# Patient Record
Sex: Male | Born: 1935 | Race: White | Hispanic: No | Marital: Married | Smoking: Former smoker
Health system: Southern US, Community
[De-identification: ages and names within clinical notes are randomized; demographics above are authoritative.]

## PROBLEM LIST (undated history)

## (undated) DIAGNOSIS — H9190 Unspecified hearing loss, unspecified ear: Secondary | ICD-10-CM

## (undated) DIAGNOSIS — G4700 Insomnia, unspecified: Secondary | ICD-10-CM

## (undated) DIAGNOSIS — E039 Hypothyroidism, unspecified: Secondary | ICD-10-CM

## (undated) DIAGNOSIS — M1990 Unspecified osteoarthritis, unspecified site: Secondary | ICD-10-CM

## (undated) DIAGNOSIS — H269 Unspecified cataract: Secondary | ICD-10-CM

## (undated) DIAGNOSIS — R0602 Shortness of breath: Secondary | ICD-10-CM

## (undated) DIAGNOSIS — I480 Paroxysmal atrial fibrillation: Secondary | ICD-10-CM

## (undated) DIAGNOSIS — E785 Hyperlipidemia, unspecified: Secondary | ICD-10-CM

## (undated) DIAGNOSIS — I10 Essential (primary) hypertension: Secondary | ICD-10-CM

## (undated) DIAGNOSIS — J209 Acute bronchitis, unspecified: Secondary | ICD-10-CM

## (undated) DIAGNOSIS — I4892 Unspecified atrial flutter: Secondary | ICD-10-CM

## (undated) HISTORY — PX: BACK SURGERY: SHX140

## (undated) HISTORY — DX: Unspecified hearing loss, unspecified ear: H91.90

## (undated) HISTORY — DX: Acute bronchitis, unspecified: J20.9

## (undated) HISTORY — PX: COLON SURGERY: SHX602

## (undated) HISTORY — DX: Insomnia, unspecified: G47.00

## (undated) HISTORY — DX: Hyperlipidemia, unspecified: E78.5

## (undated) HISTORY — DX: Unspecified cataract: H26.9

---

## 1998-11-03 ENCOUNTER — Ambulatory Visit (HOSPITAL_COMMUNITY): Admission: RE | Admit: 1998-11-03 | Discharge: 1998-11-03 | Payer: Self-pay | Admitting: Gastroenterology

## 1998-11-04 ENCOUNTER — Ambulatory Visit (HOSPITAL_COMMUNITY): Admission: RE | Admit: 1998-11-04 | Discharge: 1998-11-04 | Payer: Self-pay | Admitting: Gastroenterology

## 1998-11-04 ENCOUNTER — Encounter: Payer: Self-pay | Admitting: Gastroenterology

## 1998-11-13 ENCOUNTER — Encounter: Payer: Self-pay | Admitting: Surgery

## 1998-11-15 ENCOUNTER — Inpatient Hospital Stay (HOSPITAL_COMMUNITY): Admission: RE | Admit: 1998-11-15 | Discharge: 1998-11-20 | Payer: Self-pay | Admitting: Surgery

## 1998-11-16 ENCOUNTER — Encounter: Payer: Self-pay | Admitting: Surgery

## 1999-10-29 HISTORY — PX: COLECTOMY: SHX59

## 1999-11-29 ENCOUNTER — Ambulatory Visit (HOSPITAL_COMMUNITY): Admission: RE | Admit: 1999-11-29 | Discharge: 1999-11-29 | Payer: Self-pay | Admitting: Gastroenterology

## 2002-12-28 ENCOUNTER — Ambulatory Visit (HOSPITAL_COMMUNITY): Admission: RE | Admit: 2002-12-28 | Discharge: 2002-12-28 | Payer: Self-pay | Admitting: Gastroenterology

## 2004-01-10 ENCOUNTER — Encounter: Admission: RE | Admit: 2004-01-10 | Discharge: 2004-01-10 | Payer: Self-pay | Admitting: Cardiology

## 2004-01-16 ENCOUNTER — Inpatient Hospital Stay (HOSPITAL_COMMUNITY): Admission: RE | Admit: 2004-01-16 | Discharge: 2004-01-18 | Payer: Self-pay | Admitting: Cardiology

## 2004-01-16 HISTORY — PX: CARDIOVERSION: SHX1299

## 2004-02-22 ENCOUNTER — Ambulatory Visit (HOSPITAL_COMMUNITY): Admission: RE | Admit: 2004-02-22 | Discharge: 2004-02-22 | Payer: Self-pay | Admitting: Cardiology

## 2005-09-13 ENCOUNTER — Ambulatory Visit (HOSPITAL_COMMUNITY): Admission: RE | Admit: 2005-09-13 | Discharge: 2005-09-13 | Payer: Self-pay | Admitting: Gastroenterology

## 2006-10-28 DIAGNOSIS — I4892 Unspecified atrial flutter: Secondary | ICD-10-CM

## 2006-10-28 HISTORY — PX: ATRIAL FLUTTER ABLATION: SHX5733

## 2006-10-28 HISTORY — DX: Unspecified atrial flutter: I48.92

## 2007-09-28 ENCOUNTER — Ambulatory Visit: Payer: Self-pay | Admitting: Internal Medicine

## 2007-09-28 ENCOUNTER — Inpatient Hospital Stay (HOSPITAL_COMMUNITY): Admission: EM | Admit: 2007-09-28 | Discharge: 2007-10-01 | Payer: Self-pay | Admitting: Emergency Medicine

## 2007-09-29 ENCOUNTER — Encounter (INDEPENDENT_AMBULATORY_CARE_PROVIDER_SITE_OTHER): Payer: Self-pay | Admitting: Cardiovascular Disease

## 2008-01-11 ENCOUNTER — Encounter: Admission: RE | Admit: 2008-01-11 | Discharge: 2008-01-11 | Payer: Self-pay | Admitting: Family Medicine

## 2008-10-12 ENCOUNTER — Ambulatory Visit: Payer: Self-pay | Admitting: Internal Medicine

## 2008-10-31 ENCOUNTER — Encounter: Admission: RE | Admit: 2008-10-31 | Discharge: 2008-10-31 | Payer: Self-pay | Admitting: Cardiology

## 2008-11-04 ENCOUNTER — Ambulatory Visit (HOSPITAL_COMMUNITY): Admission: RE | Admit: 2008-11-04 | Discharge: 2008-11-04 | Payer: Self-pay | Admitting: Cardiology

## 2008-11-04 HISTORY — PX: CARDIOVERSION: SHX1299

## 2009-03-22 IMAGING — CR DG CHEST 2V
2 series · 2 of 2 positions shown · non-contrast
Comparison: 01/10/2004

CLINICAL DATA: Shortness of breath, arrhythmia

CHEST - 2 VIEW:

[w chest pa]
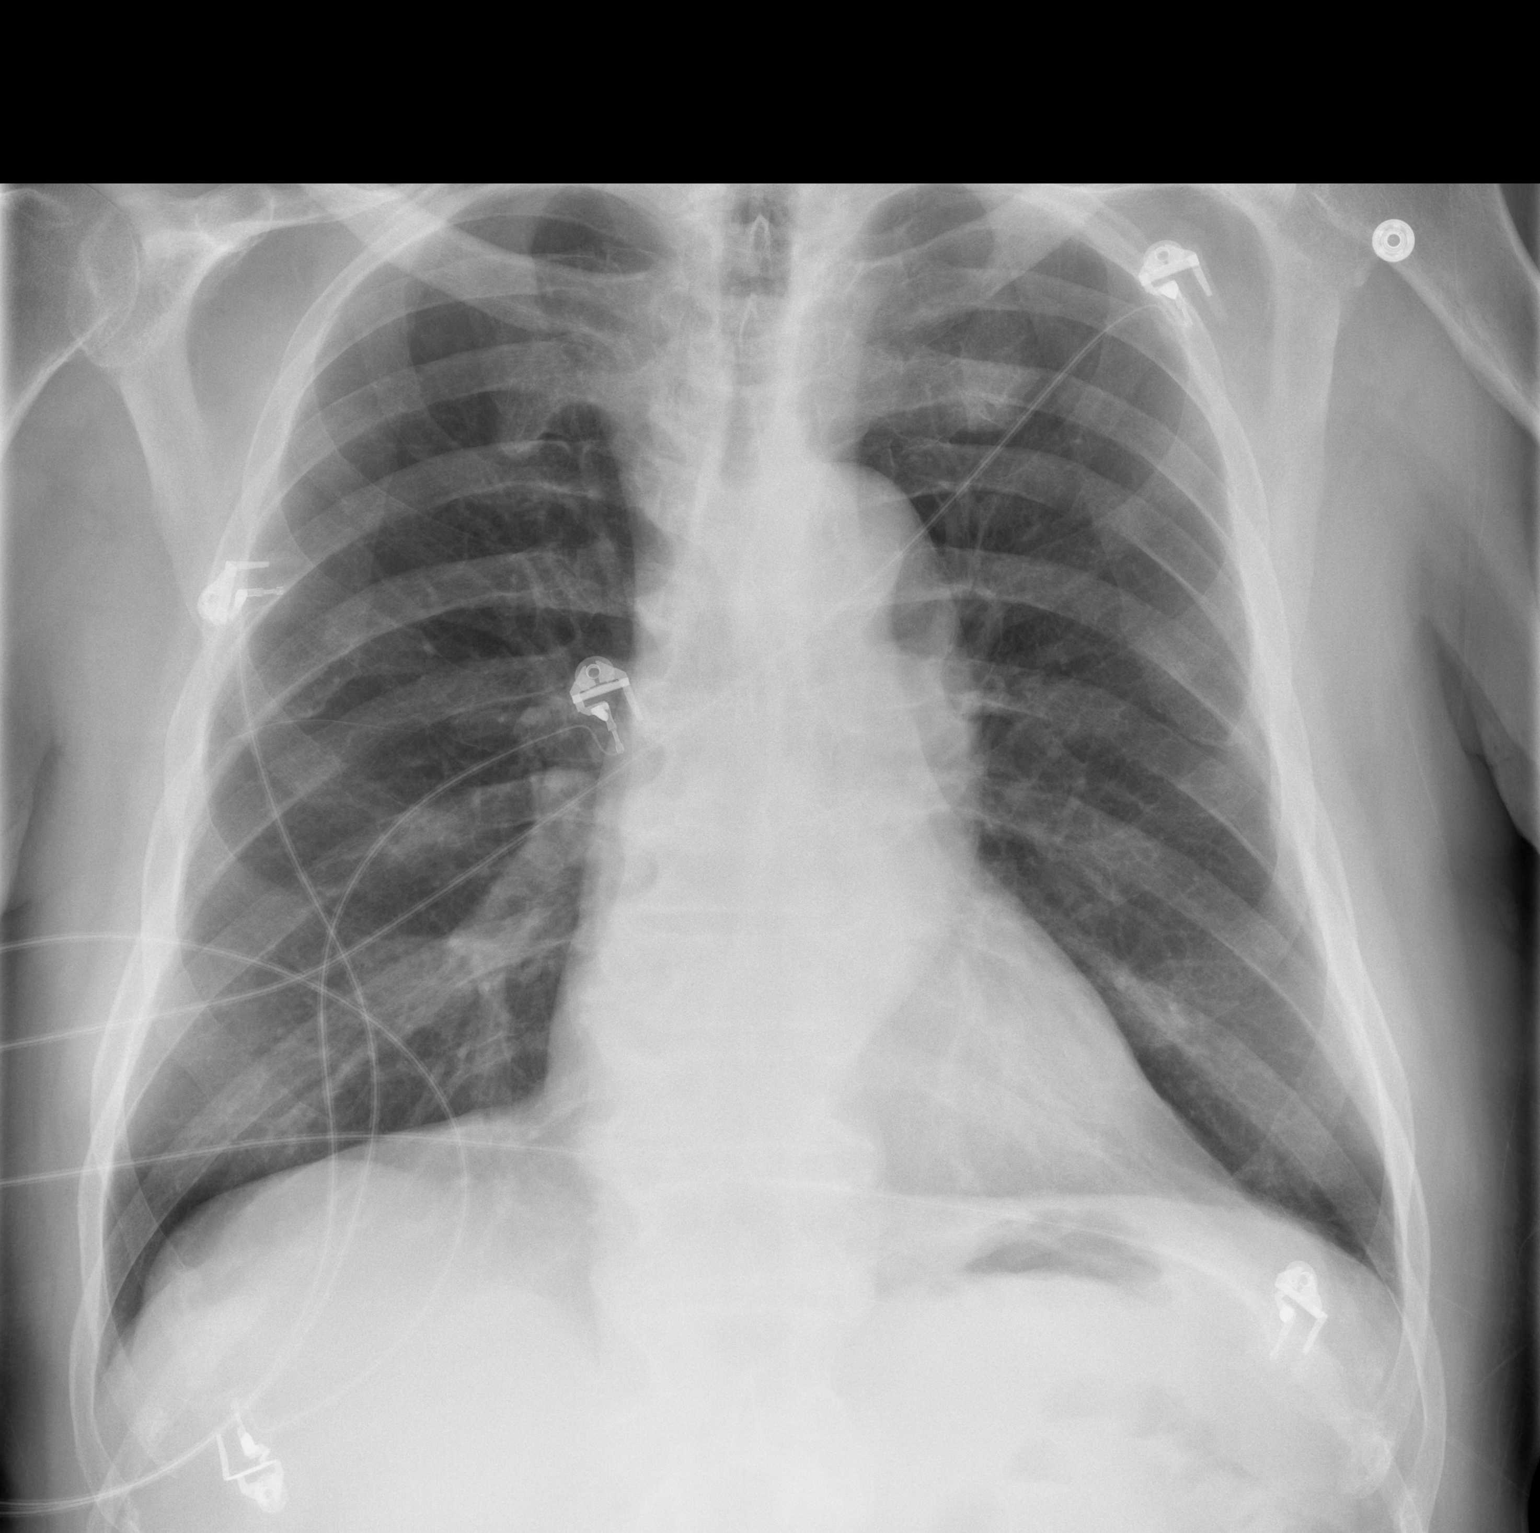

[w chest lat]
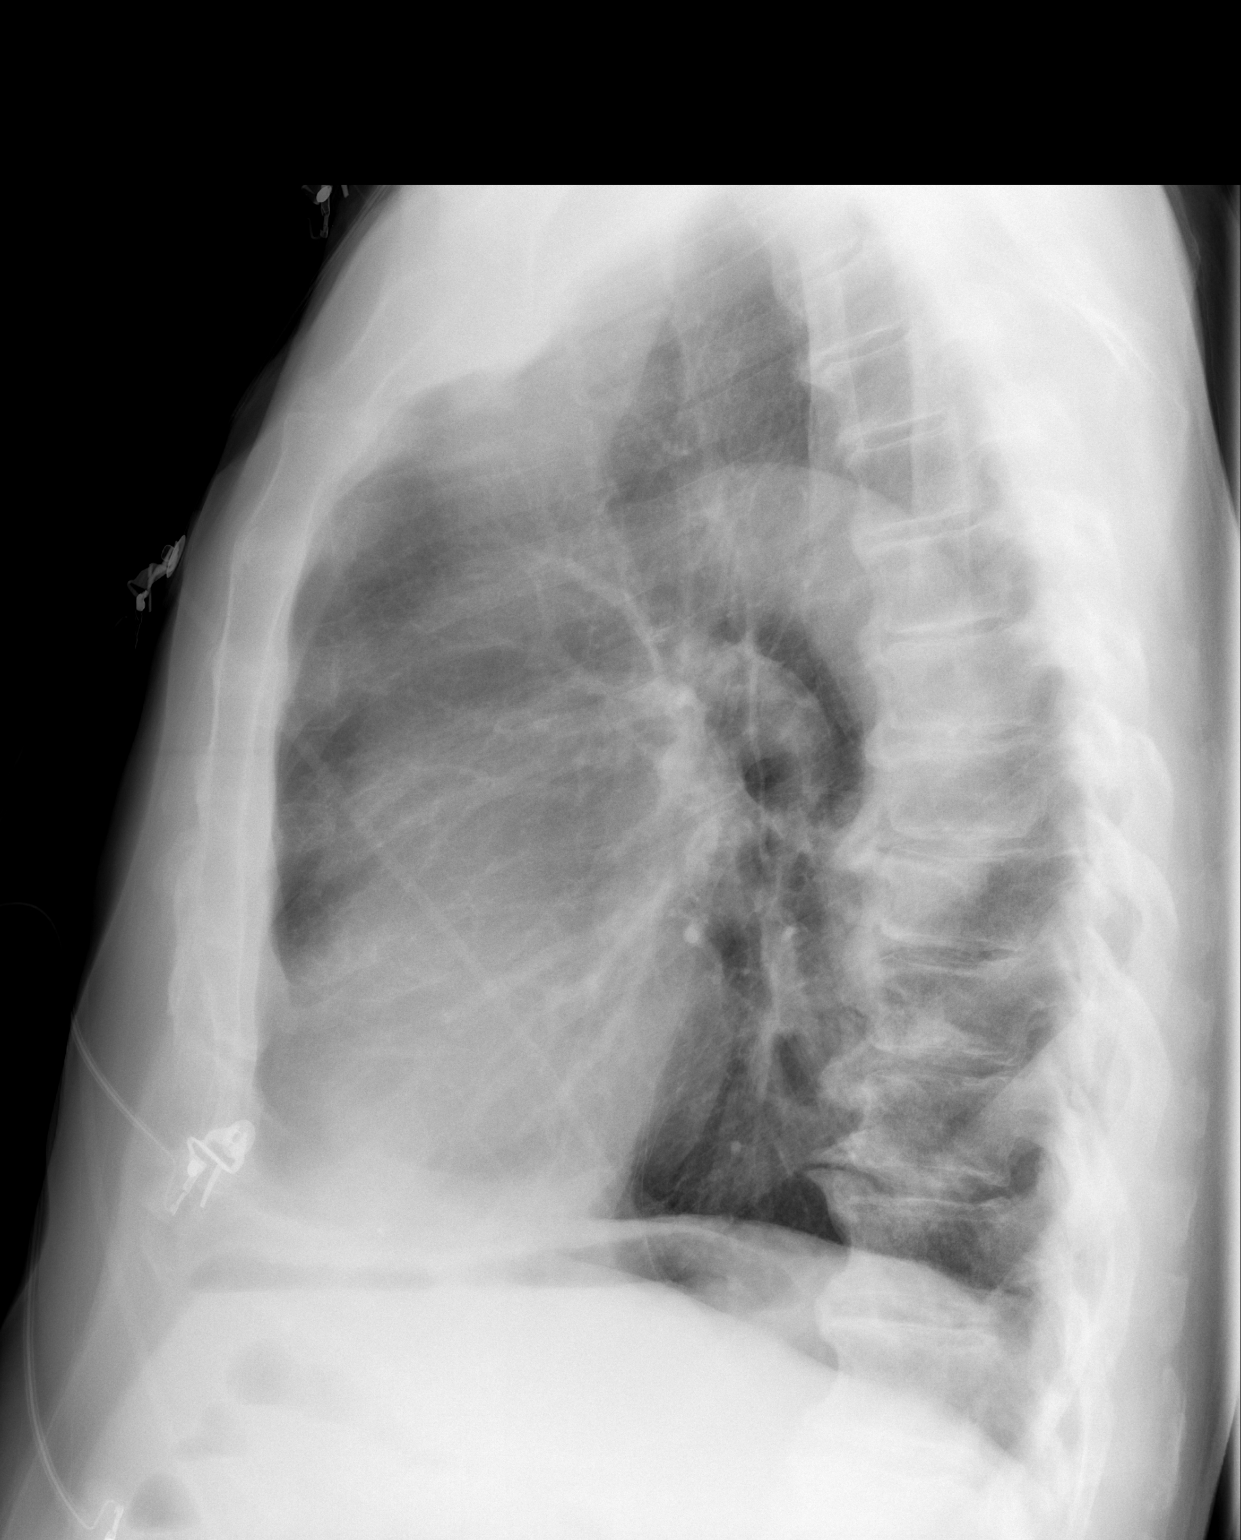

[2 of 2 positions shown; findings below may reference images not displayed]

FINDINGS: Heart is normal size. There is stable mild tortuosity of the thoracic
aorta. No focal opacities or effusions. Degenerative changes throughout the
thoracic spine.
IMPRESSION: No acute findings.

## 2009-10-28 HISTORY — PX: ATRIAL FIBRILLATION ABLATION: SHX5732

## 2010-11-20 ENCOUNTER — Ambulatory Visit: Payer: Self-pay | Admitting: Cardiology

## 2010-12-07 ENCOUNTER — Ambulatory Visit (HOSPITAL_COMMUNITY)
Admission: RE | Admit: 2010-12-07 | Discharge: 2010-12-07 | Disposition: A | Discharge status: Home / Self Care | Payer: Medicare Other | Source: Ambulatory Visit | Attending: Urology | Admitting: Urology

## 2010-12-07 ENCOUNTER — Ambulatory Visit (HOSPITAL_BASED_OUTPATIENT_CLINIC_OR_DEPARTMENT_OTHER)
Admission: RE | Admit: 2010-12-07 | Discharge: 2010-12-07 | Disposition: A | Discharge status: Home / Self Care | Payer: Medicare Other | Attending: Urology | Admitting: Urology

## 2010-12-07 ENCOUNTER — Other Ambulatory Visit (HOSPITAL_BASED_OUTPATIENT_CLINIC_OR_DEPARTMENT_OTHER): Payer: Self-pay | Admitting: Urology

## 2010-12-07 DIAGNOSIS — I10 Essential (primary) hypertension: Secondary | ICD-10-CM | POA: Insufficient documentation

## 2010-12-07 DIAGNOSIS — Z01818 Encounter for other preprocedural examination: Secondary | ICD-10-CM | POA: Insufficient documentation

## 2010-12-07 DIAGNOSIS — N401 Enlarged prostate with lower urinary tract symptoms: Secondary | ICD-10-CM | POA: Insufficient documentation

## 2010-12-07 DIAGNOSIS — R339 Retention of urine, unspecified: Secondary | ICD-10-CM | POA: Insufficient documentation

## 2010-12-07 DIAGNOSIS — Z01811 Encounter for preprocedural respiratory examination: Secondary | ICD-10-CM

## 2010-12-07 DIAGNOSIS — N320 Bladder-neck obstruction: Secondary | ICD-10-CM | POA: Insufficient documentation

## 2010-12-07 DIAGNOSIS — N138 Other obstructive and reflux uropathy: Secondary | ICD-10-CM | POA: Insufficient documentation

## 2010-12-07 DIAGNOSIS — Z01812 Encounter for preprocedural laboratory examination: Secondary | ICD-10-CM | POA: Insufficient documentation

## 2010-12-07 LAB — BASIC METABOLIC PANEL
BUN: 22 mg/dL (ref 6–23)
CO2: 26 mEq/L (ref 19–32)
Calcium: 9.6 mg/dL (ref 8.4–10.5)
Chloride: 101 mEq/L (ref 96–112)
Creatinine, Ser: 1.48 mg/dL (ref 0.4–1.5)
GFR calc Af Amer: 55 mL/min — ABNORMAL LOW (ref >60)
GFR calc non Af Amer: 45 mL/min — ABNORMAL LOW (ref >60)
Glucose, Bld: 118 mg/dL — ABNORMAL HIGH (ref 70–99)
Potassium: 3.9 mEq/L (ref 3.5–5.1)
Sodium: 134 mEq/L — ABNORMAL LOW (ref 135–145)

## 2010-12-07 LAB — GLUCOSE, CAPILLARY
Glucose-Capillary: 109 mg/dL — ABNORMAL HIGH (ref 70–99)
Glucose-Capillary: 118 mg/dL — ABNORMAL HIGH (ref 70–99)
Glucose-Capillary: 112 mg/dL — ABNORMAL HIGH (ref 70–99)

## 2010-12-07 LAB — POCT HEMOGLOBIN-HEMACUE: Hemoglobin: 11.3 g/dL — ABNORMAL LOW (ref 13.0–17.0)

## 2010-12-07 LAB — PROTIME-INR
INR: 0.96 (ref 0.00–1.49)
Prothrombin Time: 13 seconds (ref 11.6–15.2)

## 2010-12-20 NOTE — Op Note (Signed)
NAME:  Ronald Payne, Ronald Payne             ACCOUNT NO.:  [id number]  MEDICAL RECORD NO.:  [id number]           PATIENT TYPE:  O  LOCATION:  XRAY                         FACILITY:  [facility]  PHYSICIAN:  Azara Gemme C. Vernie Payne, M.D.  DATE OF BIRTH:  1923-08-02  DATE OF PROCEDURE:  12/07/2010 DATE OF DISCHARGE:                              OPERATIVE REPORT   PREOPERATIVE DIAGNOSIS:  Benign prostatic hypertrophy with bladder outlet obstruction and urinary retention.  POSTOPERATIVE DIAGNOSIS:  Benign prostatic hypertrophy with bladder outlet obstruction and urinary retention.  PROCEDURE:  Transurethral vaporization of the prostate.  SURGEON:  Lyndal Reggio C. Vernie Payne, M.D.  ANESTHESIA:  General.  SPECIMENS:  None.  ESTIMATED BLOOD LOSS:  Minimal.  DRAINS:  22-French three-way Foley catheter.  COMPLICATIONS:  None.  INDICATIONS:  The patient is an 77 year old male who initially was seen with urinary symptoms that had been present for several years and was found by urodynamics to have a normal cystometric capacity with some mild bladder instability but primarily outlet obstruction with an elevated postvoid residual of 225 cc.  He eventually developed urinary retention due to his outlet obstruction and we discussed treatment with a transurethral resection of the prostate.  I have gone over the procedure as well as its risks, complications and alternatives with the patient and he understands and has elected to proceed.  DESCRIPTION OF OPERATION:  After informed consent, the patient was brought to the major OR, placed on the table, administered general anesthesia and then moved to the dorsal lithotomy position.  His genitalia was then sterilely prepped and draped.  An official time-out was then performed.  Initially the 26-French resectoscope sheath with Timberlake obturator was introduced in the bladder and the obturator was removed.  The 12 degrees lens with resectoscope element was then inserted and  the bladder was fully and systematically inspected.  It was noted to have no tumor, stones or inflammatory lesions.  The 2+ trabeculation was identified. Ureteral orifices were identified and noted to be of normal position and configuration and well away from the bladder neck with no significant median lobe component present.  Transurethral vaporization was performed using the Gyrus generator as well as the button element and I vaporized from the level of the bladder neck back to the veru at the 6 o'clock position and then progressed in a counterclockwise position vaporizing the entire left lobe down to the surgical capsule and back to the veru.  This was performed then on the contralateral lobe of the prostate in a similar fashion.  Bleeding arterioles identified during the procedure were cauterized and then apical tissue was vaporized with care being taken to remain proximal to the veru at all times.  At the end of the procedure, the bladder was reinspected and found to be free of any perforation or injury.  The ureteral orifices were noted to be intact and well away from the bladder neck and the prostatic urethra was well resected and this was photographed.  I then cauterized any obvious venous bleeding points, as no further arterial bleeding points were identified and left the bladder full and removed the resectoscope.  In doing  this, a good flow was noted.  I then inserted the catheter, filled the balloon and connected this to closed system drainage as well as continuous irrigation at a slow rate with the irrigant remaining completely clear.  The patient was awakened and taken to the recovery room in stable and satisfactory condition.  He tolerated the procedure well and there were no intraoperative complications.  He will be observed overnight and with anticipation of discharge in the morning and hold his Coumadin for 48 hours and then restart that at home.     Ronald Payne, M.D.     MCO/MEDQ  D:  12/07/2010  T:  12/07/2010  Job:  [id number]  Electronically Signed by Ronald Gully M.D. on 12/20/2010 05:54:45 PM

## 2011-02-11 LAB — PROTIME-INR: Prothrombin Time: 40.2 seconds — ABNORMAL HIGH (ref 11.6–15.2)

## 2011-02-11 LAB — GLUCOSE, CAPILLARY: Glucose-Capillary: 123 mg/dL — ABNORMAL HIGH (ref 70–99)

## 2011-03-12 NOTE — Op Note (Signed)
Ronald Payne, Ronald NO.:  [id number]   MEDICAL RECORD NO.:  [id number]          PATIENT TYPE:  INP   LOCATION:  2008                         FACILITY:  [facility]   PHYSICIAN:  Ronald Canning. Ladona Ridgel, MD    DATE OF BIRTH:  [birth year]   DATE OF PROCEDURE:  09/30/2007  DATE OF DISCHARGE:                               OPERATIVE REPORT   PROCEDURE PERFORMED:  Electrophysiologic study and radiofrequency  catheter ablation of atrial flutter.   I. INTRODUCTION:  The patient is an 77 year old male with a history of  symptomatic atrial flutter as well as a history of atrial fibrillation,  who was admitted to the hospital with worsening atrial flutter and a  rapid ventricular response.  He spontaneously returned to sinus rhythm  and is now referred for catheter ablation.   II. PROCEDURE:  After informed was obtained, the patient was taken to  the diagnostic EP lab in the fasting state.  After the usual preparation  and draping, intravenous fentanyl and midazolam were given for sedation.  A 6-French Hexapolar catheter was inserted percutaneously in the right  jugular vein and advanced into the coronary sinus under fluoroscopic  guidance.  A 7-French 20-pole halo catheter was inserted percutaneously  in the right femoral vein and advanced into the right atrium under  fluoroscopic guidance.  A 5-French quadripolar catheter was inserted  percutaneously in the right femoral vein and advanced to the His bundle  region under fluoroscopic guidance.  After measurement of the basic  intervals, rapid atrial pacing was carried out from the coronary sinus  and stepwise decreased down to 400 milliseconds, where A-V Wenckebach  was observed.  Next, programmed atrial stimulation was carried out from  the coronary sinus and the high right atrium at basic drive cycle  lengths of 161 milliseconds.  The S1-S2 interval was stepwise decreased  down to 280 milliseconds, where the atrial refractoriness  was observed.  During programmed atrial stimulation, there were no A-H jumps and no  inducible SVT.  Next, additional rapid atrial pacing was carried out  from the coronary sinus at 290 milliseconds and stepwise decreased down  to 230 milliseconds, where atrial flutter was induced.  Mapping of the  atrial flutter demonstrated typical counterclockwise tricuspid annular  reentry.  The 7-French quadripolar ablation catheter was then inserted  percutaneously in the right femoral vein and advanced into the right  atrium.  Additional mapping was then carried out under fluoroscopic  guidance.  Two RF energy applications were then delivered to the atrial  flutter isthmus, resulting in termination of atrial flutter and  restoration of sinus rhythm.  There appeared to be isthmus block.  A  bonus RF energy application was then delivered and the patient was  observed for approximately 30 minutes during this time; there was no  additional atrial flutter isthmus conduction.  During this time, rapid  ventricular pacing was carried out from the RV apex, demonstrating V-A  dissociation.  Programmed ventricular stimulation was carried out from  the RV apex, demonstrating V-A dissociation.  The catheters were then  removed, hemostasis was assured and the  patient was returned to his room  in satisfactory condition.   III. COMPLICATIONS:  There were no immediate procedural complications.   IV. RESULTS:  A.  Baseline ECG:  The baseline ECG demonstrates sinus  rhythm with normal axis and intervals.  B.  Baseline intervals:  The sinus node cycle length was 1000  milliseconds.  The QRS duration was 117 milliseconds.  The H-V interval  was 40 milliseconds.  C.  Rapid ventricular pacing:  Rapid ventricular pacing was carried out  following ablation, demonstrating V-A dissociation.  D.  Programmed ventricular stimulation:  Programmed ventricular  stimulation was carried out following ablation, demonstrating V-A   dissociation.  E.  Rapid atrial pacing:  Rapid atrial pacing was carried out from the  coronary sinus and the high right atrium at pacing cycle length of 500  milliseconds and stepwise decreased down to 400 milliseconds, where A-V  Wenckebach was observed.  Additional decrements down to 220 milliseconds  resulted in the initiation of atrial flutter.  F.  Programmed atrial stimulation:  Programmed atrial stimulation was  carried out from the coronary sinus and the high right atrium at basic  drive cycle length of 161 milliseconds.  The S1-S2 interval was stepwise  decreased down to 280 milliseconds, where atrial refractoriness was  observed.  During programmed atrial stimulation, there were no A-H jumps  and no echo beats noted.  G.  Arrhythmias observed:  1. Atrial flutter.  Initiation:  Rapid atrial pacing.  Duration was      sustained.  Termination was with catheter ablation.  Cycle length      was 260 milliseconds.      a.     Mapping:  Mapping demonstrated typical counterclockwise       tricuspid annular reentrant atrial flutter.  The atrial flutter       isthmus was about average size.      b.     RF energy application:  A total of 3 RF energy applications       were delivered at the usual atrial flutter isthmus, resulting in       termination of atrial flutter, restoration of sinus rhythm,       creation of bidirectional block in the atrial flutter isthmus.      Ronald Canning. Ladona Ridgel, MD  Electronically Signed     GWT/MEDQ  D:  09/30/2007  T:  09/30/2007  Job:  [id number]   cc:   Thereasa Solo. Little, M.D.  Mosetta Putt, M.D.

## 2011-03-12 NOTE — Discharge Summary (Signed)
Ronald Payne, Ronald Payne             ACCOUNT NO.:  [id number]   MEDICAL RECORD NO.:  [id number]          PATIENT TYPE:  INP   LOCATION:  2008                         FACILITY:  [facility]   PHYSICIAN:  Richard A. Alanda Amass, M.D.DATE OF BIRTH:  11-Dec-1922   DATE OF ADMISSION:  09/28/2007  DATE OF DISCHARGE:  10/01/2007                               DISCHARGE SUMMARY   [name] is an 77 year old white married male patient who was admitted  from the office after being seen secondary to atrial flutter with rapid  ventricular response, symptomatic with complaints of shortness of  breath, inability to sleep because of his tachy palpitations.  He was  admitted.  He was put on IV amiodarone.  He had had a prior history of  PA fib or flutter in the past with history of remote DC cardioversion in  2005.  He has been on amiodarone since.  He was put on an IV amiodarone  drip when he was admitted.  EP consult was called for possible ablation.  He was seen by Dr. Graciela Husbands.  He went on to have an ablation done on  September 29, 2007.  Procedure was actually done by Dr. Ladona Ridgel.  It was  successful.  Post procedure, he had no problems, no complications.  On  October 01, 2007, he was seen by Dr. Alanda Amass.  Apparently Dr. Ladona Ridgel  decided to keep him on his amiodarone.  He was considered stable for  discharge home.  He did have some problems with of intermittent  hematuria, thus a PSA was ordered prior to his discharge, which will be  followed up as an outpatient.  We will also need to do a repeat UA as an  outpatient in the future.  His Coumadin was therapeutic with an INR of  2.6 on October 01, 2007.   LABORATORY DATA:  Hemoglobin 10.2, hematocrit 29.4 platelets 211.  Sodium 135, potassium 3.9, chloride 101, CO2 26, BUN 16, creatinine  1.49, glucose 126. Coags with INR 2.6, admission was 2.6.  He was  therapeutic throughout his hospitalization.  AST was 21, ALT was 18,  albumin was 3.6.  CK-MB and troponin  were negative.  Total cholesterol  is 128, LDL was 72, HDL was 46, triglycerides were 50.  Calcium 9.7.  UA  was negative.  Iron 64, TIBC was 248, percent saturation was 26.  Free  T4 was 2.29, T3 total was 118.6.  Vitamin B12 was 369.  Glycosylated  hemoglobin was 6.2.  TSH was low at 0.024.  It appears that the possibly  somewhere around September, he had been placed on Levoxyl by his primary  care doctor.  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  In the hospital here, with his TSH being low, his  thyroid medication was held and it was decided at the time of discharge  to again lower the dosage which it had been lowered by his primary care  physician as an outpatient.  The patient cannot remember the last time  it had been lowered.   DISCHARGE MEDICATIONS:  1. Lovastatin 40 mg every  day.  2. Finasteride 5 mg every day.  3. Cozaar 25 mg every day.  4. Metformin 850 mg twice a day.  5. Amiodarone 200 mg a day.  6. Coumadin 5 mg Monday, Wednesday, Friday and Saturday.  7. Coumadin 2.5 mg Tuesday, Thursday, and Sunday.  8. Chlorthalidone 12.5 mg a day.  9. Levoxyl 25 mg a day.  10.Colace three times a day.  11.Ferrous sulfate 324 mg a day.   DISCHARGE DIAGNOSES:  1. Atrial flutter with rapid ventricular response.  2. Status post right femoral artery ablation.  3. Normal ejection fraction with an ejection fraction of 55%.  4. Anticoagulation long term secondary to his PA fibrillation/flutter.  5. Non-insulin-dependent diabetes mellitus.  6. 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  7. Benign prostatic hypertrophy.  8. Anemia, currently on iron.  9. Hematuria, PSA ordered in the hospital, results pending at the time      of discharge.  His urinalysis was negative.  Both will need to be      followed up as an outpatient. He was told not to do any heavy      lifting, strenuous activity for a week, and he will follow up with      Dr.  Ladona Ridgel and Dr. Clarene Duke as an outpatient.      Lezlie Octave, N.P.      Richard A. Alanda Amass, M.D.  Electronically Signed    BB/MEDQ  D:  10/01/2007  T:  10/01/2007  Job:  [id number]   cc:   Mosetta Putt, M.D.  Doylene Canning. Ladona Ridgel, MD

## 2011-03-12 NOTE — Cardiovascular Report (Signed)
NAME:  Ronald Payne, LIPS NO.:  [id number]   MEDICAL RECORD NO.:  [id number]          PATIENT TYPE:  OIB   LOCATION:  2899                         FACILITY:  [facility]   PHYSICIAN:  Thereasa Solo. Little, M.D. DATE OF BIRTH:  1923-05-28   DATE OF PROCEDURE:  11/04/2008  DATE OF DISCHARGE:  11/04/2008                            CARDIAC CATHETERIZATION   INDICATIONS FOR TEST:  This 77 year old male has been in atrial  fibrillation since late November.  He has complained of tiredness, some  fatigue, and a substantial drop in his exercise tolerance.  His left  atrial dimension is 4.4 cm.  His ejection fraction is about 45%.  He had  a negative nuclear study, December 2009.  He has pulmonary artery  pressure of 30.  His INR today is 3.7.   After obtaining informed consent, the patient was given 120 mg of IV  Pentothal.  After appropriate level of anesthesia was obtained, he was  cardioverted electively with a 120 watt second using anterior-posterior  pads.  He converted to a sinus rhythm with a rate of 58.   He will be discharged to home later today, follow up in my office on  Monday.  He will be changed from amiodarone to dronedarone.           ______________________________  Thereasa Solo. Little, M.D.     ABL/MEDQ  D:  11/04/2008  T:  11/05/2008  Job:  [id number]   cc:   Mosetta Putt, M.D.  Catheterization Laboratory  Gaspar Garbe B. Little, M.D.

## 2011-03-12 NOTE — Letter (Signed)
October 12, 2008    Thereasa Solo. Little, MD  [address]  [address]  [place], [place] [address]   RE:  Ronald, Payne  MRN:  [id number]  /  DOB:  07/02/1923   Dear Dr. Clarene Duke:   It was my pleasure to see your patient, Ronald Payne, in  electrophysiology consultation today regarding therapeutic strategies  for atrial fibrillation.  As you are aware, Ronald Payne is a very  pleasant 77 year old gentleman with a history of persistent atrial  fibrillation, diabetes, and hypertension.  He was initially diagnosed  with atrial fibrillation in 2005.  He was initiated on amiodarone at  that time.  He did well initially, but subsequently developed thyroid  disease.  His amiodarone was therefore discontinued.  He was  cardioverted in January 26, 2004, but maintains sinus rhythm thereafter.  He subsequently developed atrial flutter and underwent cava tricuspid  isthmus ablation by Lewayne Bunting, September 30, 2007.  He reports doing  well until September 2009 when he developed symptoms of progressive  shortness of breath and dyspnea with moderate activities.  He was  evaluated again in your office and found to have returned to atrial  fibrillation.  He has been managed initially with a rate control  strategy.  It appears that his heart rate has been well controlled.  A  recent Holter monitor placed by you recorded atrial fibrillation with  heart rates between 40 and 100 beats per minute with an average heart  rate of 74 beats per minute.  At home, the patient finds that his heart  rate is typically between 60 and 80 beats per minute.  He was  subsequently initiated on amiodarone in mid August.  He is chronically  anticoagulated with Coumadin and reports therapeutic INRs.  He has not  converted to sinus rhythm despite amiodarone therapy and continues to  have difficulties with decreased exercise tolerance and fatigue.  He was  evaluated by Sherren Kerns at [facility] several months ago, who  recommended  medical therapy in long term.  He now presents for second  opinion regarding therapeutic strategies of atrial fibrillation.   PAST MEDICAL HISTORY:  1. Persistent atrial fibrillation (as above).  2. Atrial flutter status post cava tricuspid isthmus ablation by Lewayne Bunting December 2008.  3. Hypertension.  4. Hyperlipidemia.  5. Diabetes mellitus.  6. Hypothyroidism.  7. Status post partial colectomy.  8. Benign prostatic hypertrophy.  9. Status post cataract surgery.   Transthoracic echocardiogram performed July 21, 2008, revealed a  left ventricular ejection fraction of 35-45% with a left ventricular end-  diastolic dimension of 51 cm and a left atrial size of 44 cm.  Mild-to-  moderate mitral regurgitation was also noted with a right ventricular  systolic pressure of 30-40 mmHg.   Lexiscan Myoview October 03, 2008, reveals normal perfusion except for  diaphragmatic attenuation.  This study was felt to be in low risk.   CURRENT MEDICATIONS:  1. Lovastatin 40 mg daily.  2. Finasteride 5 mg daily.  3. Metformin 500 mg b.i.d.  4. Omeprazole 40 mg daily.  5. Levoxyl 50 mcg daily.  6. Colace 100 mg t.i.d.  7. Chlorthalidone 12.5 mg daily.  8. Digoxin 0.125 mg daily.  9. Carvedilol 12.5 mg b.i.d.  10.Amiodarone 200 mg daily.  11.Coumadin to maintain an INR between 2-3.  12.Temazepam 15 mg p.r.n.   ALLERGIES:  No known drug allergies.   SOCIAL HISTORY:  The patient lives in  [place] with his wife.  He  retired previously from [employer] and subsequently from a company in  [place] in [place].  He smoked cigars but quit 2 years ago.  He  denies alcohol or drug use.   FAMILY HISTORY:  Notable for coronary artery disease.   REVIEW OF SYSTEMS:  All systems are reviewed and negative except as  outlined in the HPI above.   PHYSICAL EXAMINATION:  VITAL SIGNS:  Blood pressure 136/80, heart rate  74, respirations 18, weight 209 pounds.  GENERAL:  The patient is  an elderly male in no acute distress.  He is  alert and oriented x3.  HEENT:  Normocephalic, atraumatic.  Sclerae are clear.  Conjunctivae  pink.  Oropharynx clear.  NECK:  Supple.  No JVD, lymphadenopathy, or bruits.  LUNGS:  Clear to auscultation bilaterally.  HEART:  Irregularly irregular rhythm.  No murmurs, rubs, or gallops.  GI:  Soft, nontender, nondistended.  Positive bowel sounds.  EXTREMITIES:  No clubbing, cyanosis, trace lower extremity edema  bilaterally.  NEUROLOGIC:  Strength and sensation are intact.  SKIN:  No ecchymosis or lacerations.  MUSCULOSKELETAL:  No deformity or atrophy.  PSYCH:  Euthymic mood, full affect.   EKG atrial fibrillation with a ventricular rate of 75 beats per minute  with nonspecific ST/T-wave changes.   IMPRESSION:  Ronald Payne is a very pleasant 77 year old gentleman with  symptomatic persistent atrial fibrillation who presents today for EP  consultation.  The patient appears to have symptoms of decreased  exercise capacity and fatigue secondary to atrial fibrillation.  His  heart rate is well controlled.  He is currently anticoagulated with  Coumadin.   PLAN:  Therapeutic strategies for atrial fibrillation including both  medicine and catheter-based therapies were discussed in detail with the  patient today.  Risks, benefits, and alternatives to EP study and  radiofrequency ablation were also discussed in detail.  These risks  include but are not limited to stroke, bleeding, vascular damage,  pericardial effusion with tamponade, myocardiac perforation, renal  failure; damage to the esophagus, lungs, and surrounding structures;  pulmonary vein stenosis; and death.  The patient also understands that  his risks for vascular damage and cardiac perforation are likely  increased due to his age.  At the present time, I think that our  strategy should continue to be medical therapy.  However, should the  patient fail medical therapy and wish to  accept the risks of catheter  ablation, then we could consider catheter ablation after medical  therapies have been exhausted.  I think that we should plan for DC  cardioversion in the next few days, as the patient has been adequately  loaded with amiodarone since September.  Amiodarone is our strongest  antiarrhythmic regarding therapy for atrial fibrillation.  If we are  able to maintain sinus rhythm with amiodarone, then I think that this  would be a reasonable strategy long term.  However, should the patient  have difficulties with side effects of amiodarone, then I think that we  should consider dronedarone therapy.  Dronedarone has been studied in  the elderly population and appears to have the same safety profile as in  younger patients.  In [place], the patient ranged in age from 23-97 and  42% of the patient's were 75 years of age or older.  In [place] and  [place], the patient ranged in age from 76-48 years of age.  To date, no  increased adverse events have been found in  this patient population with  dronedarone therapy.  I have again stressed the importance of long-term  anticoagulation with Coumadin with the patient today.   Thank you for the opportunity of participating in the care of Mr.  Loberg.  Please feel free to contact me if you wish to discuss his care  further.    Sincerely,      Hillis Range, MD  Electronically Signed    JA/MedQ  DD: 10/12/2008  DT: 10/13/2008  Job #: [id number]   CC:    Bertram Millard. Hyacinth Meeker, M.D.

## 2011-03-15 NOTE — Cardiovascular Report (Signed)
NAME:  Ronald Payne, Ronald Payne                       ACCOUNT NO.:  [id number]   MEDICAL RECORD NO.:  [id number]                   PATIENT TYPE:  OIB   LOCATION:  2857                                 FACILITY:  [facility]   PHYSICIAN:  Thereasa Solo. Little, M.D.              DATE OF BIRTH:  02/28/1923   DATE OF PROCEDURE:  01/16/2004  DATE OF DISCHARGE:                              CARDIAC CATHETERIZATION   HISTORY OF PRESENT ILLNESS:  Mr. Thieme is a 77 year old male who has  hypertension and diabetes. He has had atrial fibrillation since January and  has been on anticoagulants since that time. He has had a relatively well  controlled ventricular response with a therapeutic INR for greater than 3 to  4 weeks. He was brought in for outpatient cardioversion.   DESCRIPTION OF PROCEDURE:  With anesthesia's assistance, the patient was  anesthetized using Pentothal. Initial blood pressure 130/998, pulse 107.  Using anterior, posterior pads, 200 watt seconds, 150 watt seconds, and 200  watt seconds resulted in the patient staying in atrial fibrillation. I  repositioned the anterior patch. An additional 200 watt seconds was  delivered, still remaining in atrial fibrillation. Paddles were used at this  point, 120 watt seconds atrial fibrillation, 200 watt seconds still atrial  fibrillation. After 6 attempts, the patient was still in atrial  fibrillation. At this point, I have made the decision to hospitalize him,  load him with IV Amiodarone and will try to bring him back in about a month  for cardioversion on Amiodarone. His left atrial dimension is dilated at 4.9  cm. He has left ventricular hypertrophy with a 40% ejection fraction.   RECOMMENDATIONS:  It may be that with his left atrial dilatation, we may not  be able to maintain sinus rhythm in this patient. However, will give every  attempt with Amiodarone. I have also ordered repeat TSH and pulmonary  function studies, with and without bronchodilators  and effusion capacities.                                               Thereasa Solo. Little, M.D.    ABL/MEDQ  D:  01/16/2004  T:  01/18/2004  Job:  [id number]   cc:   Mosetta Putt, M.D.  [address] [place]  [place] [address]  Fax: [phone]

## 2011-03-15 NOTE — Op Note (Signed)
NAME:  Ronald Payne, Ronald Payne             ACCOUNT NO.:  [id number]   MEDICAL RECORD NO.:  [id number]          PATIENT TYPE:  AMB   LOCATION:  ENDO                         FACILITY:  [facility]   PHYSICIAN:  James L. Malon Kindle., M.D.DATE OF BIRTH:  1923/04/19   DATE OF PROCEDURE:  09/13/2005  DATE OF DISCHARGE:                                 OPERATIVE REPORT   PROCEDURE:  Esophagogastroduodenoscopy.   MEDICATIONS GIVEN:  Cetacaine spray, Fentanyl 20 mcg, Versed 4 mg IV.   INDICATIONS FOR PROCEDURE:  Anemia, heme positive stool.   DESCRIPTION OF PROCEDURE:  The procedure was explained to the patient and  consent obtained.  With the patient in the left lateral decubitus position,  the Olympus scope was inserted and advanced.  The stomach was entered, the  pylorus identified and passed.  The duodenum including the bulb and second  duodenum were seen well and were unremarkable.  The scope was withdrawn back  into the stomach and pyloric channel.  The antrum and body were seen well  and were normal.  The fundus and cardia were seen on the retroflex view and  were normal.  The scope was withdrawn.  There were several prominent nodular  areas in the esophagus that appeared submucosa.  No other abnormalities were  seen.  No ulceration or inflammation.  The oropharynx was normal.  The scope  was withdrawn.  The patient tolerated the procedure well.   ASSESSMENT:  Anemia and heme positive stool with negative endoscopy, 280.0,  792.1.   PLAN:  Proceed with colonoscopy at this time.           ______________________________  Llana Aliment Malon Kindle., M.D.     Waldron Session  D:  09/13/2005  T:  09/13/2005  Job:  [id number]   cc:   Mosetta Putt, M.D.  Fax: [phone]   Thereasa Solo. Little, M.D.  Fax: [phone]

## 2011-03-15 NOTE — Discharge Summary (Signed)
NAME:  Ronald Payne, STEMM                       ACCOUNT NO.:  [id number]   MEDICAL RECORD NO.:  [id number]                   PATIENT TYPE:  INP   LOCATION:  4729                                 FACILITY:  [facility]   PHYSICIAN:  Thereasa Solo. Little, M.D.              DATE OF BIRTH:  [birth year]   DATE OF ADMISSION:  01/16/2004  DATE OF DISCHARGE:  01/18/2004                                 DISCHARGE SUMMARY   ADMISSION DIAGNOSES:  1. Atrial fibrillation.  2. Hypertension.  3. Adult onset diabetes mellitus, non-insulin dependent.   DISCHARGE DIAGNOSES:  1. Atrial fibrillation.  2. Hypertension.  3. Adult onset diabetes mellitus, non-insulin dependent.   PROCEDURE:  1. Attempted cardioversion January 16, 2004.  2. Amiodarone loading in hospital.   HISTORY OF PRESENT ILLNESS:  Briefly, the patient is a 77 year old white  male with new atrial fibrillation with onset January 2005.  Echocardiogram  showed a left atrium at 4.9 cm with ejection fraction of 40%.  The patient  was anticoagulated and underwent attempted cardioversion on the day of  admission.  As an outpatient he failed and was subsequently admitted after  six shock attempts failed to convert him.  He was admitted for loading of  amiodarone.   PAST MEDICAL HISTORY:  Includes hypertension, adult onset diabetes mellitus,  partial bowel resection for polyps.   ALLERGIES:  None.   SOCIAL HISTORY:  TOBACCO:  None.  ETOH:  None.   CURRENT MEDICATIONS:  1. Cozaar 50 mg b.i.d.  2. Lasix 40 mg daily.  3. Glucophage 850 mg b.i.d.  4. Proscar 5 mg daily.  5. Lovastatin 10 mg daily.  6. Atenolol 50 mg 1.5 tablets daily.   For further history and physical examination findings please see the  hospital note.   HOSPITAL COURSE:  The patient was admitted as an outpatient and Dr. Clarene Duke  attempted to cardiovert him after six shocks going from 120 to 200 watt  seconds.  He was unable to cardiovert the patient.  The patient was  subsequently admitted and underwent amiodarone loading.  He has had no  problems and was subsequently discharged home on January 18, 2004.   DISCHARGE MEDICATIONS:  1. Atenolol 50 mg 1.5 tablets daily.  2. Coumadin 5 mg daily on Monday, Wednesday, Thursday, Saturday and Sunday,     2.5 mg on Friday and Tuesday.  3. Amiodarone 200 mg two tablets daily.  4. Cozaar 50 mg daily.  5. Lasix 20 mg daily.  6. Glucophage 850 mg one twice a day.  7. Lovastatin 10 mg daily.  8. Mag-OX 400 mg one twice daily.  9. Proscar 5 mg daily.   FOLLOW UP:  Patient is scheduled to have an INR check on the following  Tuesday and the patient is to return to the office on January 30, 2004 to see  Dr. Clarene Duke with plans to reduce his amiodarone dose on his office visit at  that time.  During his hospitalization PFT's were found to be normal and his  TSH was normal.      Eber Hong, P.A.                 Thereasa Solo. Little, M.D.    WDJ/MEDQ  D:  03/01/2004  T:  03/02/2004  Job:  [id number]

## 2011-03-15 NOTE — H&P (Signed)
NAME:  Ronald Payne, Ronald Payne                       ACCOUNT NO.:  [id number]   MEDICAL RECORD NO.:  [id number]                   PATIENT TYPE:  OIB   LOCATION:  4729                                 FACILITY:  [facility]   PHYSICIAN:  Thereasa Solo. Little, M.D.              DATE OF BIRTH:  08/23/1923   DATE OF ADMISSION:  01/16/2004  DATE OF DISCHARGE:                                HISTORY & PHYSICAL   CHIEF COMPLAINT:  Atrial fibrillation.   BRIEF HISTORY:  The patient is a 77 year old white male, a medical patient  of Dr. Mosetta Putt, who was referred in January, 2005, with new onset  atrial fibrillation.  Workup included 2D echo which showed a left atrium of  4.9 cm, EF of 40%, otherwise, normal.  He was anticoagulated and was  admitted today for cardioversion. The patient underwent six attempts at  cardioversion which failed.  He has been anticoagulated since January, 2005,  and it was Dr. Fredirick Maudlin opinion that he should be admitted for amiodarone  loading and further treatment as indicated.   PAST MEDICAL HISTORY:  1. Hypertension.  2. Adult onset diabetes mellitus.  3. He had a partial bowel resection a number of years ago for polyps.   ALLERGIES:  None known.   TOBACCO:  He smoked cigars from age 60 up until about 1 month ago.   ETOH:  Heavy use in the [employer].  He is a retired Manufacturing engineer.   CURRENT MEDICATIONS:  1. Cozaar 50 mg q.d.  2. Lasix 20 mg q.d.  3. Glucophage 850 mg b.i.d. a.c.  4. Proscar 5 mg q.d.  5. Lovastatin 10 mg q.d.  6. Atenolol 50 mg 1/2 tablet q.d.  7. Mag oxide 400 mg b.i.d.   FAMILY HISTORY:  Noncontributory.   REVIEW OF SYSTEMS:  Negative.   PHYSICAL EXAMINATION:  GENERAL:  Well-nourished, well-developed, white male  in no acute distress.  VITAL SIGNS:  Temperature was 97.6, heart rate was 114, respirations 18,  blood pressure is 126/88.  HEENT:  Within normal limits.  NECK:  No bruits.  No JVD.  No thyromegaly.  CHEST:  Clear to auscultation and  percussion.  HEART:  Normal S1, tachycardic, irregular.  ABDOMEN:  Soft, nontender, positive bowel sounds.  No palpable  hepatosplenomegaly.  GU/RECTAL:  Deferred.  EXTREMITIES:  Lower extremities:  No clubbing, cyanosis, or edema.  Pulses  are +2 and equal bilaterally.  NEUROLOGIC:  Grossly within normal limits.  He is currently awakening from  his anesthesia.   IMPRESSION:  1. Atrial fibrillation with failed cardioversion.  2. Hypertension.  3. Adult onset diabetes mellitus, non-insulin dependent.  4. History of partial bowel resection for polyps.   PLAN:  The patient is admitted for amiodarone load.  We will plan to check  his thyroid and pulmonary function studies prior to discharge as a baseline.  We will continue his Coumadin, but we will modify the dose  as pro-times  indicate.      Eber Hong, P.A.                 Thereasa Solo. Little, M.D.    WDJ/MEDQ  D:  01/16/2004  T:  01/17/2004  Job:  [id number]   cc:   Mosetta Putt, M.D.  [address] [place]  [place] [address]  Fax: [phone]

## 2011-03-15 NOTE — Op Note (Signed)
   NAME:  Ronald Payne, Ronald Payne                       ACCOUNT NO.:  [id number]   MEDICAL RECORD NO.:  [id number]                   PATIENT TYPE:  AMB   LOCATION:  ENDO                                 FACILITY:  [facility]   PHYSICIAN:  James L. Malon Kindle., M.D.          DATE OF BIRTH:  1923-07-02   DATE OF PROCEDURE:  12/28/2002  DATE OF DISCHARGE:                                 OPERATIVE REPORT   PROCEDURE:  Colonoscopy.   MEDICATIONS:  Fentanyl 50 mcg, Versed 5 mg IV.   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  This is done as a three-  year follow-up.  A colonoscopy immediately a year afterwards was negative as  well.   DESCRIPTION OF PROCEDURE:  The procedure had been explained to the patient  and consent obtained.  The patient was placed in the left lateral decubitus  position.  The Olympus scope was inserted and advanced under direct  visualization.  The prep was excellent.  We reached the anastomosis without  difficulty.  The ileocecal valve and appendiceal orifice were seen.  The  scope was withdrawn, and the anastomosis, hepatic flexure, transverse,  descending, and sigmoid colon were seen well.  There was no significant  diverticular disease.  No polyps were seen throughout.  The rectum was free  of polyps.  The scope was withdrawn.  The patient tolerated the procedure  well.   ASSESSMENT:  No evidence of further polyps.   PLAN:  Will check the stool yearly for blood and recommend repeating a  colonoscopy in five years.                                                James L. Malon Kindle., M.D.    Waldron Session  D:  12/28/2002  T:  12/28/2002  Job:  [id number]   cc:   Mosetta Putt, M.D.  [address] [place]  [place] [address]  Fax: [phone]   Currie Paris, M.D.  [address]  [place]  [place] [address]  Fax: [phone]

## 2011-03-15 NOTE — Op Note (Signed)
NAME:  Ronald Payne, Ronald Payne             ACCOUNT NO.:  [id number]   MEDICAL RECORD NO.:  [id number]          PATIENT TYPE:  AMB   LOCATION:  ENDO                         FACILITY:  [facility]   PHYSICIAN:  James L. Malon Kindle., M.D.DATE OF BIRTH:  1923-07-14   DATE OF PROCEDURE:  09/13/2005  DATE OF DISCHARGE:                                 OPERATIVE REPORT   PROCEDURE:  Colonoscopy.   MEDICATIONS GIVEN:  Fentanyl 20 mcg, Versed 4 mg IV.   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  He has  recently been found to have positive stools and be anemic and repeat  colonoscopy is done a bit sooner than scheduled.   DESCRIPTION OF PROCEDURE:  Following the endoscopy, the patient was turned  around and the Olympus scope was inserted and advanced.  The prep was  excellent.  I was able to advance easily to the anastomosis which was  clearly identified.  There were no abnormalities.  The scope was withdrawn  and the transverse colon, descending and sigmoid colon were seen well.  There were a few scattered diverticula.  No polyps or other lesions seen.  The rectum was free of polyps or other lesions.  The scope was withdrawn.  The patient tolerated the procedure well.   ASSESSMENT:  1.  History of polyp with negative colonoscopy at this time, V12.72.  2.  Anemia with heme positive stool with no obvious cause on colonoscopy,      280.0792.1.   PLAN:  I recommend repeating colonoscopy in five years if clinically  appropriate or see back in the office in two months and recheck the stool.           ______________________________  Llana Aliment. Malon Kindle., M.D.     Waldron Session  D:  09/13/2005  T:  09/13/2005  Job:  [id number]   cc:   Mosetta Putt, M.D.  Fax: [phone]   Thereasa Solo. Little, M.D.  Fax: [phone]

## 2011-08-05 LAB — URINALYSIS, ROUTINE W REFLEX MICROSCOPIC
Bilirubin Urine: NEGATIVE
Glucose, UA: NEGATIVE
Hgb urine dipstick: NEGATIVE
Ketones, ur: NEGATIVE
Ketones, ur: NEGATIVE
Protein, ur: NEGATIVE
Protein, ur: NEGATIVE
Specific Gravity, Urine: 1.01
Urobilinogen, UA: 0.2
Urobilinogen, UA: 0.2

## 2011-08-05 LAB — COMPREHENSIVE METABOLIC PANEL
BUN: 17
CO2: 24
Chloride: 103
Creatinine, Ser: 1.51 — ABNORMAL HIGH
GFR calc non Af Amer: 44 — ABNORMAL LOW
Total Bilirubin: 0.6

## 2011-08-05 LAB — BASIC METABOLIC PANEL
BUN: 16
CO2: 26
CO2: 28
Calcium: 9.7
Chloride: 102
Creatinine, Ser: 1.49
GFR calc Af Amer: 55 — ABNORMAL LOW
GFR calc Af Amer: 55 — ABNORMAL LOW
Glucose, Bld: 126 — ABNORMAL HIGH

## 2011-08-05 LAB — PROTIME-INR
INR: 2.6 — ABNORMAL HIGH
INR: 2.7 — ABNORMAL HIGH
Prothrombin Time: 28.5 — ABNORMAL HIGH
Prothrombin Time: 29.7 — ABNORMAL HIGH

## 2011-08-05 LAB — VITAMIN B12: Vitamin B-12: 369 (ref 211–911)

## 2011-08-05 LAB — TROPONIN I: Troponin I: 0.03

## 2011-08-05 LAB — TSH: TSH: 0.024 — ABNORMAL LOW

## 2011-08-05 LAB — RETICULOCYTES
RBC.: 3.48 — ABNORMAL LOW
Retic Count, Absolute: 13.9 — ABNORMAL LOW
Retic Ct Pct: 0.4

## 2011-08-05 LAB — DIFFERENTIAL
Band Neutrophils: 1
Lymphocytes Relative: 32

## 2011-08-05 LAB — CBC
HCT: 30.2 — ABNORMAL LOW
HCT: 31.8 — ABNORMAL LOW
Hemoglobin: 10.2 — ABNORMAL LOW
MCHC: 33.9
MCHC: 34.6
MCV: 92.5
MCV: 93.9
Platelets: 244
RBC: 3.22 — ABNORMAL LOW
RDW: 13.8
RDW: 13.8
RDW: 14

## 2011-08-05 LAB — IRON AND TIBC: Iron: 64

## 2011-08-05 LAB — LIPID PANEL
Cholesterol: 128
HDL: 46
Total CHOL/HDL Ratio: 2.8
VLDL: 10

## 2011-08-05 LAB — APTT: aPTT: 46 — ABNORMAL HIGH

## 2011-08-05 LAB — CK TOTAL AND CKMB (NOT AT ARMC)
CK, MB: 2.1
Relative Index: INVALID

## 2011-08-05 LAB — FERRITIN: Ferritin: 175 (ref 22–322)

## 2011-08-19 ENCOUNTER — Other Ambulatory Visit: Payer: Self-pay | Admitting: Gastroenterology

## 2011-08-19 ENCOUNTER — Ambulatory Visit
Admission: RE | Admit: 2011-08-19 | Discharge: 2011-08-19 | Disposition: A | Discharge status: Home / Self Care | Payer: Medicare Other | Source: Ambulatory Visit | Attending: Gastroenterology | Admitting: Gastroenterology

## 2011-08-19 DIAGNOSIS — K5641 Fecal impaction: Secondary | ICD-10-CM

## 2011-11-05 ENCOUNTER — Inpatient Hospital Stay (HOSPITAL_COMMUNITY)
Admission: AD | Admit: 2011-11-05 | Discharge: 2011-11-06 | DRG: 310 | Disposition: A | Discharge status: Home / Self Care | Payer: Medicare Other | Source: Ambulatory Visit | Attending: Cardiovascular Disease | Admitting: Cardiovascular Disease

## 2011-11-05 ENCOUNTER — Encounter (HOSPITAL_COMMUNITY): Payer: Self-pay | Admitting: General Practice

## 2011-11-05 ENCOUNTER — Other Ambulatory Visit: Payer: Self-pay

## 2011-11-05 DIAGNOSIS — E119 Type 2 diabetes mellitus without complications: Secondary | ICD-10-CM | POA: Diagnosis present

## 2011-11-05 DIAGNOSIS — I480 Paroxysmal atrial fibrillation: Secondary | ICD-10-CM | POA: Diagnosis present

## 2011-11-05 DIAGNOSIS — I4892 Unspecified atrial flutter: Secondary | ICD-10-CM | POA: Diagnosis not present

## 2011-11-05 DIAGNOSIS — N400 Enlarged prostate without lower urinary tract symptoms: Secondary | ICD-10-CM | POA: Diagnosis present

## 2011-11-05 DIAGNOSIS — E785 Hyperlipidemia, unspecified: Secondary | ICD-10-CM | POA: Diagnosis present

## 2011-11-05 DIAGNOSIS — N183 Chronic kidney disease, stage 3 unspecified: Secondary | ICD-10-CM | POA: Diagnosis present

## 2011-11-05 DIAGNOSIS — Z7901 Long term (current) use of anticoagulants: Secondary | ICD-10-CM

## 2011-11-05 DIAGNOSIS — R339 Retention of urine, unspecified: Secondary | ICD-10-CM | POA: Diagnosis present

## 2011-11-05 DIAGNOSIS — Z87891 Personal history of nicotine dependence: Secondary | ICD-10-CM

## 2011-11-05 DIAGNOSIS — I10 Essential (primary) hypertension: Secondary | ICD-10-CM | POA: Diagnosis present

## 2011-11-05 DIAGNOSIS — I4891 Unspecified atrial fibrillation: Principal | ICD-10-CM

## 2011-11-05 DIAGNOSIS — I129 Hypertensive chronic kidney disease with stage 1 through stage 4 chronic kidney disease, or unspecified chronic kidney disease: Secondary | ICD-10-CM | POA: Diagnosis present

## 2011-11-05 DIAGNOSIS — I428 Other cardiomyopathies: Secondary | ICD-10-CM | POA: Diagnosis present

## 2011-11-05 HISTORY — DX: Shortness of breath: R06.02

## 2011-11-05 HISTORY — DX: Essential (primary) hypertension: I10

## 2011-11-05 HISTORY — DX: Unspecified osteoarthritis, unspecified site: M19.90

## 2011-11-05 HISTORY — DX: Hypothyroidism, unspecified: E03.9

## 2011-11-05 LAB — BASIC METABOLIC PANEL
CO2: 23 mEq/L (ref 19–32)
Calcium: 9.9 mg/dL (ref 8.4–10.5)
Potassium: 4.5 mEq/L (ref 3.5–5.1)
Sodium: 136 mEq/L (ref 135–145)

## 2011-11-05 LAB — CBC
MCV: 91.8 fL (ref 78.0–100.0)
Platelets: 200 10*3/uL (ref 150–400)
RBC: 3.8 MIL/uL — ABNORMAL LOW (ref 4.22–5.81)
WBC: 10.2 10*3/uL (ref 4.0–10.5)

## 2011-11-05 LAB — GLUCOSE, CAPILLARY

## 2011-11-05 LAB — HEMOGLOBIN A1C: Mean Plasma Glucose: 134 mg/dL — ABNORMAL HIGH (ref <117)

## 2011-11-05 LAB — TSH: TSH: 0.706 u[IU]/mL (ref 0.350–4.500)

## 2011-11-05 LAB — PROTIME-INR: Prothrombin Time: 28.9 seconds — ABNORMAL HIGH (ref 11.6–15.2)

## 2011-11-05 MED ORDER — DUTASTERIDE 0.5 MG PO CAPS
0.5000 mg | ORAL_CAPSULE | Freq: Every day | ORAL | Status: DC
  Administered 2011-11-05 – 2011-11-06 (×2): 0.5 mg via ORAL
  Filled 2011-11-05 (×2): qty 1

## 2011-11-05 MED ORDER — ZOLPIDEM TARTRATE 5 MG PO TABS
5.0000 mg | ORAL_TABLET | Freq: Every evening | ORAL | Status: DC | PRN
  Administered 2011-11-06: 5 mg via ORAL
  Filled 2011-11-05: qty 1

## 2011-11-05 MED ORDER — LORATADINE 10 MG PO TABS
10.0000 mg | ORAL_TABLET | Freq: Every day | ORAL | Status: DC
  Filled 2011-11-05 (×2): qty 1

## 2011-11-05 MED ORDER — DRONEDARONE HCL 400 MG PO TABS
400.0000 mg | ORAL_TABLET | Freq: Two times a day (BID) | ORAL | Status: DC
  Administered 2011-11-05 – 2011-11-06 (×2): 400 mg via ORAL
  Filled 2011-11-05 (×3): qty 1

## 2011-11-05 MED ORDER — SILODOSIN 8 MG PO CAPS
8.0000 mg | ORAL_CAPSULE | Freq: Every day | ORAL | Status: DC
  Administered 2011-11-06: 8 mg via ORAL
  Filled 2011-11-05 (×3): qty 1

## 2011-11-05 MED ORDER — DILTIAZEM HCL 100 MG IV SOLR
5.0000 mg/h | INTRAVENOUS | Status: DC
  Administered 2011-11-05 – 2011-11-06 (×2): 5 mg/h via INTRAVENOUS
  Filled 2011-11-05 (×2): qty 100

## 2011-11-05 MED ORDER — POLYETHYLENE GLYCOL 3350 17 G PO PACK
17.0000 g | PACK | Freq: Every day | ORAL | Status: DC
  Administered 2011-11-05 – 2011-11-06 (×2): 17 g via ORAL
  Filled 2011-11-05 (×3): qty 1

## 2011-11-05 MED ORDER — DRONEDARONE HCL 400 MG PO TABS
400.0000 mg | ORAL_TABLET | Freq: Two times a day (BID) | ORAL | Status: DC
  Filled 2011-11-05 (×2): qty 1

## 2011-11-05 MED ORDER — CARVEDILOL 6.25 MG PO TABS
7.2500 mg | ORAL_TABLET | Freq: Two times a day (BID) | ORAL | Status: DC
  Administered 2011-11-06: 7.8125 mg via ORAL
  Filled 2011-11-05 (×4): qty 0.5

## 2011-11-05 MED ORDER — SIMVASTATIN 20 MG PO TABS
20.0000 mg | ORAL_TABLET | Freq: Every day | ORAL | Status: DC
  Administered 2011-11-05: 20 mg via ORAL
  Filled 2011-11-05 (×2): qty 1

## 2011-11-05 MED ORDER — METFORMIN HCL 500 MG PO TABS
500.0000 mg | ORAL_TABLET | Freq: Two times a day (BID) | ORAL | Status: DC
  Administered 2011-11-05: 500 mg via ORAL
  Filled 2011-11-05 (×4): qty 1

## 2011-11-05 MED ORDER — LEVOTHYROXINE SODIUM 50 MCG PO TABS
50.0000 ug | ORAL_TABLET | Freq: Every day | ORAL | Status: DC
  Administered 2011-11-06: 50 ug via ORAL
  Filled 2011-11-05 (×2): qty 1

## 2011-11-05 NOTE — Consult Note (Addendum)
ELECTROPHYSIOLOGY CONSULT NOTE    Patient ID: JENTRY WARNELL MRN: [id number], DOB/AGE: 1923-06-19 78 y.o.  Admit date: 11/05/2011 Date of Consult: 11-05-2011  Primary Physician: Aura Dials, MD, MD Primary Cardiologist: Caprice Kluver, MD  Reason for Consultation: atrial fibrillation  HPI:  Mr. Faiola is a 79 year old male with a history of non-ischemic cardiomyopathy (last EF 35-45% by echo in 2009), atrial flutter (status post ablation in 2008), hypertension, hyperlipidemia, diabetes, and atrial fibrillation.  His arrythmia history dates back to 2001 when he was initially diagnosed with atrial fibrillation.  According to [facility], he underwent DCCV in 2005 and was placed on Amiodarone at that time.  He then had recurrent arrhythmias and was felt to have symptomatic atrial flutter.  He therefore had atrial flutter ablation by Dr Ladona Ridgel in 2008.  He did well until September of 2009 when he presented with fatigue and shortness of breath and was found to have recurrent atrial fibrillation.  He was evaluated by Dr Sherren Kerns at [facility] for possible afib ablation but was felt to be a high risk candidate.  He was subsequently evaluated by me in December of 2009 for a second opinion.  At that time, Amiodarone therapy was recommended with repeat cardioversion.  He was then cardioverted in January of 2010 at which time he was changed from Amiodarone to Dronedarone.  Amiodarone was discontinued because of concerns about thyroid toxicity.  He had done well and maintained sinus rhythm until the last three days when he has noted palpitations, weakness, and fatigue. He was evaluated by Dr Alanda Amass in the office and was found to be in atrial fibrillation with RVR.  It was recommended that he be admitted to [facility] for further observation.  EP has been asked to consult for treatment options of atrial fibrillation.  The patient states that he has not had a recent echocardiogram or stress test.  In reviewing  the chart, it appears that his last echo was in 2009 and demonstrated an EF of 35-45% and a LA of 4.4.   Past Medical History  Diagnosis Date  . Shortness of breath   . Hypothyroidism   . Hypertension   . Dysrhythmia     atrial fibrilation  . Arthritis   . Diabetes mellitus     type 2     Surgical History:  Past Surgical History  Procedure Date  . Back surgery   . Colon surgery     2001     No prescriptions prior to admission    Inpatient Medications:    . carvedilol  7.8125 mg Oral BID WC  . dronedarone  400 mg Oral BID WC  . dutasteride  0.5 mg Oral Daily  . levothyroxine  50 mcg Oral QAC breakfast  . loratadine  10 mg Oral Daily  . metFORMIN  500 mg Oral BID WC  . polyethylene glycol  17 g Oral Daily  . silodosin  8 mg Oral Q breakfast  . simvastatin  20 mg Oral q1800    Allergies: No Known Allergies  History   Social History  . Marital Status: Married    Spouse Name: N/A    Number of Children: N/A  . Years of Education: N/A   Occupational History  . Not on file.   Social History Main Topics  . Smoking status: Former Games developer  . Smokeless tobacco: Not on file   Comment: quit years ago "  . Alcohol Use: No  . Drug Use: No  .  Sexually Active: Not Currently   Other Topics Concern  . Not on file   Social History Narrative  . No narrative on file     History reviewed. No pertinent family history.  ROS- all systems are reviewed and negative except as per HPI  Physical Exam: Filed Vitals:   11/05/11 1300 11/05/11 1400 11/05/11 1452  BP: 95/60 118/84 98/62  Pulse: 106 87 110  Temp: 97.7 F (36.5 C) 97.2 F (36.2 C)   Resp: 20 18   Height: 5\' 10"  (1.778 m)    Weight: 206 lb (93.441 kg)    SpO2: 92% 95%     GEN- The patient is elderly appearing, alert and oriented x 3 today.   Head- normocephalic, atraumatic Eyes-  Sclera clear, conjunctiva pink Ears- hearing intact Oropharynx- clear Neck- supple, no JVP Lymph- no cervical  lymphadenopathy Lungs- Clear to ausculation bilaterally, normal work of breathing Heart- irregular rate and rhythm,   GI- soft, NT, ND, + BS Extremities- no clubbing, cyanosis, or edema MS- age appropriate muscle atrophy Skin- no rash or lesion Psych- euthymic mood, full affect Neuro- strength and sensation are intact  Assessment and Plan:   Labs:   Lab Results  Component Value Date   WBC 10.2 11/05/2011   HGB 11.7* 11/05/2011   HCT 34.9* 11/05/2011   MCV 91.8 11/05/2011   PLT 200 11/05/2011    Lab 11/05/11 1337  NA 136  K 4.5  CL 103  CO2 23  BUN 20  CREATININE 1.58*  CALCIUM 9.9  PROT --  BILITOT --  ALKPHOS --  ALT --  AST --  GLUCOSE 111*     Radiology/Studies: No results found.  EKG: atrial fibrillation with RVR, rate 127, QRS 84ms, QT340ms  TELEMETRY: atrial fibrillation with RVR, rate 120's  Assessment/Impression/Plan:  The patient has a h/o symptomatic persistent atrial fibrillation.  He has done well over the past 2-3 years with multaq.  He has returned to afib over the past 2-3 days.  He is mildly symptomatic with elevated ventricular rates.  Therapeutic strategies for afib including medicine and ablation were discussed in detail with the patient today.  Given the patient's advanced age, he is not a candidate for afib ablation.  In addition, I would recommend that we avoid BiV pacemaker and AV nodal ablations until our medicine options are exhausted. I have informed the patient that his options are 1) cardioversion with ongoing multaq (he did well for 2-3 years after his prior cardioversion), 2) switching to tikosyn, 3) retrying amiodarone. At this time, the patient is clear that he would like to continue multaq and proceed wth cardioversion. Per Dr Alanda Amass, the patient's INRs have been therapeutic in his office.  I therefore think proceeding with cardioversion at this time is reasonable. I would recommend 2D echo to evaluate EF and LA size.  If his EF is >40%,  then he could probably continue multaq, however if his EF is <40%, then I would recommend that we retry amiodarone or consider tikosyn. I have discussed this plan with Dr Alanda Amass who is in agreement.  Will follow with you as needed while here. Call with questions.  Vannie Hilgert,MD 5:14 PM 11/05/2011

## 2011-11-05 NOTE — Progress Notes (Signed)
ANTICOAGULATION CONSULT NOTE - Initial Consult  Pharmacy Consult for Coumadin Indication: atrial fibrillation  No Known Allergies  Patient Measurements: Height: 5\' 10"  (177.8 cm) Weight: 206 lb (93.441 kg) IBW/kg (Calculated) : 73   Vital Signs: Temp: 97.2 F (36.2 C) (01/08 1400) BP: 98/62 mmHg (01/08 1452) Pulse Rate: 110  (01/08 1452)  Labs:  Basename 11/05/11 1542 11/05/11 1337  HGB -- 11.7*  HCT -- 34.9*  PLT -- 200  APTT -- --  LABPROT 28.9* --  INR 2.67* --  HEPARINUNFRC -- --  CREATININE -- 1.58*  CKTOTAL -- --  CKMB -- --  TROPONINI -- --   Estimated Creatinine Clearance: 38.5 ml/min (by C-G formula based on Cr of 1.58).  Medical History: Past Medical History  Diagnosis Date  . Shortness of breath   . Hypothyroidism   . Hypertension   . Dysrhythmia     atrial fibrilation  . Arthritis   . Diabetes mellitus     type 2    Medications:  Scheduled:    . carvedilol  7.8125 mg Oral BID WC  . dronedarone  400 mg Oral BID WC  . dutasteride  0.5 mg Oral Daily  . levothyroxine  50 mcg Oral QAC breakfast  . loratadine  10 mg Oral Daily  . metFORMIN  500 mg Oral BID WC  . polyethylene glycol  17 g Oral Daily  . silodosin  8 mg Oral Q breakfast  . simvastatin  20 mg Oral q1800    Assessment: 88 YOM on coumadin prior to admission for A-fib. Pharmacy is consulted to dose coumadin. INR therapeutic on admission. I spoke with patient, he stated that he has already taken a dose this morning.  Goal of Therapy:  INR 2-3   Plan:  1. No coumadin today 2. F/u INR tomorrow morning, if stable, will resume home dose.  Ronald Payne 11/05/2011,4:25 PM

## 2011-11-06 ENCOUNTER — Encounter (HOSPITAL_COMMUNITY): Payer: Self-pay | Admitting: Certified Registered"

## 2011-11-06 ENCOUNTER — Encounter (HOSPITAL_COMMUNITY)
Admission: AD | Disposition: A | Discharge status: Home / Self Care | Payer: Self-pay | Source: Ambulatory Visit | Attending: Cardiovascular Disease

## 2011-11-06 ENCOUNTER — Other Ambulatory Visit: Payer: Self-pay

## 2011-11-06 ENCOUNTER — Inpatient Hospital Stay (HOSPITAL_COMMUNITY): Payer: Medicare Other | Admitting: Certified Registered"

## 2011-11-06 DIAGNOSIS — Z7901 Long term (current) use of anticoagulants: Secondary | ICD-10-CM

## 2011-11-06 DIAGNOSIS — N400 Enlarged prostate without lower urinary tract symptoms: Secondary | ICD-10-CM | POA: Diagnosis present

## 2011-11-06 DIAGNOSIS — I428 Other cardiomyopathies: Secondary | ICD-10-CM | POA: Diagnosis present

## 2011-11-06 DIAGNOSIS — E785 Hyperlipidemia, unspecified: Secondary | ICD-10-CM | POA: Diagnosis present

## 2011-11-06 DIAGNOSIS — E119 Type 2 diabetes mellitus without complications: Secondary | ICD-10-CM | POA: Diagnosis present

## 2011-11-06 DIAGNOSIS — I480 Paroxysmal atrial fibrillation: Secondary | ICD-10-CM | POA: Diagnosis present

## 2011-11-06 DIAGNOSIS — N183 Chronic kidney disease, stage 3 unspecified: Secondary | ICD-10-CM | POA: Diagnosis present

## 2011-11-06 DIAGNOSIS — I10 Essential (primary) hypertension: Secondary | ICD-10-CM | POA: Diagnosis present

## 2011-11-06 DIAGNOSIS — I4892 Unspecified atrial flutter: Secondary | ICD-10-CM | POA: Diagnosis not present

## 2011-11-06 SURGERY — CARDIOVERSION
Anesthesia: Monitor Anesthesia Care | Wound class: Clean

## 2011-11-06 MED ORDER — SIMVASTATIN 5 MG PO TABS: 5.0000 mg | ORAL_TABLET | Freq: Every day | ORAL | Status: DC

## 2011-11-06 MED ORDER — SODIUM CHLORIDE 0.9 % IJ SOLN: 3.0000 mL | Freq: Two times a day (BID) | INTRAMUSCULAR | Status: DC

## 2011-11-06 MED ORDER — SODIUM CHLORIDE 0.9 % IV SOLN: 250.0000 mL | INTRAVENOUS | Status: DC

## 2011-11-06 MED ORDER — SODIUM CHLORIDE 0.9 % IJ SOLN: 3.0000 mL | INTRAMUSCULAR | Status: DC | PRN

## 2011-11-06 MED ORDER — DUTASTERIDE 0.5 MG PO CAPS: 0.5000 mg | ORAL_CAPSULE | Freq: Every day | ORAL | Status: DC

## 2011-11-06 MED ORDER — GLUCOSAMINE-CHONDROITIN 500-400 MG PO CAPS: ORAL_CAPSULE | Freq: Every day | ORAL | Status: DC

## 2011-11-06 MED ORDER — SILODOSIN 8 MG PO CAPS: 8.0000 mg | ORAL_CAPSULE | Freq: Every day | ORAL | Status: DC

## 2011-11-06 MED ORDER — ZOLPIDEM TARTRATE 5 MG PO TABS: 5.0000 mg | ORAL_TABLET | Freq: Every evening | ORAL | Status: DC | PRN

## 2011-11-06 MED ORDER — SODIUM CHLORIDE 0.9 % IV SOLN: INTRAVENOUS | Status: DC

## 2011-11-06 MED ORDER — CARVEDILOL 6.25 MG PO TABS
9.7500 mg | ORAL_TABLET | Freq: Two times a day (BID) | ORAL | Status: DC
  Filled 2011-11-06 (×2): qty 1

## 2011-11-06 MED ORDER — PROPOFOL 10 MG/ML IV BOLUS
INTRAVENOUS | Status: DC | PRN
  Administered 2011-11-06: 50 mg via INTRAVENOUS

## 2011-11-06 MED ORDER — VITAMIN B-12 1000 MCG PO TABS
1000.0000 ug | ORAL_TABLET | Freq: Every day | ORAL | Status: DC
  Filled 2011-11-06: qty 1

## 2011-11-06 MED ORDER — HYDROCORTISONE 1 % EX CREA: 1.0000 "application " | TOPICAL_CREAM | Freq: Three times a day (TID) | CUTANEOUS | Status: DC | PRN

## 2011-11-06 MED ORDER — LORATADINE 10 MG PO TABS: 10.0000 mg | ORAL_TABLET | Freq: Every day | ORAL | Status: DC

## 2011-11-06 MED ORDER — DOCUSATE SODIUM 100 MG PO CAPS
100.0000 mg | ORAL_CAPSULE | Freq: Three times a day (TID) | ORAL | Status: DC | PRN
  Filled 2011-11-06: qty 1

## 2011-11-06 MED ORDER — DRONEDARONE HCL 400 MG PO TABS: 400.0000 mg | ORAL_TABLET | Freq: Two times a day (BID) | ORAL | Status: DC

## 2011-11-06 MED ORDER — LEVOTHYROXINE SODIUM 50 MCG PO TABS: 50.0000 ug | ORAL_TABLET | Freq: Every day | ORAL | Status: DC

## 2011-11-06 MED ORDER — ROSUVASTATIN CALCIUM 5 MG PO TABS
5.0000 mg | ORAL_TABLET | Freq: Every day | ORAL | Status: DC
  Filled 2011-11-06: qty 1

## 2011-11-06 MED ORDER — METFORMIN HCL 500 MG PO TABS: 500.0000 mg | ORAL_TABLET | Freq: Two times a day (BID) | ORAL | Status: DC

## 2011-11-06 MED ORDER — WARFARIN SODIUM 5 MG PO TABS: 5.0000 mg | ORAL_TABLET | Freq: Every day | ORAL | Status: DC

## 2011-11-06 NOTE — Anesthesia Preprocedure Evaluation (Addendum)
Anesthesia Evaluation    Airway       Dental   Pulmonary          Cardiovascular hypertension, + dysrhythmias     Neuro/Psych    GI/Hepatic   Endo/Other  Diabetes mellitus-, Type 2Hypothyroidism   Renal/GU      Musculoskeletal   Abdominal   Peds  Hematology   Anesthesia Other Findings   Reproductive/Obstetrics                           Anesthesia Physical Anesthesia Plan  ASA: III  Anesthesia Plan: General   Post-op Pain Management:    Induction: Intravenous  Airway Management Planned: Mask  Additional Equipment:   Intra-op Plan:   Post-operative Plan:   Informed Consent: I have reviewed the patients History and Physical, chart, labs and discussed the procedure including the risks, benefits and alternatives for the proposed anesthesia with the patient or authorized representative who has indicated his/her understanding and acceptance.     Plan Discussed with: CRNA  Anesthesia Plan Comments:         Anesthesia Quick Evaluation

## 2011-11-06 NOTE — Progress Notes (Signed)
Patient is on Lovastatin 40mg  daily at home. Equivalent dose of simvastatin 20mg  daily was ordered for this patient on admission. Patient is also on diltiazem and multaq which may increase the risk for rhabdomyolysis.   Plan: I switched simvastatin to rosuvastatin 5mg  daily to avoid drug-drug interaction.

## 2011-11-06 NOTE — Discharge Summary (Signed)
Patient ID: Ronald Payne,  MRN: [id number], DOB/AGE: 1923/07/19 78 y.o.  Admit date: 11/05/2011 Discharge date: 11/06/2011  Primary Care Provider:  Primary Cardiologist: Dr Clarene Duke  Discharge Diagnoses  Active Problems:  PAF (paroxysmal atrial fibrillation), recurrent symptomatic AF this admission, s/p DCCV   NICM (nonischemic cardiomyopathy), 35-40% 2D 9/09  Chronic anticoagulation  Diabetes mellitus, Type 2 NIDDM  HTN (hypertension), hypotensive in AF  Dyslipidemia  BPH (benign prostatic hyperplasia), Dr Vernie Ammons follows  Atrial flutter, s/p RFA 12/08  Chronic renal insufficiency, stage III (moderate)    Procedures: DCCV 11/06/11   Hospital Course: Ronald Payne is an 78 year old male followed by Dr. Caprice Kluver. He has a history of paroxysmal atrial fibrillation. He's had previous cardioversions in the past and in December of 2008 he had an atrial flutter ablation. He was referred to [facility] for possible atrial fibrillation ablation in 2009 but was felt to be too old for this procedure. He's been doing well, in sinus rhythm on Multaq therapy since January 2010. He previously had been on amiodarone but had some problems with hyperthyroidism and this was discontinued. Dr. Alanda Amass saw him in the office 11/05/11. The patient had been in atrial fibrillation with complaints of shortness of breath and fatigue. He was admitted to telemetry. He was seen in consult by Dr. Johney Frame. Please see his consult note for complete details. Dr. Johney Frame does not feel he is a candidate for atrial fibrillation ablation. Plan is to continue Multaq and go ahead and perform a DC cardioversion this admission. This was done by Dr. Rennis Golden 11/06/2011. He'll be discharged later today as long as he remains in  sinus rhythm. He'll continue his Coumadin and other medications as previously prescribed. He'll followup with Dr. Clarene Duke as an outpatient. An echocardiogram will need to be done as an outpatient. If the patient's ejection  fraction is less than 35-40% we may need to consider stopping Multaq and putting him on Tikosyn.  Discharge Vitals:  Blood pressure 108/46, pulse 89, temperature 97.2 F (36.2 C), temperature source Oral, resp. rate 18, height 5\' 10"  (1.778 m), weight 93.441 kg (206 lb), SpO2 99.00%.    Labs: Results for orders placed during the hospital encounter of 11/05/11 (from the past 48 hour(s))  BASIC METABOLIC PANEL     Status: Abnormal   Collection Time   11/05/11  1:37 PM      Component Value Range Comment   Sodium 136  135 - 145 (mEq/L)    Potassium 4.5  3.5 - 5.1 (mEq/L)    Chloride 103  96 - 112 (mEq/L)    CO2 23  19 - 32 (mEq/L)    Glucose, Bld 111 (*) 70 - 99 (mg/dL)    BUN 20  6 - 23 (mg/dL)    Creatinine, Ser 4.09 (*) 0.50 - 1.35 (mg/dL)    Calcium 9.9  8.4 - 10.5 (mg/dL)    GFR calc non Af Amer 38 (*) >90 (mL/min)    GFR calc Af Amer 44 (*) >90 (mL/min)   CBC     Status: Abnormal   Collection Time   11/05/11  1:37 PM      Component Value Range Comment   WBC 10.2  4.0 - 10.5 (K/uL)    RBC 3.80 (*) 4.22 - 5.81 (MIL/uL)    Hemoglobin 11.7 (*) 13.0 - 17.0 (g/dL)    HCT 81.1 (*) 91.4 - 52.0 (%)    MCV 91.8  78.0 - 100.0 (fL)    MCH 30.8  26.0 - 34.0 (pg)    MCHC 33.5  30.0 - 36.0 (g/dL)    RDW 16.1  09.6 - 04.5 (%)    Platelets 200  150 - 400 (K/uL)   TSH     Status: Normal   Collection Time   11/05/11  1:37 PM      Component Value Range Comment   TSH 0.706  0.350 - 4.500 (uIU/mL)   HEMOGLOBIN A1C     Status: Abnormal   Collection Time   11/05/11  1:37 PM      Component Value Range Comment   Hemoglobin A1C 6.3 (*) <5.7 (%)    Mean Plasma Glucose 134 (*) <117 (mg/dL)   PROTIME-INR     Status: Abnormal   Collection Time   11/05/11  3:42 PM      Component Value Range Comment   Prothrombin Time 28.9 (*) 11.6 - 15.2 (seconds)    INR 2.67 (*) 0.00 - 1.49    GLUCOSE, CAPILLARY     Status: Abnormal   Collection Time   11/05/11  4:39 PM      Component Value Range Comment    Glucose-Capillary 109 (*) 70 - 99 (mg/dL)    Comment 1 Notify RN     GLUCOSE, CAPILLARY     Status: Abnormal   Collection Time   11/05/11  9:06 PM      Component Value Range Comment   Glucose-Capillary 112 (*) 70 - 99 (mg/dL)   PROTIME-INR     Status: Abnormal   Collection Time   11/06/11  6:15 AM      Component Value Range Comment   Prothrombin Time 36.5 (*) 11.6 - 15.2 (seconds)    INR 3.61 (*) 0.00 - 1.49    GLUCOSE, CAPILLARY     Status: Abnormal   Collection Time   11/06/11  7:51 AM      Component Value Range Comment   Glucose-Capillary 107 (*) 70 - 99 (mg/dL)   GLUCOSE, CAPILLARY     Status: Abnormal   Collection Time   11/06/11 11:30 AM      Component Value Range Comment   Glucose-Capillary 114 (*) 70 - 99 (mg/dL)     Disposition:  Follow-up Information    Follow up with LITTLE, ALFRED B, MD. (office will call)    Contact information:   [address] [place] [address] [phone]          Discharge Medications:  Current Discharge Medication List    CONTINUE these medications which have NOT CHANGED   Details  carvedilol (COREG) 6.25 MG tablet Take 9.75 mg by mouth 2 (two) times daily with a meal.      cetirizine (ZYRTEC) 10 MG tablet Take 10 mg by mouth daily.      docusate sodium (COLACE) 100 MG capsule Take 100 mg by mouth 3 (three) times daily as needed. For constipation     dronedarone (MULTAQ) 400 MG tablet Take 400 mg by mouth 2 (two) times daily with a meal.      dutasteride (AVODART) 0.5 MG capsule Take 0.5 mg by mouth daily.      eszopiclone (LUNESTA) 2 MG TABS Take 2 mg by mouth at bedtime. Take immediately before bedtime     Glucosamine-Chondroitin (COSAMIN DS PO) Take 1 tablet by mouth 2 (two) times daily.      levothyroxine (SYNTHROID, LEVOTHROID) 50 MCG tablet Take 50 mcg by mouth daily.      lovastatin (MEVACOR) 40  MG tablet Take 40 mg by mouth at bedtime.      metFORMIN (GLUCOPHAGE) 500 MG tablet Take 500 mg by  mouth 2 (two) times daily with a meal.      silodosin (RAPAFLO) 8 MG CAPS capsule Take 8 mg by mouth daily with breakfast.      vitamin B-12 (CYANOCOBALAMIN) 1000 MCG tablet Take 1,000 mcg by mouth daily.      warfarin (COUMADIN) 5 MG tablet Take 5 mg by mouth daily.        STOP taking these medications     OVER THE COUNTER MEDICATION         Outstanding Labs/Studies  Duration of Discharge Encounter: Greater than 30 minutes including physician time.  Jolene Provost PA-C 11/06/2011 2:44 PM

## 2011-11-06 NOTE — Discharge Summary (Signed)
Kenneth C. Hilty, MD Attending Cardiologist [facility]  

## 2011-11-06 NOTE — Progress Notes (Signed)
ANTICOAGULATION CONSULT NOTE - Follow Up Consult  Pharmacy Consult for Coumadin  Indication: atrial fibrillation  Allergies  Allergen Reactions  . Amiodarone Other (See Comments)    Hyperthyroid on Amio in the past, changed to Multaq    Patient Measurements: Height: 5\' 10"  (177.8 cm) Weight: 206 lb (93.441 kg) IBW/kg (Calculated) : 73   Vital Signs: Temp: 97.3 F (36.3 C) (01/09 0500) BP: 92/57 mmHg (01/09 0900) Pulse Rate: 74  (01/09 0900)  Labs:  Basename 11/06/11 0615 11/05/11 1542 11/05/11 1337  HGB -- -- 11.7*  HCT -- -- 34.9*  PLT -- -- 200  APTT -- -- --  LABPROT 36.5* 28.9* --  INR 3.61* 2.67* --  HEPARINUNFRC -- -- --  CREATININE -- -- 1.58*  CKTOTAL -- -- --  CKMB -- -- --  TROPONINI -- -- --   Estimated Creatinine Clearance: 38.5 ml/min (by C-G formula based on Cr of 1.58).   Medications:  Scheduled:    . carvedilol  7.8125 mg Oral BID WC  . dronedarone  400 mg Oral BID WC  . dutasteride  0.5 mg Oral Daily  . levothyroxine  50 mcg Oral QAC breakfast  . loratadine  10 mg Oral Daily  . metFORMIN  500 mg Oral BID WC  . polyethylene glycol  17 g Oral Daily  . silodosin  8 mg Oral Q breakfast  . simvastatin  20 mg Oral q1800  . sodium chloride  3 mL Intravenous Q12H  . DISCONTD: dronedarone  400 mg Oral BID WC    Assessment: 88 YOM on coumadin prior to admission for A-fib. INR supratherapeutic today, increased from 2.67 to 3.61, patient received yesterday's coumadin dose at home prior to admission. Plan for DCCV today. No new cbc today, no bleeding noted per chart  Goal of Therapy:  INR 2-3   Plan:  Hold coumadin today. F/u INR tomorrow  Riki Rusk 11/06/2011,11:25 AM

## 2011-11-06 NOTE — Op Note (Signed)
[facility]  CARDIOVERSION NOTE   Procedure: Electrical Cardioversion Indications:  Atrial Fibrillation  Procedure Details:  Consent: Risks of procedure as well as the alternatives and risks of each were explained to the (patient/caregiver).  Consent for procedure obtained.  Time Out: Verified patient identification, verified procedure, site/side was marked, verified correct patient position, special equipment/implants available, medications/allergies/relevent history reviewed, required imaging and test results available.  Performed  Patient placed on cardiac monitor, pulse oximetry, supplemental oxygen as necessary.  Sedation given: per anesthesia Pacer pads placed anterior and posterior chest.  Cardioverted 1 time(s).  Cardioverted at 150J biphasic     Evaluation: Findings: Post procedure EKG shows: NSR Complications: None Patient did tolerate procedure well.  Time Spent Directly with the Patient:  15 minutes   Ronald Nose, MD Attending Cardiologist [facility]  Ronald Payne,Ronald Payne 11/06/2011, 2:14 PM

## 2011-11-06 NOTE — Anesthesia Postprocedure Evaluation (Signed)
  Anesthesia Post-op Note  Patient: Ronald Payne  Procedure(s) Performed:  CARDIOVERSION  Patient Location: Nursing Unit  Anesthesia Type: General  Level of Consciousness: sedated  Airway and Oxygen Therapy: Patient Spontanous Breathing and Patient connected to nasal cannula oxygen  Post-op Pain: none  Post-op Assessment: Post-op Vital signs reviewed, Patient's Cardiovascular Status Stable, Respiratory Function Stable and Patent Airway  Post-op Vital Signs: Reviewed and stable  Complications: No apparent anesthesia complications

## 2011-11-06 NOTE — Progress Notes (Signed)
Pt. Seen and examined. Agree with the NP/PA-C note as written.  Plan DCCV today. Will check outpatient 2D echo in the next few days if converted. Continue Multaq.  Chrystie Nose, MD Attending Cardiologist [facility]

## 2011-11-06 NOTE — Progress Notes (Signed)
DC orders received. Instructions and medications reviewed with the patient.  Patient stable with no S/S distress. Pt DC home with wife. Nolon Nations

## 2011-11-06 NOTE — Preoperative (Signed)
Beta Blockers   Reason not to administer Beta Blockers:Not Applicable, Took beta blocker this am 

## 2011-11-06 NOTE — Transfer of Care (Signed)
Immediate Anesthesia Transfer of Care Note  Patient: Ronald Payne  Procedure(s) Performed:  CARDIOVERSION  Patient Location: PACU   Room 3731  Anesthesia Type: General  Level of Consciousness: awake, alert , oriented and patient cooperative  Airway & Oxygen Therapy: Patient Spontanous Breathing and Patient connected to nasal cannula oxygen  Post-op Assessment: Report given to PACU RN, Post -op Vital signs reviewed and stable and Patient moving all extremities  Post vital signs: Reviewed and stable  Complications: No apparent anesthesia complications

## 2011-11-06 NOTE — Progress Notes (Signed)
Subjective:  See Dr Kandis Cocking dictated office H&P for complete details. 78y/o with a history of PAF. He has been in NSR on Multaq and Coumadin since Jan 2010. He presented to the office with recurrent AF which is symptomatic. See consult by Dr Tillie Rung, plan is for DCCV today on Multaq.  Objective:  Vital Signs in the last 24 hours: Temp:  [97.2 F (36.2 C)-97.7 F (36.5 C)] 97.3 F (36.3 C) (01/09 0500) Pulse Rate:  [85-110] 102  (01/09 0749) Resp:  [18-20] 18  (01/09 0500) BP: (95-120)/(58-84) 120/77 mmHg (01/09 0749) SpO2:  [90 %-99 %] 90 % (01/09 0500) Weight:  [93.441 kg (206 lb)] 93.441 kg (206 lb) (01/08 1300)  Intake/Output from previous day:  Intake/Output Summary (Last 24 hours) at 11/06/11 0831 Last data filed at 11/06/11 0420  Gross per 24 hour  Intake      0 ml  Output    925 ml  Net   -925 ml    Physical Exam: General appearance: alert, cooperative and no distress Lungs: decreased breath sounds Heart: irregularly irregular rhythm   Rate: 110-125  Rhythm: atrial fibrillation  Lab Results:  Basename 11/05/11 1337  WBC 10.2  HGB 11.7*  PLT 200    Basename 11/05/11 1337  NA 136  K 4.5  CL 103  CO2 23  GLUCOSE 111*  BUN 20  CREATININE 1.58*   No results found for this basename: TROPONINI:2,CK,MB:2 in the last 72 hours Hepatic Function Panel No results found for this basename: PROT,ALBUMIN,AST,ALT,ALKPHOS,BILITOT,BILIDIR,IBILI in the last 72 hours No results found for this basename: CHOL in the last 72 hours  Basename 11/06/11 0615  INR 3.61*    Imaging: No results found.  Cardiac Studies:  Assessment/Plan:   Active Problems:  PAF (paroxysmal atrial fibrillation), recurrent symptomatic AF this admission   NICM (nonischemic cardiomyopathy), 35-40% 2D 9/09  Chronic anticoagulation  Diabetes mellitus, Type 2 NIDDM  HTN (hypertension), hypotensive in AF  Dyslipidemia  BPH (benign prostatic hyperplasia), Dr Vernie Ammons follows  Atrial flutter,  s/p RFA 12/08  Chronic renal insufficiency, stage III (moderate)  Plan- DCCV 2pm today, check 2D once in NSR, if EF < 40% consider changing Multaq to Tykosin.    Corine Shelter PA-C 11/06/2011, 8:31 AM

## 2011-11-06 NOTE — Progress Notes (Signed)
PHARMACIST - PHYSICIAN ORDER COMMUNICATION  CONCERNING: P&T Medication Policy on Herbal Medications  DESCRIPTION:  This patient's order for: glucosamine-chondroitin   has been noted.  This product(s) is classified as an "herbal" or natural product. Due to a lack of definitive safety studies or FDA approval, nonstandard manufacturing practices, plus the potential risk of unknown drug-drug interactions while on inpatient medications, the Pharmacy and Therapeutics Committee does not permit the use of "herbal" or natural products of this type within [facility].   ACTION TAKEN: The pharmacy department is unable to verify this order at this time and your patient has been informed of this safety policy. Please reevaluate patient's clinical condition at discharge and address if the herbal or natural product(s) should be resumed at that time.  Len Childs T 11/06/2011 3:58 PM

## 2011-11-07 ENCOUNTER — Encounter (HOSPITAL_COMMUNITY): Payer: Self-pay | Admitting: Internal Medicine

## 2013-01-13 ENCOUNTER — Ambulatory Visit: Payer: Self-pay | Admitting: Cardiovascular Disease

## 2013-01-13 DIAGNOSIS — Z7901 Long term (current) use of anticoagulants: Secondary | ICD-10-CM

## 2013-01-13 DIAGNOSIS — I480 Paroxysmal atrial fibrillation: Secondary | ICD-10-CM

## 2013-03-24 ENCOUNTER — Ambulatory Visit: Payer: Medicare Other | Admitting: Pharmacist Clinician (PhC)/ Clinical Pharmacy Specialist

## 2013-03-26 ENCOUNTER — Ambulatory Visit (INDEPENDENT_AMBULATORY_CARE_PROVIDER_SITE_OTHER): Payer: Medicare Other | Admitting: Pharmacist Clinician (PhC)/ Clinical Pharmacy Specialist

## 2013-03-26 VITALS — BP 128/70 | HR 64

## 2013-03-26 DIAGNOSIS — I480 Paroxysmal atrial fibrillation: Secondary | ICD-10-CM

## 2013-03-26 DIAGNOSIS — Z7901 Long term (current) use of anticoagulants: Secondary | ICD-10-CM

## 2013-03-26 DIAGNOSIS — I4891 Unspecified atrial fibrillation: Secondary | ICD-10-CM

## 2013-04-23 ENCOUNTER — Ambulatory Visit (INDEPENDENT_AMBULATORY_CARE_PROVIDER_SITE_OTHER): Payer: Medicare Other | Admitting: Pharmacist Clinician (PhC)/ Clinical Pharmacy Specialist

## 2013-04-23 VITALS — BP 140/82 | HR 72

## 2013-04-23 DIAGNOSIS — I480 Paroxysmal atrial fibrillation: Secondary | ICD-10-CM

## 2013-04-23 DIAGNOSIS — I4891 Unspecified atrial fibrillation: Secondary | ICD-10-CM

## 2013-04-23 DIAGNOSIS — Z7901 Long term (current) use of anticoagulants: Secondary | ICD-10-CM

## 2013-05-12 ENCOUNTER — Encounter: Payer: Self-pay | Admitting: *Deleted

## 2013-05-14 ENCOUNTER — Ambulatory Visit (INDEPENDENT_AMBULATORY_CARE_PROVIDER_SITE_OTHER): Payer: Medicare Other | Admitting: Pharmacist Clinician (PhC)/ Clinical Pharmacy Specialist

## 2013-05-14 VITALS — BP 142/78 | HR 72

## 2013-05-14 DIAGNOSIS — I480 Paroxysmal atrial fibrillation: Secondary | ICD-10-CM

## 2013-05-14 DIAGNOSIS — I4891 Unspecified atrial fibrillation: Secondary | ICD-10-CM

## 2013-05-14 DIAGNOSIS — Z7901 Long term (current) use of anticoagulants: Secondary | ICD-10-CM

## 2013-06-03 ENCOUNTER — Ambulatory Visit (INDEPENDENT_AMBULATORY_CARE_PROVIDER_SITE_OTHER): Payer: Medicare Other | Admitting: Cardiovascular Disease

## 2013-06-03 ENCOUNTER — Encounter: Payer: Self-pay | Admitting: Cardiovascular Disease

## 2013-06-03 VITALS — BP 142/88 | HR 61 | Resp 16 | Ht 72.0 in | Wt 199.0 lb

## 2013-06-03 DIAGNOSIS — I480 Paroxysmal atrial fibrillation: Secondary | ICD-10-CM

## 2013-06-03 DIAGNOSIS — I4891 Unspecified atrial fibrillation: Secondary | ICD-10-CM

## 2013-06-03 DIAGNOSIS — I428 Other cardiomyopathies: Secondary | ICD-10-CM

## 2013-06-03 DIAGNOSIS — I10 Essential (primary) hypertension: Secondary | ICD-10-CM

## 2013-06-03 DIAGNOSIS — I4892 Unspecified atrial flutter: Secondary | ICD-10-CM

## 2013-06-03 NOTE — Patient Instructions (Addendum)
Your physician recommends that you schedule a follow-up appointment in: 1 year  

## 2013-06-04 NOTE — Assessment & Plan Note (Addendum)
A1c 5.5-5.8%

## 2013-06-04 NOTE — Assessment & Plan Note (Signed)
No clinical evidence of congestive heart failure. On carvedilol.

## 2013-06-04 NOTE — Progress Notes (Signed)
Patient ID: Ronald Payne, male   DOB: September 23, 1923, 79 y.o.   MRN: [id number]     Reason for office visit Atrial fibrillation, nonischemic cardiomyopathy  Ronald Payne is an 79 year old veteran of [employer] War [employer] returns for followup. He has had recurrent symptomatic atrial fibrillation but has not been arrhythmia free for 18 months. He has a history of previous radiofrequency ablation for atrial flutter in 2008 and ablation for atrial fibrillation at [facility] in 2011. He has mild ischemic cardiomyopathy. His most recent echocardiogram showed left ventricular ejection fraction of 45-50%. He is maintaining sinus rhythm on treatment with Multaq and takes brand name Coumadin for anticoagulation. He has not had any serious bleeding problems and has not had stroke or TIA.  He has no cardiovascular complaints today. The thing that bothers him the most is persistent constipation which she traces back to a partial colectomy that he had many years ago. He has discussed this with his gastroenterologist and wants to go back to see a surgeon to see if he can help. He takes MiraLax and stool softeners.  He also complains of significant pain in his right knee and is currently undergoing a series of Synvisc injections  Allergies  Allergen Reactions  . Amiodarone Other (See Comments)    Hyperthyroid on Amio in the past, changed to Multaq    Current Outpatient Prescriptions  Medication Sig Dispense Refill  . carvedilol (COREG) 6.25 MG tablet Take 12.5 mg by mouth 2 (two) times daily with a meal.       . cetirizine (ZYRTEC) 10 MG tablet Take 10 mg by mouth daily. Take 1 tablet daily for allergies.      . chlorthalidone (HYGROTON) 25 MG tablet Take 25 mg by mouth daily. Take 1 tablet by mouth daily.      Marland Kitchen docusate sodium (COLACE) 100 MG capsule Take 100 mg by mouth 3 (three) times daily as needed. For constipation       . dronedarone (MULTAQ) 400 MG tablet Take 400 mg by mouth 2 (two) times daily with a  meal.        . Glucosamine-Chondroitin (COSAMIN DS PO) Take 1 tablet by mouth 2 (two) times daily.        Marland Kitchen levothyroxine (SYNTHROID, LEVOTHROID) 50 MCG tablet Take 50 mcg by mouth daily.        Marland Kitchen lovastatin (MEVACOR) 40 MG tablet Take 40 mg by mouth at bedtime. Take 40 mg daily to control cholesterol.      . metFORMIN (GLUCOPHAGE) 500 MG tablet Take 500 mg by mouth 2 (two) times daily with a meal. Take 1 tablet twice daily for blood sugar.      . mometasone (NASONEX) 50 MCG/ACT nasal spray Place 2 sprays into the Payne daily. One spray everyday.      . warfarin (COUMADIN) 2.5 MG tablet Take 2.5 mg by mouth daily. Take 1 tablet Sunday and Wednesday.      . warfarin (COUMADIN) 5 MG tablet Take 5 mg by mouth daily. Take 1 tablet on Monday, Tuesday, Thursday, Friday and Saturday.       No current facility-administered medications for this visit.    Past Medical History  Diagnosis Date  . Shortness of breath   . Hypothyroidism   . Hypertension   . Dysrhythmia     atrial fibrilation  . Arthritis   . Diabetes mellitus     type 2  . Insomnia, unspecified   . Acute bronchitis   . Other  and unspecified hyperlipidemia   . Unspecified hearing loss   . Insomnia, unspecified   . Cataract     Past Surgical History  Procedure Laterality Date  . Back surgery    . Colon surgery      2001  . Cardioversion  11/06/2011    Procedure: CARDIOVERSION;  Surgeon: Ronald Payne;  Location: [facility] OR;  Service: Cardiovascular;  Laterality: N/A;  . Colectomy  2001    Ronald Payne    Family History  Problem Relation Age of Onset  . Heart disease Mother     History   Social History  . Marital Status: Married    Spouse Name: N/A    Number of Children: N/A  . Years of Education: N/A   Occupational History  . Not on file.   Social History Main Topics  . Smoking status: Former Games developer  . Smokeless tobacco: Not on file     Comment: quit years ago "  . Alcohol Use: No  . Drug Use: No  . Sexually  Active: Not Currently   Other Topics Concern  . Not on file   Social History Narrative  . No narrative on file    Review of systems: The patient specifically denies any chest pain at rest exertion, dyspnea at rest or with exertion, orthopnea, paroxysmal nocturnal dyspnea, syncope, palpitations, focal neurological deficits, intermittent claudication, lower extremity edema, unexplained weight gain, cough, hemoptysis or wheezing.   PHYSICAL EXAM BP 142/88  Pulse 61  Resp 16  Ht 6' (1.829 m)  Wt 199 lb (90.266 kg)  BMI 26.98 kg/m2  General: Alert, oriented x3, no distress Head: no evidence of trauma, PERRL, EOMI, no exophtalmos or lid lag, no myxedema, no xanthelasma; normal ears, Payne and oropharynx Neck: normal jugular venous pulsations and no hepatojugular reflux; brisk carotid pulses without delay and no carotid bruits Chest: clear to auscultation, no signs of consolidation by percussion or palpation, normal fremitus, symmetrical and full respiratory excursions Cardiovascular: normal position and quality of the apical impulse, regular rhythm, normal first and second heart sounds, no murmurs, rubs or gallops Abdomen: no tenderness or distention, no masses by palpation, no abnormal pulsatility or arterial bruits, normal bowel sounds, no hepatosplenomegaly Extremities: no clubbing, cyanosis or edema; 2+ radial, ulnar and brachial pulses bilaterally; 2+ right femoral, posterior tibial and dorsalis pedis pulses; 2+ left femoral, posterior tibial and dorsalis pedis pulses; no subclavian or femoral bruits Neurological: grossly nonfocal   EKG: Sinus rhythm, normal tracing  Lipid Panel     Component Value Date/Time   CHOL  Value: 128        ATP III CLASSIFICATION:  <200     mg/dL   Desirable  161-096  mg/dL   Borderline High  >=045    mg/dL   High 40/06/8118 1478   TRIG 50 09/29/2007 0415   HDL 46 09/29/2007 0415   CHOLHDL 2.8 09/29/2007 0415   VLDL 10 09/29/2007 0415   LDLCALC  Value: 72         Total Cholesterol/HDL:CHD Risk Coronary Heart Disease Risk Table                     Men   Women  1/2 Average Risk   3.4   3.3 09/29/2007 0415    BMET    Component Value Date/Time   NA 136 11/05/2011 1337   K 4.5 11/05/2011 1337   CL 103 11/05/2011 1337   CO2 23 11/05/2011 1337  GLUCOSE 111* 11/05/2011 1337   BUN 20 11/05/2011 1337   CREATININE 1.58* 11/05/2011 1337   CALCIUM 9.9 11/05/2011 1337   GFRNONAA 38* 11/05/2011 1337   GFRAA 44* 11/05/2011 1337     ASSESSMENT AND PLAN PAF (paroxysmal atrial fibrillation), recurrent symptomatic AF this admission No symptomatic recurrences in a year and a half. Tolerates multaq. Therapeutically anticoagulated with warfarin without complications.  NICM (nonischemic cardiomyopathy), 45-50% 2D 2013 No clinical evidence of congestive heart failure. On carvedilol.  Diabetes mellitus, Type 2 NIDDM A1c 5.5-5.8%  HTN (hypertension), hypotensive in AF In the past he has had hypotension and atrial fibrillation. Today his blood pressure is good. When I rechecked it it was lower at 136/81 mm Hg.   no changes I make his medicines. He'll followup on a yearly basis. No orders of the defined types were placed in this encounter.   No orders of the defined types were placed in this encounter.    Ronald Silk, MD, [facility] [phone] office [phone] pager

## 2013-06-04 NOTE — Assessment & Plan Note (Signed)
No symptomatic recurrences in a year and a half. Tolerates multaq. Therapeutically anticoagulated with warfarin without complications.

## 2013-06-04 NOTE — Assessment & Plan Note (Signed)
In the past he has had hypotension and atrial fibrillation. Today his blood pressure is good. When I rechecked it it was lower at 136/81 mm Hg.

## 2013-06-09 ENCOUNTER — Ambulatory Visit (INDEPENDENT_AMBULATORY_CARE_PROVIDER_SITE_OTHER): Payer: Medicare Other | Admitting: Pharmacist Clinician (PhC)/ Clinical Pharmacy Specialist

## 2013-06-09 VITALS — BP 140/88 | HR 76

## 2013-06-09 DIAGNOSIS — I4891 Unspecified atrial fibrillation: Secondary | ICD-10-CM

## 2013-06-09 DIAGNOSIS — I480 Paroxysmal atrial fibrillation: Secondary | ICD-10-CM

## 2013-06-09 DIAGNOSIS — Z7901 Long term (current) use of anticoagulants: Secondary | ICD-10-CM

## 2013-06-11 ENCOUNTER — Ambulatory Visit: Payer: Medicare Other | Admitting: Pharmacist Clinician (PhC)/ Clinical Pharmacy Specialist

## 2013-06-18 ENCOUNTER — Other Ambulatory Visit: Payer: Self-pay | Admitting: Cardiovascular Disease

## 2013-06-18 NOTE — Telephone Encounter (Signed)
Rx was sent to pharmacy electronically. 

## 2013-06-23 ENCOUNTER — Ambulatory Visit (INDEPENDENT_AMBULATORY_CARE_PROVIDER_SITE_OTHER): Payer: Medicare Other | Admitting: Pharmacist Clinician (PhC)/ Clinical Pharmacy Specialist

## 2013-06-23 ENCOUNTER — Ambulatory Visit: Payer: Medicare Other | Admitting: Pharmacist Clinician (PhC)/ Clinical Pharmacy Specialist

## 2013-06-23 VITALS — BP 134/80 | HR 84

## 2013-06-23 DIAGNOSIS — I4891 Unspecified atrial fibrillation: Secondary | ICD-10-CM

## 2013-06-23 DIAGNOSIS — Z7901 Long term (current) use of anticoagulants: Secondary | ICD-10-CM

## 2013-06-23 DIAGNOSIS — I480 Paroxysmal atrial fibrillation: Secondary | ICD-10-CM

## 2013-07-21 ENCOUNTER — Ambulatory Visit (INDEPENDENT_AMBULATORY_CARE_PROVIDER_SITE_OTHER): Payer: Medicare Other | Admitting: Pharmacist Clinician (PhC)/ Clinical Pharmacy Specialist

## 2013-07-21 VITALS — BP 120/80 | HR 72

## 2013-07-21 DIAGNOSIS — Z7901 Long term (current) use of anticoagulants: Secondary | ICD-10-CM

## 2013-07-21 DIAGNOSIS — I480 Paroxysmal atrial fibrillation: Secondary | ICD-10-CM

## 2013-07-21 DIAGNOSIS — I4891 Unspecified atrial fibrillation: Secondary | ICD-10-CM

## 2013-07-21 LAB — POCT INR: INR: 2.7

## 2013-08-05 ENCOUNTER — Telehealth: Payer: Self-pay | Admitting: Cardiovascular Disease

## 2013-08-05 MED ORDER — DRONEDARONE HCL 400 MG PO TABS: 400.0000 mg | ORAL_TABLET | Freq: Two times a day (BID) | ORAL | Status: DC

## 2013-08-05 NOTE — Telephone Encounter (Signed)
Sent refill e-sribed.  180 tablets w/3refills. Patient aware

## 2013-08-05 NOTE — Telephone Encounter (Signed)
Trying to get multaq filled   [facility]  400 mg twice a day  Need filled asap  Please call

## 2013-08-19 ENCOUNTER — Ambulatory Visit (INDEPENDENT_AMBULATORY_CARE_PROVIDER_SITE_OTHER): Payer: Medicare Other | Admitting: Pharmacist Clinician (PhC)/ Clinical Pharmacy Specialist

## 2013-08-19 VITALS — BP 144/72 | HR 68

## 2013-08-19 DIAGNOSIS — I4891 Unspecified atrial fibrillation: Secondary | ICD-10-CM

## 2013-08-19 DIAGNOSIS — I480 Paroxysmal atrial fibrillation: Secondary | ICD-10-CM

## 2013-08-19 DIAGNOSIS — Z7901 Long term (current) use of anticoagulants: Secondary | ICD-10-CM

## 2013-08-19 LAB — POCT INR: INR: 3.1

## 2013-09-30 ENCOUNTER — Ambulatory Visit (INDEPENDENT_AMBULATORY_CARE_PROVIDER_SITE_OTHER): Payer: Medicare Other | Admitting: Pharmacist Clinician (PhC)/ Clinical Pharmacy Specialist

## 2013-09-30 VITALS — BP 114/70 | HR 64 | Wt 197.0 lb

## 2013-09-30 DIAGNOSIS — I480 Paroxysmal atrial fibrillation: Secondary | ICD-10-CM

## 2013-09-30 DIAGNOSIS — I4891 Unspecified atrial fibrillation: Secondary | ICD-10-CM

## 2013-09-30 DIAGNOSIS — Z7901 Long term (current) use of anticoagulants: Secondary | ICD-10-CM

## 2013-09-30 LAB — POCT INR: INR: 2.6

## 2013-10-18 ENCOUNTER — Other Ambulatory Visit: Payer: Self-pay | Admitting: Pharmacist Clinician (PhC)/ Clinical Pharmacy Specialist

## 2013-10-18 MED ORDER — DRONEDARONE HCL 400 MG PO TABS: 400.0000 mg | ORAL_TABLET | Freq: Two times a day (BID) | ORAL | Status: DC

## 2013-11-11 ENCOUNTER — Ambulatory Visit (INDEPENDENT_AMBULATORY_CARE_PROVIDER_SITE_OTHER): Payer: Medicare Other | Admitting: Pharmacist Clinician (PhC)/ Clinical Pharmacy Specialist

## 2013-11-11 ENCOUNTER — Other Ambulatory Visit: Payer: Self-pay | Admitting: Pharmacist Clinician (PhC)/ Clinical Pharmacy Specialist

## 2013-11-11 VITALS — BP 120/74 | HR 72

## 2013-11-11 DIAGNOSIS — I480 Paroxysmal atrial fibrillation: Secondary | ICD-10-CM

## 2013-11-11 DIAGNOSIS — Z7901 Long term (current) use of anticoagulants: Secondary | ICD-10-CM

## 2013-11-11 DIAGNOSIS — I4891 Unspecified atrial fibrillation: Secondary | ICD-10-CM

## 2013-11-11 LAB — POCT INR: INR: 2.8

## 2013-11-11 MED ORDER — CARVEDILOL 6.25 MG PO TABS: ORAL_TABLET | ORAL | Status: DC

## 2013-11-11 MED ORDER — DRONEDARONE HCL 400 MG PO TABS: 400.0000 mg | ORAL_TABLET | Freq: Two times a day (BID) | ORAL | Status: DC

## 2013-11-11 MED ORDER — COUMADIN 5 MG PO TABS: 5.0000 mg | ORAL_TABLET | Freq: Every day | ORAL | Status: DC

## 2013-11-16 ENCOUNTER — Telehealth: Payer: Self-pay | Admitting: Cardiovascular Disease

## 2013-11-16 NOTE — Telephone Encounter (Signed)
Need his handicap sticker filled out.Does he need an appt for this or just bring it up here?

## 2013-11-16 NOTE — Telephone Encounter (Signed)
Returned call and pt verified x 2.  Pt informed message received and per Britta Mccreedy, he can drop form off.  Pt informed she will contact him will it is complete.  Pt verbalized understanding and agreed w/ plan.  Pt will drop form off today.  Pt stated he has SOB and uses a walker as reasons for needing handicap sticker.

## 2013-11-17 NOTE — Telephone Encounter (Signed)
Handicap form filled out and patient notified it was ready for pick-up.

## 2013-12-23 ENCOUNTER — Ambulatory Visit: Payer: Medicare Other | Admitting: Pharmacist Clinician (PhC)/ Clinical Pharmacy Specialist

## 2013-12-29 ENCOUNTER — Ambulatory Visit (INDEPENDENT_AMBULATORY_CARE_PROVIDER_SITE_OTHER): Payer: Medicare Other | Admitting: Pharmacist Clinician (PhC)/ Clinical Pharmacy Specialist

## 2013-12-29 VITALS — BP 150/82 | HR 60

## 2013-12-29 DIAGNOSIS — I4891 Unspecified atrial fibrillation: Secondary | ICD-10-CM

## 2013-12-29 DIAGNOSIS — I480 Paroxysmal atrial fibrillation: Secondary | ICD-10-CM

## 2013-12-29 DIAGNOSIS — Z7901 Long term (current) use of anticoagulants: Secondary | ICD-10-CM

## 2013-12-29 LAB — POCT INR: INR: 2.9

## 2014-01-14 ENCOUNTER — Encounter (HOSPITAL_COMMUNITY): Payer: Self-pay | Admitting: Emergency Medicine

## 2014-01-14 ENCOUNTER — Inpatient Hospital Stay (HOSPITAL_COMMUNITY)
Admission: EM | Admit: 2014-01-14 | Discharge: 2014-01-16 | DRG: 690 | Disposition: A | Discharge status: Home Health | Payer: Medicare Other | Attending: Internal Medicine | Admitting: Internal Medicine

## 2014-01-14 DIAGNOSIS — N400 Enlarged prostate without lower urinary tract symptoms: Secondary | ICD-10-CM

## 2014-01-14 DIAGNOSIS — I4892 Unspecified atrial flutter: Secondary | ICD-10-CM

## 2014-01-14 DIAGNOSIS — I951 Orthostatic hypotension: Secondary | ICD-10-CM | POA: Diagnosis present

## 2014-01-14 DIAGNOSIS — Z87891 Personal history of nicotine dependence: Secondary | ICD-10-CM

## 2014-01-14 DIAGNOSIS — Z789 Other specified health status: Secondary | ICD-10-CM | POA: Diagnosis present

## 2014-01-14 DIAGNOSIS — N390 Urinary tract infection, site not specified: Principal | ICD-10-CM | POA: Diagnosis present

## 2014-01-14 DIAGNOSIS — I428 Other cardiomyopathies: Secondary | ICD-10-CM | POA: Diagnosis present

## 2014-01-14 DIAGNOSIS — R05 Cough: Secondary | ICD-10-CM | POA: Diagnosis present

## 2014-01-14 DIAGNOSIS — R059 Cough, unspecified: Secondary | ICD-10-CM | POA: Diagnosis present

## 2014-01-14 DIAGNOSIS — G4700 Insomnia, unspecified: Secondary | ICD-10-CM | POA: Diagnosis present

## 2014-01-14 DIAGNOSIS — I480 Paroxysmal atrial fibrillation: Secondary | ICD-10-CM | POA: Diagnosis present

## 2014-01-14 DIAGNOSIS — N183 Chronic kidney disease, stage 3 unspecified: Secondary | ICD-10-CM

## 2014-01-14 DIAGNOSIS — A498 Other bacterial infections of unspecified site: Secondary | ICD-10-CM | POA: Diagnosis present

## 2014-01-14 DIAGNOSIS — I10 Essential (primary) hypertension: Secondary | ICD-10-CM | POA: Diagnosis present

## 2014-01-14 DIAGNOSIS — E119 Type 2 diabetes mellitus without complications: Secondary | ICD-10-CM | POA: Diagnosis present

## 2014-01-14 DIAGNOSIS — D72829 Elevated white blood cell count, unspecified: Secondary | ICD-10-CM

## 2014-01-14 DIAGNOSIS — R791 Abnormal coagulation profile: Secondary | ICD-10-CM

## 2014-01-14 DIAGNOSIS — Z7901 Long term (current) use of anticoagulants: Secondary | ICD-10-CM

## 2014-01-14 DIAGNOSIS — I4891 Unspecified atrial fibrillation: Secondary | ICD-10-CM | POA: Diagnosis present

## 2014-01-14 DIAGNOSIS — H9190 Unspecified hearing loss, unspecified ear: Secondary | ICD-10-CM | POA: Diagnosis present

## 2014-01-14 DIAGNOSIS — D649 Anemia, unspecified: Secondary | ICD-10-CM

## 2014-01-14 LAB — PROTIME-INR
INR: 3.81 — ABNORMAL HIGH (ref 0.00–1.49)
Prothrombin Time: 36.1 seconds — ABNORMAL HIGH (ref 11.6–15.2)

## 2014-01-14 LAB — CBC WITH DIFFERENTIAL/PLATELET
Basophils Absolute: 0 10*3/uL (ref 0.0–0.1)
Basophils Relative: 0 % (ref 0–1)
EOS PCT: 1 % (ref 0–5)
Eosinophils Absolute: 0.1 10*3/uL (ref 0.0–0.7)
HEMATOCRIT: 31 % — AB (ref 39.0–52.0)
HEMOGLOBIN: 10.6 g/dL — AB (ref 13.0–17.0)
LYMPHS ABS: 1.5 10*3/uL (ref 0.7–4.0)
LYMPHS PCT: 9 % — AB (ref 12–46)
MCH: 31.2 pg (ref 26.0–34.0)
MCHC: 34.2 g/dL (ref 30.0–36.0)
MCV: 91.2 fL (ref 78.0–100.0)
MONOS PCT: 11 % (ref 3–12)
Monocytes Absolute: 1.9 10*3/uL — ABNORMAL HIGH (ref 0.1–1.0)
Neutro Abs: 13.5 10*3/uL — ABNORMAL HIGH (ref 1.7–7.7)
Neutrophils Relative %: 80 % — ABNORMAL HIGH (ref 43–77)
PLATELETS: 228 10*3/uL (ref 150–400)
RBC: 3.4 MIL/uL — AB (ref 4.22–5.81)
RDW: 14.7 % (ref 11.5–15.5)
WBC: 17 10*3/uL — AB (ref 4.0–10.5)

## 2014-01-14 LAB — URINALYSIS, ROUTINE W REFLEX MICROSCOPIC
Bilirubin Urine: NEGATIVE
GLUCOSE, UA: NEGATIVE mg/dL
Ketones, ur: NEGATIVE mg/dL
Nitrite: POSITIVE — AB
PH: 6 (ref 5.0–8.0)
PROTEIN: NEGATIVE mg/dL
Specific Gravity, Urine: 1.022 (ref 1.005–1.030)
Urobilinogen, UA: 0.2 mg/dL (ref 0.0–1.0)

## 2014-01-14 LAB — GLUCOSE, CAPILLARY
Glucose-Capillary: 93 mg/dL (ref 70–99)
Glucose-Capillary: 106 mg/dL — ABNORMAL HIGH (ref 70–99)

## 2014-01-14 LAB — BASIC METABOLIC PANEL
BUN: 21 mg/dL (ref 6–23)
CHLORIDE: 102 meq/L (ref 96–112)
CO2: 21 mEq/L (ref 19–32)
Calcium: 9.6 mg/dL (ref 8.4–10.5)
Creatinine, Ser: 1.22 mg/dL (ref 0.50–1.35)
GFR calc non Af Amer: 51 mL/min — ABNORMAL LOW (ref >90)
GFR, EST AFRICAN AMERICAN: 59 mL/min — AB (ref >90)
GLUCOSE: 103 mg/dL — AB (ref 70–99)
POTASSIUM: 4.7 meq/L (ref 3.7–5.3)
Sodium: 137 mEq/L (ref 137–147)

## 2014-01-14 LAB — POC OCCULT BLOOD, ED: Fecal Occult Bld: NEGATIVE

## 2014-01-14 LAB — URINE MICROSCOPIC-ADD ON

## 2014-01-14 MED ORDER — LORATADINE 10 MG PO TABS
10.0000 mg | ORAL_TABLET | Freq: Every day | ORAL | Status: DC
  Administered 2014-01-15 – 2014-01-16 (×2): 10 mg via ORAL
  Filled 2014-01-14 (×2): qty 1

## 2014-01-14 MED ORDER — ONDANSETRON HCL 4 MG PO TABS: 4.0000 mg | ORAL_TABLET | Freq: Four times a day (QID) | ORAL | Status: DC | PRN

## 2014-01-14 MED ORDER — DOCUSATE SODIUM 100 MG PO CAPS
100.0000 mg | ORAL_CAPSULE | Freq: Two times a day (BID) | ORAL | Status: DC
  Administered 2014-01-14 – 2014-01-16 (×3): 100 mg via ORAL
  Filled 2014-01-14 (×5): qty 1

## 2014-01-14 MED ORDER — SIMVASTATIN 40 MG PO TABS: 40.0000 mg | ORAL_TABLET | Freq: Every day | ORAL | Status: DC

## 2014-01-14 MED ORDER — DRONEDARONE HCL 400 MG PO TABS
400.0000 mg | ORAL_TABLET | Freq: Two times a day (BID) | ORAL | Status: DC
  Administered 2014-01-15 – 2014-01-16 (×3): 400 mg via ORAL
  Filled 2014-01-14 (×5): qty 1

## 2014-01-14 MED ORDER — GUAIFENESIN-DM 100-10 MG/5ML PO SYRP
5.0000 mL | ORAL_SOLUTION | ORAL | Status: DC | PRN
  Filled 2014-01-14: qty 5

## 2014-01-14 MED ORDER — ALUM & MAG HYDROXIDE-SIMETH 200-200-20 MG/5ML PO SUSP: 30.0000 mL | Freq: Four times a day (QID) | ORAL | Status: DC | PRN

## 2014-01-14 MED ORDER — LEVOTHYROXINE SODIUM 50 MCG PO TABS
50.0000 ug | ORAL_TABLET | Freq: Every day | ORAL | Status: DC
  Administered 2014-01-15 – 2014-01-16 (×2): 50 ug via ORAL
  Filled 2014-01-14 (×3): qty 1

## 2014-01-14 MED ORDER — ONDANSETRON HCL 4 MG/2ML IJ SOLN: 4.0000 mg | Freq: Four times a day (QID) | INTRAMUSCULAR | Status: DC | PRN

## 2014-01-14 MED ORDER — BENZONATATE 100 MG PO CAPS
100.0000 mg | ORAL_CAPSULE | Freq: Three times a day (TID) | ORAL | Status: DC | PRN
  Filled 2014-01-14: qty 1

## 2014-01-14 MED ORDER — ACETAMINOPHEN 325 MG PO TABS
650.0000 mg | ORAL_TABLET | Freq: Four times a day (QID) | ORAL | Status: DC | PRN
  Administered 2014-01-15 (×2): 650 mg via ORAL
  Filled 2014-01-14 (×3): qty 2

## 2014-01-14 MED ORDER — DEXTROSE 5 % IV SOLN
1.0000 g | Freq: Once | INTRAVENOUS | Status: AC
  Administered 2014-01-14: 1 g via INTRAVENOUS
  Filled 2014-01-14: qty 10

## 2014-01-14 MED ORDER — ATORVASTATIN CALCIUM 20 MG PO TABS
20.0000 mg | ORAL_TABLET | Freq: Every day | ORAL | Status: DC
  Administered 2014-01-14 – 2014-01-15 (×2): 20 mg via ORAL
  Filled 2014-01-14 (×3): qty 1

## 2014-01-14 MED ORDER — HYDROCODONE-ACETAMINOPHEN 5-325 MG PO TABS
1.0000 | ORAL_TABLET | Freq: Four times a day (QID) | ORAL | Status: DC | PRN
  Administered 2014-01-14 – 2014-01-16 (×2): 1 via ORAL
  Filled 2014-01-14 (×2): qty 1

## 2014-01-14 MED ORDER — CARVEDILOL 6.25 MG PO TABS
6.2500 mg | ORAL_TABLET | Freq: Two times a day (BID) | ORAL | Status: DC
  Administered 2014-01-15 – 2014-01-16 (×3): 6.25 mg via ORAL
  Filled 2014-01-14 (×5): qty 1

## 2014-01-14 MED ORDER — MORPHINE SULFATE 2 MG/ML IJ SOLN: 1.0000 mg | INTRAMUSCULAR | Status: DC | PRN

## 2014-01-14 MED ORDER — INSULIN ASPART 100 UNIT/ML ~~LOC~~ SOLN
0.0000 [IU] | Freq: Three times a day (TID) | SUBCUTANEOUS | Status: DC
  Administered 2014-01-16: 2 [IU] via SUBCUTANEOUS

## 2014-01-14 MED ORDER — SODIUM CHLORIDE 0.9 % IV SOLN
INTRAVENOUS | Status: DC
  Administered 2014-01-14 – 2014-01-15 (×2): via INTRAVENOUS

## 2014-01-14 MED ORDER — WARFARIN SODIUM 2.5 MG PO TABS: 2.5000 mg | ORAL_TABLET | Freq: Every day | ORAL | Status: DC

## 2014-01-14 MED ORDER — ACETAMINOPHEN 650 MG RE SUPP: 650.0000 mg | Freq: Four times a day (QID) | RECTAL | Status: DC | PRN

## 2014-01-14 NOTE — Progress Notes (Signed)
Pt assignment received at 1715. Called ED for report at 1715. Report completed at 1730. Awaiting pt arrival to floor.

## 2014-01-14 NOTE — ED Notes (Signed)
Per EMS: Pt from PCP with c/o generalized weakness x 3 weeks, decreased appetite and wife reports confusion/hallucination 2 das ago. Pt now AO x4. Denies F/C/cough, change in urination, hematuria, dysuria. Hx: A fib (takes Coumadin), HTN. WBC 15.7

## 2014-01-14 NOTE — ED Notes (Signed)
Pt states that he has generalized weakness and decreased PO intake x 3 weeks. Report dizziness when going from laying to standing, but neuro intact. Grips equal bilaterally. Pt denies any pain. Pt self caths, states he has decreased urinal output. Denies hematuria. Pt in A fib on monitor.

## 2014-01-14 NOTE — Progress Notes (Signed)
Pt admitted to the unit at 1740. Pt mental status is a&0x4. Pt oriented to room, staff, and call bell. Skin is intact. Full assessment charted in [facility]. Call bell within reach. Visitor guidelines reviewed w/ pt and/or family.

## 2014-01-14 NOTE — ED Provider Notes (Signed)
CSN: [id number]     Arrival date & time 01/14/14  1251 History   First MD Initiated Contact with Patient 01/14/14 1304     Chief Complaint  Patient presents with  . Fatigue     (Consider location/radiation/quality/duration/timing/severity/associated sxs/prior Treatment) HPI   Ronald Payne Is an 80 year old male who presents emergency department with chief complaint of fatigue.  Patient has a past medical history of atrial fibrillation, diabetes, hypertension. He is on Coumadin for anticoagulation.  He gets checked every 6 weeks does not make any medication changes recently. Patient states that over the past 4 days he has "not felt like myself."  Patient states that he has been extremely fatigued.  He is a using a walker after the arthroscopic knee surgery several months ago has been unable to get off of the walker. Patient is to self catheterize at home for urination.  He denies any pain or burning with urination, blood in the urine.  Patient denies any weight loss, soaking night sweats.  He denies any headaches, changes in vision, unilateral weakness, difficulty with speech.  He states he has had some mild confusion.  Patient denies any dark tarry colored stools or bloody stools.  He takes his medications as directed.patient denies chest pain, shortness of breath, peripheral edema.  Past Medical History  Diagnosis Date  . Shortness of breath   . Hypothyroidism   . Hypertension   . Dysrhythmia     atrial fibrilation  . Arthritis   . Diabetes mellitus     type 2  . Insomnia, unspecified   . Acute bronchitis   . Other and unspecified hyperlipidemia   . Unspecified hearing loss   . Insomnia, unspecified   . Cataract    Past Surgical History  Procedure Laterality Date  . Back surgery    . Colon surgery      2001  . Cardioversion  11/06/2011    Procedure: CARDIOVERSION;  Surgeon: Chrystie NoseKenneth C. Hilty;  Location: [facility] OR;  Service: Cardiovascular;  Laterality: N/A;  . Colectomy  2001     Dr. [name]   Family History  Problem Relation Age of Onset  . Heart disease Mother    History  Substance Use Topics  . Smoking status: Former Smoker    Quit date: 01/14/1994  . Smokeless tobacco: Not on file     Comment: quit years ago "  . Alcohol Use: No    Review of Systems  Ten systems reviewed and are negative for acute change, except as noted in the HPI.    Allergies  Amiodarone  Home Medications   Current Outpatient Rx  Name  Route  Sig  Dispense  Refill  . benzonatate (TESSALON) 100 MG capsule   Oral   Take 100 mg by mouth 3 (three) times daily as needed for cough.         . carvedilol (COREG) 6.25 MG tablet   Oral   Take 6.25 mg by mouth 2 (two) times daily with a meal.         . cetirizine (ZYRTEC) 10 MG tablet   Oral   Take 10 mg by mouth daily. Take 1 tablet daily for allergies.         Marland Kitchen. docusate sodium (COLACE) 100 MG capsule   Oral   Take 100 mg by mouth 3 (three) times daily as needed. For constipation          . dronedarone (MULTAQ) 400 MG tablet   Oral  Take 1 tablet (400 mg total) by mouth 2 (two) times daily with a meal.   180 tablet   3   . Glucosamine-Chondroitin (COSAMIN DS PO)   Oral   Take 1 tablet by mouth 2 (two) times daily.           Marland Kitchen HYDROcodone-acetaminophen (NORCO/VICODIN) 5-325 MG per tablet   Oral   Take 1 tablet by mouth every 6 (six) hours as needed for moderate pain.         Marland Kitchen levothyroxine (SYNTHROID, LEVOTHROID) 50 MCG tablet   Oral   Take 50 mcg by mouth daily.           Marland Kitchen lovastatin (MEVACOR) 40 MG tablet   Oral   Take 40 mg by mouth at bedtime. Take 40 mg daily to control cholesterol.         . metFORMIN (GLUCOPHAGE) 500 MG tablet   Oral   Take 500 mg by mouth 2 (two) times daily with a meal. Take 1 tablet twice daily for blood sugar.         . warfarin (COUMADIN) 5 MG tablet   Oral   Take 2.5-5 mg by mouth daily. Take 2.5mg  on Tues & Fri, All other days including Sunday take 5mg            BP 125/60  Pulse 69  Temp(Src) 98.2 F (36.8 C) (Oral)  Resp 25  SpO2 97% Physical Exam Physical Exam  Nursing note and vitals reviewed. Constitutional: He appears well-developed and well-nourished.  Pale appearance. HENT:  Head: Normocephalic and atraumatic.  Eyes: Conjunctivae pale. No scleral icterus.  Neck: Normal range of motion. Neck supple. no JVD Cardiovascular: Normal rate, regular rhythm and normal heart sounds.   Pulmonary/Chest: Effort normal and breath sounds normal. No respiratory distress.  Abdominal: Soft. There is no tenderness.  Musculoskeletal: He exhibits no edema.  Neurological: He is alert.  Skin: Skin is warm and dry. He is not diaphoretic. multiple petechiae on the arms and legs.  No palmar or plantar involvement.  No mucosal involvement Digital Rectal Exam reveals sphincter with good tone. No external hemorrhoids. No masses or fissures. Stool color is brown with no overt blood. Negative occult stool cards. Psychiatric: His behavior is normal.    ED Course  Procedures (including critical care time) Labs Review Labs Reviewed  CBC WITH DIFFERENTIAL - Abnormal; Notable for the following:    WBC 17.0 (*)    RBC 3.40 (*)    Hemoglobin 10.6 (*)    HCT 31.0 (*)    Neutrophils Relative % 80 (*)    Neutro Abs 13.5 (*)    Lymphocytes Relative 9 (*)    Monocytes Absolute 1.9 (*)    All other components within normal limits  BASIC METABOLIC PANEL - Abnormal; Notable for the following:    Glucose, Bld 103 (*)    GFR calc non Af Amer 51 (*)    GFR calc Af Amer 59 (*)    All other components within normal limits  PROTIME-INR - Abnormal; Notable for the following:    Prothrombin Time 36.1 (*)    INR 3.81 (*)    All other components within normal limits  URINALYSIS, ROUTINE W REFLEX MICROSCOPIC - Abnormal; Notable for the following:    APPearance CLOUDY (*)    Hgb urine dipstick TRACE (*)    Nitrite POSITIVE (*)    Leukocytes, UA MODERATE (*)     All other components within normal limits  URINE MICROSCOPIC-ADD  ON - Abnormal; Notable for the following:    Bacteria, UA MANY (*)    All other components within normal limits  URINE CULTURE  OCCULT BLOOD X 1 CARD TO LAB, STOOL  POC OCCULT BLOOD, ED   Imaging Review No results found.   EKG Interpretation None      Date: 01/14/2014  Rate:68  Rhythm: atrial fibrillation  QRS Axis: normal  Intervals: normal  ST/T Wave abnormalities: normal  Conduction Disutrbances:none  Narrative Interpretation: Afib- rate controlled  Old EKG Reviewed: changes noted Afib this time. Last with NSR. Previous EKGs show afib.    MDM   Final diagnoses:  UTI (lower urinary tract infection)  Leukocytosis  Anemia  Orthostasis  Supratherapeutic INR    3:05 PM Patient with UTI.  No signs of sepsis. No flank pain.  Mild anemia.    3:52 PM I spoke with the patient and his wife. He is feelingtoo weak to go home and they both would perfer admission at this point. He will need to hold his coumadin and have a recheck of his inr. +orthostatic.   4:15 PM Patient will be admitted to inpatient by Dr. Arthor Captain. The patient appears reasonably stabilized for admission considering the current resources, flow, and capabilities available in the ED at this time, and I doubt any other [facility] requiring further screening and/or treatment in the ED prior to admission.   Arthor Captain, PA-C 01/14/14 1615

## 2014-01-14 NOTE — ED Notes (Signed)
MD Elmahi at bedside.

## 2014-01-14 NOTE — Progress Notes (Deleted)
[facility] History and Physical  Ronald Payne EAV:[id number] DOB: [birth year] DOA: 01/14/2014  Referring physician: Arthor CaptainAbigail Harris, PA-C PCP: Aura DialsBOUSKA,DAVID E, MD   Chief Complaint: Dizzy Presented from: Home with wife  HPI: Ronald Payne is a 80 y.o. male with past medical history of atrial fibrillation, DM 2 and arthritis. He is a very pleasant [employer] veteran. Patient sent from his primary care physician office because of dizziness and suspicion of UTI. Patient mentioned that for the past week he's got progressively weak, he also got confused for the past 2 days and developed dizziness when he stands. She went to his PCP office today and he sent to the ED for further evaluation. In the ED he was found to have orthostatic hypotension, his urinalysis consistent with UTI and has supratherapeutic INR of 3.8 without evidence of bleeding.  Review of Systems:  Constitutional: Generalized weakness and dizziness Eyes: negative for irritation, redness and visual disturbance Ears, nose, mouth, throat, and face: negative for earaches, epistaxis, nasal congestion and sore throat Respiratory: negative for cough, dyspnea on exertion, sputum and wheezing Cardiovascular: negative for chest pain, dyspnea, lower extremity edema, orthopnea, palpitations and syncope Gastrointestinal: negative for abdominal pain, constipation, diarrhea, melena, nausea and vomiting Genitourinary:negative for dysuria, frequency and hematuria Hematologic/lymphatic: negative for bleeding, easy bruising and lymphadenopathy Musculoskeletal:negative for arthralgias, muscle weakness and stiff joints Neurological: negative for coordination problems, gait problems, headaches and weakness Endocrine: negative for diabetic symptoms including polydipsia, polyuria and weight loss Allergic/Immunologic: negative for anaphylaxis, hay fever and urticaria  Past Medical History  Diagnosis Date  . Shortness of breath   .  Hypothyroidism   . Hypertension   . Dysrhythmia     atrial fibrilation  . Arthritis   . Diabetes mellitus     type 2  . Insomnia, unspecified   . Acute bronchitis   . Other and unspecified hyperlipidemia   . Unspecified hearing loss   . Insomnia, unspecified   . Cataract    Past Surgical History  Procedure Laterality Date  . Back surgery    . Colon surgery      2001  . Cardioversion  11/06/2011    Procedure: CARDIOVERSION;  Surgeon: Chrystie NoseKenneth C. Hilty;  Location: [facility] OR;  Service: Cardiovascular;  Laterality: N/A;  . Colectomy  2001    Dr. [name]   Social History:   reports that he quit smoking about 20 years ago. He does not have any smokeless tobacco history on file. He reports that he does not drink alcohol or use illicit drugs.  Allergies  Allergen Reactions  . Amiodarone Other (See Comments)    Hyperthyroid on Amio in the past, changed to Multaq    Family History  Problem Relation Age of Onset  . Heart disease Mother      Prior to Admission medications   Medication Sig Start Date End Date Taking? Authorizing Provider  benzonatate (TESSALON) 100 MG capsule Take 100 mg by mouth 3 (three) times daily as needed for cough.   Yes Historical Provider, MD  carvedilol (COREG) 6.25 MG tablet Take 6.25 mg by mouth 2 (two) times daily with a meal.   Yes Historical Provider, MD  cetirizine (ZYRTEC) 10 MG tablet Take 10 mg by mouth daily. Take 1 tablet daily for allergies.   Yes Historical Provider, MD  docusate sodium (COLACE) 100 MG capsule Take 100 mg by mouth 3 (three) times daily as needed. For constipation    Yes Historical Provider, MD  dronedarone (MULTAQ)  400 MG tablet Take 1 tablet (400 mg total) by mouth 2 (two) times daily with a meal. 11/11/13  Yes Mihai Croitoru, MD  Glucosamine-Chondroitin (COSAMIN DS PO) Take 1 tablet by mouth 2 (two) times daily.     Yes Historical Provider, MD  HYDROcodone-acetaminophen (NORCO/VICODIN) 5-325 MG per tablet Take 1 tablet by mouth every  6 (six) hours as needed for moderate pain.   Yes Historical Provider, MD  levothyroxine (SYNTHROID, LEVOTHROID) 50 MCG tablet Take 50 mcg by mouth daily.     Yes Historical Provider, MD  lovastatin (MEVACOR) 40 MG tablet Take 40 mg by mouth at bedtime. Take 40 mg daily to control cholesterol.   Yes Historical Provider, MD  metFORMIN (GLUCOPHAGE) 500 MG tablet Take 500 mg by mouth 2 (two) times daily with a meal. Take 1 tablet twice daily for blood sugar.   Yes Historical Provider, MD  warfarin (COUMADIN) 5 MG tablet Take 2.5-5 mg by mouth daily. Take 2.5mg  on Tues & Fri, All other days including Sunday take 5mg    Yes Historical Provider, MD   Physical Exam: Filed Vitals:   01/14/14 1630  BP: 151/86  Pulse: 67  Temp:   Resp: 19   Constitutional: He is oriented to person, place, and time. He appears well-developed and well-nourished. He is cooperative.  Head: Normocephalic and atraumatic.  Nose: Nose normal.  Mouth/Throat: Uvula is midline, oropharynx is clear and moist and mucous membranes are normal.  Eyes: Conjunctivae and EOM are normal. Pupils are equal, round, and reactive to light.  Neck: Trachea normal and normal range of motion. Neck supple.  Cardiovascular: Normal rate, regular rhythm, S1 normal, S2 normal, normal heart sounds and intact distal pulses.   Pulmonary/Chest: Effort normal and breath sounds normal.  Abdominal: Soft. Bowel sounds are normal. There is no hepatosplenomegaly. There is no tenderness.  Musculoskeletal: Normal range of motion.  Neurological: He is alert and oriented to person, place, and time. He has normal strength. No cranial nerve deficit or sensory deficit. He displays a negative Romberg sign.  Skin: Skin is warm, dry and intact.  Psychiatric: He has a normal mood and affect. His speech is normal and behavior is normal.   Labs on Admission:  Basic Metabolic Panel:  Recent Labs Lab 01/14/14 1308  NA 137  K 4.7  CL 102  CO2 21  GLUCOSE 103*  BUN  21  CREATININE 1.22  CALCIUM 9.6   Liver Function Tests: No results found for this basename: AST, ALT, ALKPHOS, BILITOT, PROT, ALBUMIN,  in the last 168 hours No results found for this basename: LIPASE, AMYLASE,  in the last 168 hours No results found for this basename: AMMONIA,  in the last 168 hours CBC:  Recent Labs Lab 01/14/14 1308  WBC 17.0*  NEUTROABS 13.5*  HGB 10.6*  HCT 31.0*  MCV 91.2  PLT 228   Cardiac Enzymes: No results found for this basename: CKTOTAL, CKMB, CKMBINDEX, TROPONINI,  in the last 168 hours  BNP (last 3 results) No results found for this basename: PROBNP,  in the last 8760 hours CBG: No results found for this basename: GLUCAP,  in the last 168 hours  Radiological Exams on Admission: No results found.  EKG: Independently reviewed.   Assessment/Plan Active Problems:   PAF (paroxysmal atrial fibrillation), recurrent symptomatic AF this admission   NICM (nonischemic cardiomyopathy), 45-50% 2D 2013   Chronic anticoagulation   Diabetes mellitus   UTI (lower urinary tract infection)   Self-catheterizes urinary bladder  Orthostatic hypotension   UTI -Urinalysis consistent with UTI, this is considered complicated UTI as patient has to self catheterize 5 times a day. -Started on Rocephin in the emergency department, we'll continue.  -Adjust antibiotics according to the urine culture results. Blood cultures not drawn antibiotics already given.  Orthostatic hypotension -Is likely secondary to UTI and dehydration. -Patient started on normal saline at 75 mm. -We'll be very careful with IV fluids as patient has history of nonischemic cardiomyopathy.  Diabetes mellitus type 2 -Check hemoglobin A1c, will be on carbohydrate modified diet. -Insulin sliding scale.  Atrial fibrillation -Patient has paroxysmal atrial fibrillation, rate is controlled with carvedilol and Multaq. -He is on Coumadin, supratherapeutic INR, I have asked pharmacy to manage  Coumadin dosage.  Cough -Patient reported that he has chronic cough, this is not new. -Chest x-ray ordered, continued on his antihistamines.  Code Status:Full code  Family Communication: Plan discussed with the patient in the presence of his wife at bedside.  Disposition Plan: Inpatient, MedSurg  Time spent: 70 minutes  [facility] Pager [phone]

## 2014-01-14 NOTE — Progress Notes (Signed)
ANTICOAGULATION CONSULT NOTE - Initial Consult  Pharmacy Consult for Coumadin Indication: atrial fibrillation  Allergies  Allergen Reactions  . Amiodarone Other (See Comments)    Hyperthyroid on Amio in the past, changed to Multaq    Patient Measurements:   Heparin Dosing Weight:   Vital Signs: Temp: 98.2 F (36.8 C) (03/20 1302) Temp src: Oral (03/20 1302) BP: 138/69 mmHg (03/20 1700) Pulse Rate: 68 (03/20 1700)  Labs:  Recent Labs  01/14/14 1308  HGB 10.6*  HCT 31.0*  PLT 228  LABPROT 36.1*  INR 3.81*  CREATININE 1.22    The CrCl is unknown because both a height and weight (above a minimum accepted value) are required for this calculation.   Medical History: Past Medical History  Diagnosis Date  . Shortness of breath   . Hypothyroidism   . Hypertension   . Dysrhythmia     atrial fibrilation  . Arthritis   . Diabetes mellitus     type 2  . Insomnia, unspecified   . Acute bronchitis   . Other and unspecified hyperlipidemia   . Unspecified hearing loss   . Insomnia, unspecified   . Cataract     Medications:  Prescriptions prior to admission  Medication Sig Dispense Refill  . benzonatate (TESSALON) 100 MG capsule Take 100 mg by mouth 3 (three) times daily as needed for cough.      . carvedilol (COREG) 6.25 MG tablet Take 6.25 mg by mouth 2 (two) times daily with a meal.      . cetirizine (ZYRTEC) 10 MG tablet Take 10 mg by mouth daily. Take 1 tablet daily for allergies.      Marland Kitchen docusate sodium (COLACE) 100 MG capsule Take 100 mg by mouth 3 (three) times daily as needed. For constipation       . dronedarone (MULTAQ) 400 MG tablet Take 1 tablet (400 mg total) by mouth 2 (two) times daily with a meal.  180 tablet  3  . Glucosamine-Chondroitin (COSAMIN DS PO) Take 1 tablet by mouth 2 (two) times daily.        Marland Kitchen HYDROcodone-acetaminophen (NORCO/VICODIN) 5-325 MG per tablet Take 1 tablet by mouth every 6 (six) hours as needed for moderate pain.      Marland Kitchen  levothyroxine (SYNTHROID, LEVOTHROID) 50 MCG tablet Take 50 mcg by mouth daily.        Marland Kitchen lovastatin (MEVACOR) 40 MG tablet Take 40 mg by mouth at bedtime. Take 40 mg daily to control cholesterol.      . metFORMIN (GLUCOPHAGE) 500 MG tablet Take 500 mg by mouth 2 (two) times daily with a meal. Take 1 tablet twice daily for blood sugar.      . warfarin (COUMADIN) 5 MG tablet Take 2.5-5 mg by mouth daily. Take 2.5mg  on Tues & Fri, All other days including Sunday take 5mg         Assessment: generalized weakness, decreased appetite, AMS 80 y/o M presents with generalized weakness; on chronic Coumadin for afib. In afib in ER. UA consistent with UTI in pt that self-catheterizes. WBC elevated 18.   Anticoag: Pt is to continue on Coumadin for afib as PTA. Admit INR 3.8. Admit Hgb only 10.6. Plts 228 ok.   Goal of Therapy:  INR 2-3 Monitor platelets by anticoagulation protocol: Yes   Plan:  Hold Coumadin tonight Daily INR   Chord Takahashi S. Merilynn Finland, PharmD, BCPS Clinical Staff Pharmacist Pager [phone]  Misty Stanley Stillinger 01/14/2014,5:54 PM

## 2014-01-15 ENCOUNTER — Inpatient Hospital Stay (HOSPITAL_COMMUNITY): Payer: Medicare Other

## 2014-01-15 DIAGNOSIS — Z789 Other specified health status: Secondary | ICD-10-CM

## 2014-01-15 DIAGNOSIS — Z7901 Long term (current) use of anticoagulants: Secondary | ICD-10-CM

## 2014-01-15 DIAGNOSIS — R791 Abnormal coagulation profile: Secondary | ICD-10-CM

## 2014-01-15 LAB — CBC
HEMATOCRIT: 28.7 % — AB (ref 39.0–52.0)
HEMOGLOBIN: 9.6 g/dL — AB (ref 13.0–17.0)
MCH: 30.6 pg (ref 26.0–34.0)
MCHC: 33.4 g/dL (ref 30.0–36.0)
MCV: 91.4 fL (ref 78.0–100.0)
Platelets: 157 10*3/uL (ref 150–400)
RBC: 3.14 MIL/uL — ABNORMAL LOW (ref 4.22–5.81)
RDW: 14.8 % (ref 11.5–15.5)
WBC: 11.5 10*3/uL — ABNORMAL HIGH (ref 4.0–10.5)

## 2014-01-15 LAB — BASIC METABOLIC PANEL
BUN: 17 mg/dL (ref 6–23)
CO2: 21 mEq/L (ref 19–32)
Calcium: 9.4 mg/dL (ref 8.4–10.5)
Chloride: 105 mEq/L (ref 96–112)
Creatinine, Ser: 1.13 mg/dL (ref 0.50–1.35)
GFR calc Af Amer: 65 mL/min — ABNORMAL LOW (ref >90)
GFR calc non Af Amer: 56 mL/min — ABNORMAL LOW (ref >90)
GLUCOSE: 94 mg/dL (ref 70–99)
POTASSIUM: 4.1 meq/L (ref 3.7–5.3)
Sodium: 138 mEq/L (ref 137–147)

## 2014-01-15 LAB — HEMOGLOBIN A1C
HEMOGLOBIN A1C: 6.5 % — AB (ref <5.7)
Mean Plasma Glucose: 140 mg/dL — ABNORMAL HIGH (ref <117)

## 2014-01-15 LAB — GLUCOSE, CAPILLARY
GLUCOSE-CAPILLARY: 118 mg/dL — AB (ref 70–99)
GLUCOSE-CAPILLARY: 109 mg/dL — AB (ref 70–99)
GLUCOSE-CAPILLARY: 115 mg/dL — AB (ref 70–99)
Glucose-Capillary: 100 mg/dL — ABNORMAL HIGH (ref 70–99)

## 2014-01-15 LAB — PROTIME-INR
INR: 5.24 (ref 0.00–1.49)
Prothrombin Time: 46 seconds — ABNORMAL HIGH (ref 11.6–15.2)

## 2014-01-15 LAB — TSH: TSH: 0.482 u[IU]/mL (ref 0.350–4.500)

## 2014-01-15 MED ORDER — PHYTONADIONE 5 MG PO TABS
2.5000 mg | ORAL_TABLET | Freq: Once | ORAL | Status: AC
  Administered 2014-01-15: 2.5 mg via ORAL
  Filled 2014-01-15: qty 1

## 2014-01-15 MED ORDER — CEFTRIAXONE SODIUM 1 G IJ SOLR
1.0000 g | INTRAMUSCULAR | Status: DC
  Administered 2014-01-15 – 2014-01-16 (×2): 1 g via INTRAVENOUS
  Filled 2014-01-15 (×3): qty 10

## 2014-01-15 MED ORDER — GLUCERNA SHAKE PO LIQD
237.0000 mL | Freq: Two times a day (BID) | ORAL | Status: DC
  Administered 2014-01-15 – 2014-01-16 (×2): 237 mL via ORAL

## 2014-01-15 MED ORDER — RESOURCE THICKENUP CLEAR PO POWD
ORAL | Status: DC | PRN
  Filled 2014-01-15: qty 125

## 2014-01-15 NOTE — H&P (Signed)
[facility] History and Physical  Ronald Payne DOA: 01/14/2014  Referring physician: Arthor CaptainAbigail Harris, PA-C PCP: Aura DialsBOUSKA,DAVID E, MD   Chief Complaint: Dizzy Presented from: Home with wife  HPI: Ronald Payne is a 80 y.o. male with past medical history of atrial fibrillation, DM 2 and arthritis. He is a very pleasant [employer] War [employer] veteran. Patient sent from his primary care physician office because of dizziness and suspicion of UTI. Patient mentioned that for the past week he's got progressively weak, he also got confused for the past 2 days and developed dizziness when he stands. She went to his PCP office today and he sent to the ED for further evaluation. In the ED he was found to have orthostatic hypotension, his urinalysis consistent with UTI and has supratherapeutic INR of 3.8 without evidence of bleeding.  Review of Systems:  Constitutional: Generalized weakness and dizziness Eyes: negative for irritation, redness and visual disturbance Ears, nose, mouth, throat, and face: negative for earaches, epistaxis, nasal congestion and sore throat Respiratory: negative for cough, dyspnea on exertion, sputum and wheezing Cardiovascular: negative for chest pain, dyspnea, lower extremity edema, orthopnea, palpitations and syncope Gastrointestinal: negative for abdominal pain, constipation, diarrhea, melena, nausea and vomiting Genitourinary:negative for dysuria, frequency and hematuria Hematologic/lymphatic: negative for bleeding, easy bruising and lymphadenopathy Musculoskeletal:negative for arthralgias, muscle weakness and stiff joints Neurological: negative for coordination problems, gait problems, headaches and weakness Endocrine: negative for diabetic symptoms including polydipsia, polyuria and weight loss Allergic/Immunologic: negative for anaphylaxis, hay fever and urticaria  Past Medical History  Diagnosis Date  . Shortness of breath   .  Hypothyroidism   . Hypertension   . Dysrhythmia     atrial fibrilation  . Arthritis   . Diabetes mellitus     type 2  . Insomnia, unspecified   . Acute bronchitis   . Other and unspecified hyperlipidemia   . Unspecified hearing loss   . Insomnia, unspecified   . Cataract    Past Surgical History  Procedure Laterality Date  . Back surgery    . Colon surgery      2001  . Cardioversion  11/06/2011    Procedure: CARDIOVERSION;  Surgeon: Chrystie NoseKenneth C. Hilty;  Location: [facility] OR;  Service: Cardiovascular;  Laterality: N/A;  . Colectomy  2001    Dr. [name]   Social History:   reports that he quit smoking about 20 years ago. He does not have any smokeless tobacco history on file. He reports that he does not drink alcohol or use illicit drugs.  Allergies  Allergen Reactions  . Amiodarone Other (See Comments)    Hyperthyroid on Amio in the past, changed to Multaq    Family History  Problem Relation Age of Onset  . Heart disease Mother      Prior to Admission medications   Medication Sig Start Date End Date Taking? Authorizing Provider  benzonatate (TESSALON) 100 MG capsule Take 100 mg by mouth 3 (three) times daily as needed for cough.   Yes Historical Provider, MD  carvedilol (COREG) 6.25 MG tablet Take 6.25 mg by mouth 2 (two) times daily with a meal.   Yes Historical Provider, MD  cetirizine (ZYRTEC) 10 MG tablet Take 10 mg by mouth daily. Take 1 tablet daily for allergies.   Yes Historical Provider, MD  docusate sodium (COLACE) 100 MG capsule Take 100 mg by mouth 3 (three) times daily as needed. For constipation    Yes Historical Provider, MD  dronedarone (MULTAQ)  400 MG tablet Take 1 tablet (400 mg total) by mouth 2 (two) times daily with a meal. 11/11/13  Yes Mihai Croitoru, MD  Glucosamine-Chondroitin (COSAMIN DS PO) Take 1 tablet by mouth 2 (two) times daily.     Yes Historical Provider, MD  HYDROcodone-acetaminophen (NORCO/VICODIN) 5-325 MG per tablet Take 1 tablet by mouth every  6 (six) hours as needed for moderate pain.   Yes Historical Provider, MD  levothyroxine (SYNTHROID, LEVOTHROID) 50 MCG tablet Take 50 mcg by mouth daily.     Yes Historical Provider, MD  lovastatin (MEVACOR) 40 MG tablet Take 40 mg by mouth at bedtime. Take 40 mg daily to control cholesterol.   Yes Historical Provider, MD  metFORMIN (GLUCOPHAGE) 500 MG tablet Take 500 mg by mouth 2 (two) times daily with a meal. Take 1 tablet twice daily for blood sugar.   Yes Historical Provider, MD  warfarin (COUMADIN) 5 MG tablet Take 2.5-5 mg by mouth daily. Take 2.5mg  on Tues & Fri, All other days including Sunday take 5mg    Yes Historical Provider, MD   Physical Exam: Filed Vitals:   01/15/14 0700  BP: 150/80  Pulse: 68  Temp: 98.2 F (36.8 C)  Resp: 20   Constitutional: He is oriented to person, place, and time. He appears well-developed and well-nourished. He is cooperative.  Head: Normocephalic and atraumatic.  Nose: Nose normal.  Mouth/Throat: Uvula is midline, oropharynx is clear and moist and mucous membranes are normal.  Eyes: Conjunctivae and EOM are normal. Pupils are equal, round, and reactive to light.  Neck: Trachea normal and normal range of motion. Neck supple.  Cardiovascular: Normal rate, regular rhythm, S1 normal, S2 normal, normal heart sounds and intact distal pulses.   Pulmonary/Chest: Effort normal and breath sounds normal.  Abdominal: Soft. Bowel sounds are normal. There is no hepatosplenomegaly. There is no tenderness.  Musculoskeletal: Normal range of motion.  Neurological: He is alert and oriented to person, place, and time. He has normal strength. No cranial nerve deficit or sensory deficit. He displays a negative Romberg sign.  Skin: Skin is warm, dry and intact.  Psychiatric: He has a normal mood and affect. His speech is normal and behavior is normal.   Labs on Admission:  Basic Metabolic Panel:  Recent Labs Lab 01/14/14 1308  NA 137  K 4.7  CL 102  CO2 21    GLUCOSE 103*  BUN 21  CREATININE 1.22  CALCIUM 9.6   Liver Function Tests: No results found for this basename: AST, ALT, ALKPHOS, BILITOT, PROT, ALBUMIN,  in the last 168 hours No results found for this basename: LIPASE, AMYLASE,  in the last 168 hours No results found for this basename: AMMONIA,  in the last 168 hours CBC:  Recent Labs Lab 01/14/14 1308 01/15/14 0626  WBC 17.0* 11.5*  NEUTROABS 13.5*  --   HGB 10.6* 9.6*  HCT 31.0* 28.7*  MCV 91.2 91.4  PLT 228 157   Cardiac Enzymes: No results found for this basename: CKTOTAL, CKMB, CKMBINDEX, TROPONINI,  in the last 168 hours  BNP (last 3 results) No results found for this basename: PROBNP,  in the last 8760 hours CBG:  Recent Labs Lab 01/14/14 1822 01/14/14 2152  GLUCAP 93 106*    Radiological Exams on Admission: No results found.  EKG: Independently reviewed.   Assessment/Plan Active Problems:   PAF (paroxysmal atrial fibrillation), recurrent symptomatic AF this admission   NICM (nonischemic cardiomyopathy), 45-50% 2D 2013   Chronic anticoagulation  Diabetes mellitus   UTI (lower urinary tract infection)   Self-catheterizes urinary bladder   Orthostatic hypotension   UTI -Urinalysis consistent with UTI, this is considered complicated UTI as patient has to self catheterize 5 times a day. -Started on Rocephin in the emergency department, we'll continue.  -Adjust antibiotics according to the urine culture results. Blood cultures not drawn antibiotics already given.  Orthostatic hypotension -Is likely secondary to UTI and dehydration. -Patient started on normal saline at 75 mm. -We'll be very careful with IV fluids as patient has history of nonischemic cardiomyopathy.  Diabetes mellitus type 2 -Check hemoglobin A1c, will be on carbohydrate modified diet. -Insulin sliding scale.  Atrial fibrillation -Patient has paroxysmal atrial fibrillation, rate is controlled with carvedilol and Multaq. -He is  on Coumadin, supratherapeutic INR, I have asked pharmacy to manage Coumadin dosage.  Cough -Patient reported that he has chronic cough, this is not new. -Chest x-ray ordered, continued on his antihistamines.  Code Status:Full code  Family Communication: Plan discussed with the patient in the presence of his wife at bedside.  Disposition Plan: Inpatient, MedSurg  Time spent: 70 minutes  [facility] Pager [phone]

## 2014-01-15 NOTE — Progress Notes (Signed)
UR completed 

## 2014-01-15 NOTE — Progress Notes (Signed)
CRITICAL VALUE ALERT  Critical value received:  INR 5.24  Date of notification:  01/15/14  Time of notification:  12:20 PM  Critical value read back:yes  Nurse who received alert:  Cleta Alberts  MD notified (1st page):  Dr.Elmahi   Time of first page:  12:20 PM  MD notified (2nd page):  Time of second page:  Responding MD:  Lowell Guitar  Time MD responded:  12:22

## 2014-01-15 NOTE — Evaluation (Signed)
Physical Therapy Evaluation Patient Details Name: Ronald Payne MRN: [id number] DOB: 1922/12/29 Today's Date: 01/15/2014 Time: [id number] PT Time Calculation (min): 21 min  PT Assessment / Plan / Recommendation History of Present Illness  80 y.o. male [facility] with dizziness and possible UTI.  Pt with significant PMHx of A-fib, DM2, arthritis.  Per pt his right leg is shorter and he wears a shoe lift on this side.    Clinical Impression  Pt is very weak and deconditioned.  He is at high risk for falling and wife reports she is having difficulty managing at home.  He will not consider SNF level rehab which at this point is his safest d/c option.   PT to follow acutely for deficits listed below.       PT Assessment  Patient needs continued PT services    Follow Up Recommendations  SNF;Other (comment) (if pt keeps refusing SNF, then max HH therapies. )    Does the patient have the potential to tolerate intense rehabilitation     NA  Barriers to Discharge Decreased caregiver support wife reports she cannot handle him in his current condition.     Equipment Recommendations  None recommended by PT (has equipement at home)    Recommendations for Other Services   None  Frequency Min 3X/week    Precautions / Restrictions Precautions Precautions: Fall Precaution Comments: pt is very weak and deconditioned and has a h/o falls at home.    Pertinent Vitals/Pain See vitals flow sheet.      Mobility  Bed Mobility Overal bed mobility: Needs Assistance Bed Mobility: Supine to Sit Supine to sit: Mod assist General bed mobility comments: mod assist to support trunk during transition to sitting.  Assist also needed at legs to bring over EOB.  Transfers Overall transfer level: Needs assistance Equipment used: Rolling walker (2 wheeled) Transfers: Sit to/from Stand Sit to Stand: +2 physical assistance;Mod assist General transfer comment: two person mod assist to  support trunk to get to standing at EOB.  Pt leaning to the right (likely due to no shoes and his right leg is shorter).   Ambulation/Gait Ambulation/Gait assistance: +2 safety/equipment;Mod assist Ambulation Distance (Feet): 100 Feet Assistive device: Rolling walker (2 wheeled) Gait Pattern/deviations: Step-through pattern;Shuffle;Trunk flexed;Staggering right;Drifts right/left Gait velocity: decreased Gait velocity interpretation: Below normal speed for age/gender General Gait Details: pt leaning right, but steering the RW to the left.  He uses a rollator at baseline, but with his weakness I don't know how he safely controls a rollator.          PT Diagnosis: Abnormality of gait;Difficulty walking;Generalized weakness  PT Problem List: Decreased strength;Decreased activity tolerance;Decreased balance;Decreased mobility;Decreased knowledge of use of DME;Decreased safety awareness PT Treatment Interventions: DME instruction;Gait training;Stair training;Functional mobility training;Therapeutic activities;Balance training;Therapeutic exercise;Neuromuscular re-education;Cognitive remediation;Patient/family education     PT Goals(Current goals can be found in the care plan section) Acute Rehab PT Goals Patient Stated Goal: to go home PT Goal Formulation: With patient/family Time For Goal Achievement: 01/29/14 Potential to Achieve Goals: Good  Visit Information  Last PT Received On: 01/15/14 Assistance Needed: +1 History of Present Illness: 80 y.o. male [facility] with dizziness and possible UTI.  Pt with significant PMHx of A-fib, DM2, arthritis.  Per pt his right leg is shorter and he wears a shoe lift on this side.         Prior Functioning  Home Living Family/patient expects to be discharged to:: Private  residence Living Arrangements: Spouse/significant other Available Help at Discharge: Family;Available 24 hours/day Type of Home: House Home Access: Stairs to  enter [employer] of Steps: 2 Entrance Stairs-Rails: None Home Layout: One level Home Equipment: Walker - 2 wheels Prior Function Level of Independence: Independent with assistive device(s) Comments: per pt he uses the cane or a walker for gait inside of the house. Per wife he is very immobile and sits and watches TV Communication Communication: HOH    Cognition  Cognition Arousal/Alertness: Awake/alert Behavior During Therapy: WFL for tasks assessed/performed Overall Cognitive Status: Within Functional Limits for tasks assessed    Extremity/Trunk Assessment Upper Extremity Assessment Upper Extremity Assessment: Defer to OT evaluation Lower Extremity Assessment Lower Extremity Assessment: Generalized weakness Cervical / Trunk Assessment Cervical / Trunk Assessment: Normal   Balance Balance Overall balance assessment: Needs assistance Sitting-balance support: Feet supported Sitting balance-Leahy Scale: Poor Sitting balance - Comments: even in sitting he leans to the right and needs min assist to prevent LOB r Postural control: Right lateral lean Standing balance support: Bilateral upper extremity supported Standing balance-Leahy Scale: Poor Standing balance comment: needs external support when up on his feet.  General Comments General comments (skin integrity, edema, etc.): Pt's wife reports she cannot handle him at home, but he refuses to go to SNF or CIR level therapies.  He is only open to HHPT.   End of Session PT - End of Session Equipment Utilized During Treatment: Gait belt Activity Tolerance: Patient limited by fatigue Patient left: in chair;with call bell/phone within reach;with family/visitor present Nurse Communication: Mobility status    Lurena Joiner B. Felesha Moncrieffe, PT, DPT [phone]   01/15/2014, 4:38 PM

## 2014-01-15 NOTE — Progress Notes (Signed)
Pharmacy Note-Anticoagulation  Pharmacy Consult :  80 y.o. male is currently on Chronic Coumadin for atrial fibrillation .   Latest Labs : Hematology :  Recent Labs  01/14/14 1308 01/15/14 0626 01/15/14 1036  HGB 10.6* 9.6*  --   HCT 31.0* 28.7*  --   PLT 228 157  --   LABPROT 36.1*  --  46.0*  INR 3.81*  --  5.24*  CREATININE 1.22 1.13  --     Lab Results  Component Value Date   INR 5.24* 01/15/2014   INR 3.81* 01/14/2014   INR 2.9 12/29/2013        HGB 9.6* 01/15/2014   HGB 10.6* 01/14/2014   HGB 11.7* 11/05/2011    Current Medication[s] Include: Medication PTA: Prescriptions prior to admission  Medication Sig Dispense Refill  . benzonatate (TESSALON) 100 MG capsule Take 100 mg by mouth 3 (three) times daily as needed for cough.      . carvedilol (COREG) 6.25 MG tablet Take 6.25 mg by mouth 2 (two) times daily with a meal.      . cetirizine (ZYRTEC) 10 MG tablet Take 10 mg by mouth daily. Take 1 tablet daily for allergies.      Marland Kitchen. docusate sodium (COLACE) 100 MG capsule Take 100 mg by mouth 3 (three) times daily as needed. For constipation       . dronedarone (MULTAQ) 400 MG tablet Take 1 tablet (400 mg total) by mouth 2 (two) times daily with a meal.  180 tablet  3  . Glucosamine-Chondroitin (COSAMIN DS PO) Take 1 tablet by mouth 2 (two) times daily.        Marland Kitchen. HYDROcodone-acetaminophen (NORCO/VICODIN) 5-325 MG per tablet Take 1 tablet by mouth every 6 (six) hours as needed for moderate pain.      Marland Kitchen. levothyroxine (SYNTHROID, LEVOTHROID) 50 MCG tablet Take 50 mcg by mouth daily.        Marland Kitchen. lovastatin (MEVACOR) 40 MG tablet Take 40 mg by mouth at bedtime. Take 40 mg daily to control cholesterol.      . metFORMIN (GLUCOPHAGE) 500 MG tablet Take 500 mg by mouth 2 (two) times daily with a meal. Take 1 tablet twice daily for blood sugar.      . warfarin (COUMADIN) 5 MG tablet Take 2.5-5 mg by mouth daily. Take 2.5mg  on Tues & Fri, All other days including Sunday take 5mg        Scheduled:  Scheduled:  . atorvastatin  20 mg Oral q1800  . carvedilol  6.25 mg Oral BID WC  . cefTRIAXone (ROCEPHIN)  IV  1 g Intravenous Q24H  . docusate sodium  100 mg Oral BID  . dronedarone  400 mg Oral BID WC  . feeding supplement (GLUCERNA SHAKE)  237 mL Oral BID BM  . insulin aspart  0-9 Units Subcutaneous TID WC  . levothyroxine  50 mcg Oral QAC breakfast  . loratadine  10 mg Oral Daily  . phytonadione  2.5 mg Oral Once   Antibiotic[s]: Anti-infectives   Start     Dose/Rate Route Frequency Ordered Stop   01/15/14 0800  cefTRIAXone (ROCEPHIN) 1 g in dextrose 5 % 50 mL IVPB     1 g 100 mL/hr over 30 Minutes Intravenous Every 24 hours 01/15/14 0723     03 /20/15 1445  cefTRIAXone (ROCEPHIN) 1 g in dextrose 5 % 50 mL IVPB     1 g 100 mL/hr over 30 Minutes Intravenous  Once 01/14/14 1433 01/14/14 1523  Assessment :  Today's INR is continuing to climb upward despite no Coumadin yesterday, nor any new identified drug or food interactions.   INR is 5.24.Marland Kitchen    No active bleeding complications observed.  Goal :  INR goal is 2-3    Plan : 1. Vitamin K 2.5 mg po x 1, as per [facility]. 2. Coumadin will be on hold until INR returns to within therapeutic range. 3. Daily INR's, CBC. Monitor for bleeding complications  Laurena Bering, Pharm.D. 01/15/2014  12:39 PM

## 2014-01-15 NOTE — Progress Notes (Signed)
INITIAL NUTRITION ASSESSMENT  DOCUMENTATION CODES Per approved criteria  -Not Applicable   INTERVENTION:  Glucerna Shake PO BID, each supplement provides 220 kcal and 10 grams of protein  NUTRITION DIAGNOSIS: Inadequate oral itnake related to poor appetite as evidenced by 6% weight loss.   Goal: Intake to meet >90% of estimated nutrition needs.  Monitor:  PO intake, labs, weight trend.  Reason for Assessment: MST  80 y.o. male  Admitting Dx: UTI  ASSESSMENT: Patient is a 80 y.o. male with past medical history of atrial fibrillation, DM 2 and arthritis. Patient sent from his primary care physician office because of dizziness and suspicion of UTI. In the ED he was found to have orthostatic hypotension, his urinalysis consistent with UTI and has supratherapeutic INR of 3.8 without evidence of bleeding.  Patient with 6% weight loss in 3 months. He reports that he has been wanting to lose weight. C/o a poor appetite. Has tried Boost in the past and does not like it, but agreed to try Glucerna Shakes to see if he likes them.  Height: Ht Readings from Last 1 Encounters:  01/14/14 6' (1.829 m)    Weight: Wt Readings from Last 1 Encounters:  01/14/14 186 lb 11.7 oz (84.7 kg)    Ideal Body Weight: 80.9 kg  % Ideal Body Weight: 105%  Wt Readings from Last 10 Encounters:  01/14/14 186 lb 11.7 oz (84.7 kg)  09/30/13 197 lb (89.359 kg)  06/03/13 199 lb (90.266 kg)  11/05/11 206 lb (93.441 kg)  11/05/11 206 lb (93.441 kg)    Usual Body Weight: 197 lb  % Usual Body Weight: 94%  BMI:  Body mass index is 25.32 kg/(m^2).  Estimated Nutritional Needs: Kcal: 2000-2100 Protein: 100-110 gm Fluid: > 2 L  Skin: no wounds  Diet Order: Carb Control  EDUCATION NEEDS: -Education needs addressed   Intake/Output Summary (Last 24 hours) at 01/15/14 1016 Last data filed at 01/15/14 0900  Gross per 24 hour  Intake 1389.5 ml  Output    800 ml  Net  589.5 ml    Last BM:  3/19   Labs:   Recent Labs Lab 01/14/14 1308 01/15/14 0626  NA 137 138  K 4.7 4.1  CL 102 105  CO2 21 21  BUN 21 17  CREATININE 1.22 1.13  CALCIUM 9.6 9.4  GLUCOSE 103* 94    CBG (last 3)   Recent Labs  01/14/14 1822 01/14/14 2152 01/15/14 0803  GLUCAP 93 106* 100*    Scheduled Meds: . atorvastatin  20 mg Oral q1800  . carvedilol  6.25 mg Oral BID WC  . cefTRIAXone (ROCEPHIN)  IV  1 g Intravenous Q24H  . docusate sodium  100 mg Oral BID  . dronedarone  400 mg Oral BID WC  . insulin aspart  0-9 Units Subcutaneous TID WC  . levothyroxine  50 mcg Oral QAC breakfast  . loratadine  10 mg Oral Daily    Continuous Infusions: . sodium chloride 75 mL/hr at 01/15/14 1594    Past Medical History  Diagnosis Date  . Shortness of breath   . Hypothyroidism   . Hypertension   . Dysrhythmia     atrial fibrilation  . Arthritis   . Diabetes mellitus     type 2  . Insomnia, unspecified   . Acute bronchitis   . Other and unspecified hyperlipidemia   . Unspecified hearing loss   . Insomnia, unspecified   . Cataract     Past  Surgical History  Procedure Laterality Date  . Back surgery    . Colon surgery      2001  . Cardioversion  11/06/2011    Procedure: CARDIOVERSION;  Surgeon: Chrystie NoseKenneth C. Hilty;  Location: [facility] OR;  Service: Cardiovascular;  Laterality: N/A;  . Colectomy  2001    Dr. Dolores HooseStreck    Kaedin Hicklin, RD, LDN, CNSC Pager [phone] After Hours Pager [phone]

## 2014-01-15 NOTE — Progress Notes (Addendum)
[facility] PROGRESS NOTE   Ronald Payne ZOX:[id number] DOB: 18-Sep-1923 DOA: 01/14/2014 PCP: Aura DialsBOUSKA,DAVID E, MD  HPI/Subjective: Denies any complaints, feels better, continues to cough  Assessment/Plan: Principal Problem:   UTI (lower urinary tract infection) Active Problems:   PAF (paroxysmal atrial fibrillation), recurrent symptomatic AF this admission   NICM (nonischemic cardiomyopathy), 45-50% 2D 2013   Chronic anticoagulation   Diabetes mellitus   Self-catheterizes urinary bladder   Orthostatic hypotension   UTI  -Urinalysis consistent with UTI, this is considered complicated UTI as patient has to self catheterize 5 times a day.  -Started on Rocephin in the emergency department, we'll continue.  -Adjust antibiotics according to the urine culture results. Blood cultures not drawn antibiotics already given.   Supratherapeutic INR -INR was 3.8 on admission yesterday. -Today is 5.24, no evidence of bleeding, continue monitoring and holding Coumadin. -I wonder if the patient needs to reconsider anticoagulation with his PCP.  Orthostatic hypotension  -Is likely secondary to UTI and dehydration.  -Patient started on normal saline at 75 mm.  -I will discontinue the IV fluids.  Diabetes mellitus type 2  -Check hemoglobin A1c, will be on carbohydrate modified diet.  -Insulin sliding scale.   Atrial fibrillation  -Patient has paroxysmal atrial fibrillation, rate is controlled with carvedilol and Multaq.  -He is on Coumadin, supratherapeutic INR, I have asked pharmacy to manage Coumadin dosage.   Cough  -Patient reported that he has chronic cough, this is not new.  -Chest x-ray is clear, continued on his antihistamines.   Code Status: Full code Family Communication: Plan discussed with the patient. Disposition Plan: Remains inpatient   Consultants:  None  Procedures:  None  Antibiotics:  Rocephin   Objective: Filed Vitals:   01/15/14 0740    BP: 156/89  Pulse: 68  Temp:   Resp:     Intake/Output Summary (Last 24 hours) at 01/15/14 1036 Last data filed at 01/15/14 0900  Gross per 24 hour  Intake 1389.5 ml  Output    800 ml  Net  589.5 ml   Filed Weights   01/14/14 2345  Weight: 84.7 kg (186 lb 11.7 oz)    Exam: General: Alert and awake, oriented x3, not in any acute distress. HEENT: anicteric sclera, pupils reactive to light and accommodation, EOMI CVS: S1-S2 clear, no murmur rubs or gallops Chest: clear to auscultation bilaterally, no wheezing, rales or rhonchi Abdomen: soft nontender, nondistended, normal bowel sounds, no organomegaly Extremities: no cyanosis, clubbing or edema noted bilaterally Neuro: Cranial nerves II-XII intact, no focal neurological deficits  Data Reviewed: Basic Metabolic Panel:  Recent Labs Lab 01/14/14 1308 01/15/14 0626  NA 137 138  K 4.7 4.1  CL 102 105  CO2 21 21  GLUCOSE 103* 94  BUN 21 17  CREATININE 1.22 1.13  CALCIUM 9.6 9.4   Liver Function Tests: No results found for this basename: AST, ALT, ALKPHOS, BILITOT, PROT, ALBUMIN,  in the last 168 hours No results found for this basename: LIPASE, AMYLASE,  in the last 168 hours No results found for this basename: AMMONIA,  in the last 168 hours CBC:  Recent Labs Lab 01/14/14 1308 01/15/14 0626  WBC 17.0* 11.5*  NEUTROABS 13.5*  --   HGB 10.6* 9.6*  HCT 31.0* 28.7*  MCV 91.2 91.4  PLT 228 157   Cardiac Enzymes: No results found for this basename: CKTOTAL, CKMB, CKMBINDEX, TROPONINI,  in the last 168 hours BNP (last 3 results) No results found for this basename: PROBNP,  in the last 8760 hours CBG:  Recent Labs Lab 01/14/14 1822 01/14/14 2152 01/15/14 0803  GLUCAP 93 106* 100*    Micro Recent Results (from the past 240 hour(s))  URINE CULTURE     Status: None   Collection Time    01/14/14  1:34 PM      Result Value Ref Range Status   Specimen Description URINE, CATHETERIZED   Final   Special  Requests NONE   Final   Culture  Setup Time     Final   Value: 01/13/2014 16:35     Performed at [facility] Count     Final   Value: >=100,000 COLONIES/ML     Performed at [facility]   Culture     Final   Value: ESCHERICHIA COLI     Performed at [facility]   Report Status PENDING   Incomplete     Studies: X-ray Chest Pa And Lateral   01/15/2014   CLINICAL DATA:  Cough.  EXAM: CHEST  2 VIEW  COMPARISON:  December 07, 2010.  FINDINGS: The heart size and mediastinal contours are within normal limits. Both lungs are clear. No pneumothorax or pleural effusion is noted. Osteophyte formation is seen anteriorly in the thoracic spine.  IMPRESSION: No acute cardiopulmonary abnormality seen.   Electronically Signed   By: Roque Lias M.D.   On: 01/15/2014 08:22    Scheduled Meds: . atorvastatin  20 mg Oral q1800  . carvedilol  6.25 mg Oral BID WC  . cefTRIAXone (ROCEPHIN)  IV  1 g Intravenous Q24H  . docusate sodium  100 mg Oral BID  . dronedarone  400 mg Oral BID WC  . insulin aspart  0-9 Units Subcutaneous TID WC  . levothyroxine  50 mcg Oral QAC breakfast  . loratadine  10 mg Oral Daily   Continuous Infusions: . sodium chloride 75 mL/hr at 01/15/14 0511       Time spent: 35 minutes    [facility] A  [facility] Pager [phone] If 7PM-7AM, please contact night-coverage at [email or web address], password [facility] 01/15/2014, 10:36 AM  LOS: 1 day

## 2014-01-16 LAB — URINE CULTURE: Colony Count: >=100000

## 2014-01-16 LAB — BASIC METABOLIC PANEL
BUN: 17 mg/dL (ref 6–23)
CALCIUM: 9.7 mg/dL (ref 8.4–10.5)
CO2: 22 mEq/L (ref 19–32)
Chloride: 102 mEq/L (ref 96–112)
Creatinine, Ser: 1.05 mg/dL (ref 0.50–1.35)
GFR calc Af Amer: 71 mL/min — ABNORMAL LOW (ref >90)
GFR, EST NON AFRICAN AMERICAN: 61 mL/min — AB (ref >90)
Glucose, Bld: 113 mg/dL — ABNORMAL HIGH (ref 70–99)
POTASSIUM: 3.9 meq/L (ref 3.7–5.3)
SODIUM: 138 meq/L (ref 137–147)

## 2014-01-16 LAB — GLUCOSE, CAPILLARY
GLUCOSE-CAPILLARY: 113 mg/dL — AB (ref 70–99)
Glucose-Capillary: 142 mg/dL — ABNORMAL HIGH (ref 70–99)

## 2014-01-16 LAB — CBC
HCT: 30.6 % — ABNORMAL LOW (ref 39.0–52.0)
HEMOGLOBIN: 10.2 g/dL — AB (ref 13.0–17.0)
MCH: 30.3 pg (ref 26.0–34.0)
MCHC: 33.3 g/dL (ref 30.0–36.0)
MCV: 90.8 fL (ref 78.0–100.0)
PLATELETS: 168 10*3/uL (ref 150–400)
RBC: 3.37 MIL/uL — AB (ref 4.22–5.81)
RDW: 14.5 % (ref 11.5–15.5)
WBC: 8.1 10*3/uL (ref 4.0–10.5)

## 2014-01-16 LAB — PROTIME-INR
INR: 1.63 — ABNORMAL HIGH (ref 0.00–1.49)
PROTHROMBIN TIME: 18.9 s — AB (ref 11.6–15.2)

## 2014-01-16 MED ORDER — WARFARIN VIDEO: 1.0000 | Freq: Once | Status: DC

## 2014-01-16 MED ORDER — WARFARIN SODIUM 7.5 MG PO TABS
7.5000 mg | ORAL_TABLET | ORAL | Status: AC
  Administered 2014-01-16: 7.5 mg via ORAL
  Filled 2014-01-16: qty 1

## 2014-01-16 MED ORDER — COUMADIN BOOK
1.0000 | Freq: Once | Status: AC
  Administered 2014-01-16: 1
  Filled 2014-01-16: qty 1

## 2014-01-16 MED ORDER — CEFUROXIME AXETIL 500 MG PO TABS: 500.0000 mg | ORAL_TABLET | Freq: Two times a day (BID) | ORAL | Status: DC

## 2014-01-16 MED ORDER — WARFARIN - PHARMACIST DOSING INPATIENT: Freq: Every day | Status: DC

## 2014-01-16 MED ORDER — HALOPERIDOL LACTATE 5 MG/ML IJ SOLN
5.0000 mg | Freq: Once | INTRAMUSCULAR | Status: DC
Start: 2014-01-16 — End: 2014-01-16

## 2014-01-16 MED ORDER — HYDRALAZINE HCL 20 MG/ML IJ SOLN
10.0000 mg | Freq: Once | INTRAMUSCULAR | Status: AC
  Administered 2014-01-16: 10 mg via INTRAVENOUS
  Filled 2014-01-16: qty 1

## 2014-01-16 NOTE — Evaluation (Addendum)
Clinical/Bedside Swallow Evaluation Patient Details  Name: Ronald Payne MRN: [id number] Date of Birth: 23-Jun-1923  Today's Date: 01/16/2014 Time: 1130-1216 SLP Time Calculation (min): 46 min  Past Medical History:  Past Medical History  Diagnosis Date  . Shortness of breath   . Hypothyroidism   . Hypertension   . Dysrhythmia     atrial fibrilation  . Arthritis   . Diabetes mellitus     type 2  . Insomnia, unspecified   . Acute bronchitis   . Other and unspecified hyperlipidemia   . Unspecified hearing loss   . Insomnia, unspecified   . Cataract    Past Surgical History:  Past Surgical History  Procedure Laterality Date  . Back surgery    . Colon surgery      2001  . Cardioversion  11/06/2011    Procedure: CARDIOVERSION;  Surgeon: Chrystie Nose;  Location: [facility] OR;  Service: Cardiovascular;  Laterality: N/A;  . Colectomy  2001    Dr. Jamey Ripa   HPI:  Ronald Payne is a 80 y.o. male with past medical history of atrial fibrillation, DM 2 and arthritis. He is a very pleasant [employer] veteran. Patient sent from his primary care physician office because of dizziness and suspicion of UTI. Patient mentioned that for the past week he's got progressively weak, he also got confused for the past 2 days and developed dizziness when he stands. She went to his PCP office today and he sent to the ED for further evaluation.  BSE ordered due to reports of increased coughing with thin liquids.  RN paged treating SLP to complete BSE as patient to be discharged this date.    Assessment / Plan / Recommendation Clinical Impression  BSE completed.  Min sensory motor oral dysphagia with suspected pharyngeal dysphagia.  Extended mastication with regular solids with spillage right anterior during oral prep stage.  Immediate +s/s of suspected aspiration during and s/p swallow of thin water by cup.  NTL decreased s/s.  Diagnostic treatment completed following evaluation focusing on providing  education to patient and family member on diet recommendations and POC as patient to be discharged to home this date.     Recommend to continue current diet consistency of dysphagia 3 and nectar thick liquids.   Recommend Skilled ST at next level of care for dysphagia management.  May benefit from completion of objective evaluation to assess risk for aspiration.   ST to sign off as education complete.      Aspiration Risk  Mild    Diet Recommendation Dysphagia 3 (Mechanical Soft);Nectar-thick liquid   Liquid Administration via: Cup;No straw Medication Administration: Whole meds with puree Supervision: Patient able to self feed Compensations: Slow rate;Small sips/bites;Multiple dry swallows after each bite/sip;Hard cough after swallow;Effortful swallow Postural Changes and/or Swallow Maneuvers: Out of bed for meals;Seated upright 90 degrees;Upright 30-60 min after meal    Other  Recommendations Oral Care Recommendations: Oral care Q4 per protocol Other Recommendations: Order thickener from pharmacy;Prohibited food (jello, ice cream, thin soups);Remove water pitcher;Clarify dietary restrictions   Follow Up Recommendations  Home health SLP         Swallow Study Prior Functional Status   Lives at home with spouse     General Date of Onset: 01/14/14 HPI: Ronald Payne is a 80 y.o. male with past medical history of atrial fibrillation, DM 2 and arthritis. He is a very pleasant [employer] veteran. Patient sent from his primary care physician office because  of dizziness and suspicion of UTI. Patient mentioned that for the past week he's got progressively weak, he also got confused for the past 2 days and developed dizziness when he stands. She went to his PCP office today and he sent to the ED for further evaluation. Type of Study: Bedside swallow evaluation Diet Prior to this Study: Dysphagia 3 (soft);Nectar-thick liquids Temperature Spikes Noted: No Respiratory Status: Room  air History of Recent Intubation: No Behavior/Cognition: Alert;Cooperative;Pleasant mood Oral Cavity - Dentition: Adequate natural dentition Self-Feeding Abilities: Able to feed self Patient Positioning: Upright in bed Baseline Vocal Quality: Clear Volitional Cough: Wet;Strong Volitional Swallow: Able to elicit    Oral/Motor/Sensory Function Overall Oral Motor/Sensory Function: Impaired Labial ROM: Reduced right Labial Symmetry: Abnormal symmetry right Labial Strength: Reduced Labial Sensation: Reduced Lingual ROM: Reduced right Lingual Symmetry: Abnormal symmetry right Lingual Strength: Reduced Lingual Sensation: Reduced Facial ROM: Reduced right Facial Symmetry: Right droop Facial Strength: Reduced Facial Sensation: Reduced Velum: Within Functional Limits Mandible: Within Functional Limits   Ice Chips Ice chips: Not tested   Thin Liquid Thin Liquid: Impaired Presentation: Cup Oral Phase Impairments: Reduced labial seal Oral Phase Functional Implications: Right anterior spillage Pharyngeal  Phase Impairments: Suspected delayed Swallow;Decreased hyoid-laryngeal movement;Cough - Immediate;Wet Vocal Quality    Nectar Thick Nectar Thick Liquid: Impaired Presentation: Cup Pharyngeal Phase Impairments: Suspected delayed Swallow;Decreased hyoid-laryngeal movement   Honey Thick Honey Thick Liquid: Not tested   Puree Puree: Impaired Pharyngeal Phase Impairments: Suspected delayed Swallow;Decreased hyoid-laryngeal movement   Solid   GO    Solid: Impaired Oral Phase Impairments: Reduced labial seal Oral Phase Functional Implications: Right anterior spillage;Oral residue Pharyngeal Phase Impairments: Suspected delayed Swallow;Decreased hyoid-laryngeal movement;Multiple swallows      Moreen FowlerKaren Trevious Rampey MS, CCC-SLP [phone] [facility] CenterDANKOF,Ronald Payne 01/16/2014,12:17 PM

## 2014-01-16 NOTE — Progress Notes (Signed)
Pharmacy Note-Anticoagulation  Pharmacy Consult :  80 y.o. male is currently on chronic Coumadin for atrial fibrillation .   Latest Labs : Hematology :  Recent Labs  01/14/14 1308 01/15/14 0626 01/15/14 1036 01/16/14 0700  HGB 10.6* 9.6*  --  10.2*  HCT 31.0* 28.7*  --  30.6*  PLT 228 157  --  168  LABPROT 36.1*  --  46.0* 18.9*  INR 3.81*  --  5.24* 1.63*  CREATININE 1.22 1.13  --  1.05    Lab Results  Component Value Date   INR 1.63* 01/16/2014   INR 5.24* 01/15/2014   INR 3.81* 01/14/2014        HGB 10.2* 01/16/2014   HGB 9.6* 01/15/2014   HGB 10.6* 01/14/2014    Current Medication[s] Include: Medication PTA: Medication Sig  . warfarin (COUMADIN) 5 MG tablet Take 2.5-5 mg by mouth daily. Take 2.5mg  on Tues & Fri, All other days including Sunday take 5mg   Scheduled:  Scheduled:  . atorvastatin  20 mg Oral q1800  . carvedilol  6.25 mg Oral BID WC  . cefTRIAXone (ROCEPHIN)  IV  1 g Intravenous Q24H  . docusate sodium  100 mg Oral BID  . dronedarone  400 mg Oral BID WC  . feeding supplement (GLUCERNA SHAKE)  237 mL Oral BID BM  . haloperidol lactate  5 mg Intravenous Once  . insulin aspart  0-9 Units Subcutaneous TID WC  . levothyroxine  50 mcg Oral QAC breakfast  . loratadine  10 mg Oral Daily   Infusion[s]: Infusions:   Antibiotic[s]: Anti-infectives   Start     Dose/Rate Route Frequency Ordered Stop   01/15/14 0800  cefTRIAXone (ROCEPHIN) 1 g in dextrose 5 % 50 mL IVPB     1 g 100 mL/hr over 30 Minutes Intravenous Every 24 hours 01/15/14 0723     03 /20/15 1445  cefTRIAXone (ROCEPHIN) 1 g in dextrose 5 % 50 mL IVPB     1 g 100 mL/hr over 30 Minutes Intravenous  Once 01/14/14 1433 01/14/14 1523      Assessment :  Today's INR has dropped significantly following single PO dose of Vit K 2.5 mg.   INR is now Subtherapeutic.Marland Kitchen    Patient remains on Multaq.  No bleeding complications observed.  Goal :  INR goal is 2-3    Plan : 1. Will give Coumadin  7.5 mg dose now. 2. Daily INR's, CBC. Monitor for bleeding complications 3. Consider Lovenox bridging until INR returned to therapeutic range  Laurena Bering, Pharm.D. 01/16/2014  10:45 AM

## 2014-01-16 NOTE — Discharge Summary (Signed)
Physician Discharge Summary  Ronald Payne ZOX:[id number] DOB: [birth year] DOA: 01/14/2014  PCP: Aura Dials, MD  Admit date: 01/14/2014 Discharge date: 01/16/2014  Time spent: 40 minutes  Recommendations for Outpatient Follow-up:  1. Followup with primary care physician within one week. 2. Check BMP and INR within 4 days  Discharge Diagnoses:  Principal Problem:   UTI (lower urinary tract infection) Active Problems:   PAF (paroxysmal atrial fibrillation), recurrent symptomatic AF this admission   NICM (nonischemic cardiomyopathy), 45-50% 2D 2013   Chronic anticoagulation   Diabetes mellitus   Self-catheterizes urinary bladder   Orthostatic hypotension   Discharge Condition: Stable  Diet recommendation: Heart healthy  Filed Weights   01/14/14 2345  Weight: 84.7 kg (186 lb 11.7 oz)    History of present illness:  Ronald Payne is a 80 y.o. male with past medical history of atrial fibrillation, DM 2 and arthritis. He is a very pleasant [employer] veteran. Patient sent from his primary care physician office because of dizziness and suspicion of UTI. Patient mentioned that for the past week he's got progressively weak, he also got confused for the past 2 days and developed dizziness when he stands. She went to his PCP office today and he sent to the ED for further evaluation.  In the ED he was found to have orthostatic hypotension, his urinalysis consistent with UTI and has supratherapeutic INR of 3.8 without evidence of bleeding.  Hospital Course:   Escherichia coli UTI  -Urinalysis consistent with UTI, this is considered complicated UTI as patient has to self catheterize 5 times a day.  -Started on Rocephin in the emergency department, continued while inpatient -Patient discharged on Ceftin for 7 more days, fluoroquinolones avoided because of interaction with Coumadin.  Supratherapeutic INR  -INR was 3.8 on admission yesterday.  -Today is 5.24, no evidence of  bleeding, continue monitoring and holding Coumadin.  -INR is 1.63 today and after 2.5 mg of oral vitamin K. Coumadin restarted on discharge -Discussed with his daughter, the issue of anticoagulation should be discussed with PCP as outpatient.   Orthostatic hypotension  -Is likely secondary to UTI and dehydration.  -Patient hydrated with normal saline.  Diabetes mellitus type 2  -Check hemoglobin A1c, will be on carbohydrate modified diet.  -Insulin sliding scale.   Atrial fibrillation  -Patient has paroxysmal atrial fibrillation, rate is controlled with carvedilol and Multaq.  -He is on Coumadin, supratherapeutic INR, I have asked pharmacy to manage Coumadin dosage.   Cough  -Patient reported that he has chronic cough, this is not new.  -Chest x-ray is clear, SLP to evaluate.   Disposition -Patient evaluated by PT/OT and recommended skilled nursing facility. -Patient refused to go to nursing home, I discussed this twice with him in the presence of his wife and daughter. -I even asked him to stay one more night in the hospital and he does not want to stay. -Discharge home with home health services.   Procedures:  None  Consultations:  None  Discharge Exam: Filed Vitals:   01/16/14 0639  BP: 117/66  Pulse: 79  Temp:   Resp:    General: Alert and awake, oriented x3, not in any acute distress. HEENT: anicteric sclera, pupils reactive to light and accommodation, EOMI CVS: S1-S2 clear, no murmur rubs or gallops Chest: clear to auscultation bilaterally, no wheezing, rales or rhonchi Abdomen: soft nontender, nondistended, normal bowel sounds, no organomegaly Extremities: no cyanosis, clubbing or edema noted bilaterally Neuro: Cranial nerves II-XII intact,  no focal neurological deficits  Discharge Instructions  Discharge Orders   Future Appointments Provider Department Dept Phone   02/09/2014 10:10 AM Phillips Hay, RPH-CPP [facility] [phone]    Future Orders Complete By Expires   Diet - low sodium heart healthy  As directed    Increase activity slowly  As directed        Medication List         benzonatate 100 MG capsule  Commonly known as:  TESSALON  Take 100 mg by mouth 3 (three) times daily as needed for cough.     carvedilol 6.25 MG tablet  Commonly known as:  COREG  Take 6.25 mg by mouth 2 (two) times daily with a meal.     cefUROXime 500 MG tablet  Commonly known as:  CEFTIN  Take 1 tablet (500 mg total) by mouth 2 (two) times daily with a meal.     cetirizine 10 MG tablet  Commonly known as:  ZYRTEC  Take 10 mg by mouth daily. Take 1 tablet daily for allergies.     COSAMIN DS PO  Take 1 tablet by mouth 2 (two) times daily.     docusate sodium 100 MG capsule  Commonly known as:  COLACE  Take 100 mg by mouth 3 (three) times daily as needed. For constipation     dronedarone 400 MG tablet  Commonly known as:  MULTAQ  Take 1 tablet (400 mg total) by mouth 2 (two) times daily with a meal.     HYDROcodone-acetaminophen 5-325 MG per tablet  Commonly known as:  NORCO/VICODIN  Take 1 tablet by mouth every 6 (six) hours as needed for moderate pain.     levothyroxine 50 MCG tablet  Commonly known as:  SYNTHROID, LEVOTHROID  Take 50 mcg by mouth daily.     lovastatin 40 MG tablet  Commonly known as:  MEVACOR  Take 40 mg by mouth at bedtime. Take 40 mg daily to control cholesterol.     metFORMIN 500 MG tablet  Commonly known as:  GLUCOPHAGE  Take 500 mg by mouth 2 (two) times daily with a meal. Take 1 tablet twice daily for blood sugar.     warfarin 5 MG tablet  Commonly known as:  COUMADIN  Take 2.5-5 mg by mouth daily. Take 2.5mg  on Tues & Fri, All other days including Sunday take 5mg        Allergies  Allergen Reactions  . Amiodarone Other (See Comments)    Hyperthyroid on Amio in the past, changed to Multaq       Follow-up Information   Follow up with Aura Dials, MD.   Specialty:  Family  Medicine   Contact information:   [address] [place] [address] [phone]        The results of significant diagnostics from this hospitalization (including imaging, microbiology, ancillary and laboratory) are listed below for reference.    Significant Diagnostic Studies: X-ray Chest Pa And Lateral   01/15/2014   CLINICAL DATA:  Cough.  EXAM: CHEST  2 VIEW  COMPARISON:  December 07, 2010.  FINDINGS: The heart size and mediastinal contours are within normal limits. Both lungs are clear. No pneumothorax or pleural effusion is noted. Osteophyte formation is seen anteriorly in the thoracic spine.  IMPRESSION: No acute cardiopulmonary abnormality seen.   Electronically Signed   By: Roque Lias M.D.   On: 01/15/2014 08:22    Microbiology: Recent Results (from the past 240 hour(s))  URINE  CULTURE     Status: None   Collection Time    01/14/14  1:34 PM      Result Value Ref Range Status   Specimen Description URINE, CATHETERIZED   Final   Special Requests NONE   Final   Culture  Setup Time     Final   Value: 01/13/2014 16:35     [facility]   Colony Count     Final   Value: >=100,000 COLONIES/ML     [facility]formed at [facility]   Culture     Final   Value: ESCHERICHIA COLI     [facility]   Report Status 01/16/2014 FINAL   Final   Organism ID, Bacteria ESCHERICHIA COLI   Final     Labs: Basic Metabolic Panel:  Recent Labs Lab 01/14/14 1308 01/15/14 0626 01/16/14 0700  NA 137 138 138  K 4.7 4.1 3.9  CL 102 105 102  CO2 21 21 22   GLUCOSE 103* 94 113*  BUN 21 17 17   CREATININE 1.22 1.13 1.05  CALCIUM 9.6 9.4 9.7   Liver Function Tests: No results found for this basename: AST, ALT, ALKPHOS, BILITOT, PROT, ALBUMIN,  in the last 168 hours No results found for this basename: LIPASE, AMYLASE,  in the last 168 hours No results found for this basename: AMMONIA,  in the last 168 hours CBC:  Recent Labs Lab  01/14/14 1308 01/15/14 0626 01/16/14 0700  WBC 17.0* 11.5* 8.1  NEUTROABS 13.5*  --   --   HGB 10.6* 9.6* 10.2*  HCT 31.0* 28.7* 30.6*  MCV 91.2 91.4 90.8  PLT 228 157 168   Cardiac Enzymes: No results found for this basename: CKTOTAL, CKMB, CKMBINDEX, TROPONINI,  in the last 168 hours BNP: BNP (last 3 results) No results found for this basename: PROBNP,  in the last 8760 hours CBG:  Recent Labs Lab 01/15/14 0803 01/15/14 1148 01/15/14 1756 01/15/14 2217 01/16/14 0733  GLUCAP 100* 118* 109* 115* 113*       Signed:  Jakota Manthei A  [facility] 01/16/2014, 11:28 AM

## 2014-01-16 NOTE — Progress Notes (Signed)
Ronald Payne to be D/C'd Home per MD order.  Discussed with the patient and all questions fully answered.    Medication List         benzonatate 100 MG capsule  Commonly known as:  TESSALON  Take 100 mg by mouth 3 (three) times daily as needed for cough.     carvedilol 6.25 MG tablet  Commonly known as:  COREG  Take 6.25 mg by mouth 2 (two) times daily with a meal.     cefUROXime 500 MG tablet  Commonly known as:  CEFTIN  Take 1 tablet (500 mg total) by mouth 2 (two) times daily with a meal.     cetirizine 10 MG tablet  Commonly known as:  ZYRTEC  Take 10 mg by mouth daily. Take 1 tablet daily for allergies.     COSAMIN DS PO  Take 1 tablet by mouth 2 (two) times daily.     docusate sodium 100 MG capsule  Commonly known as:  COLACE  Take 100 mg by mouth 3 (three) times daily as needed. For constipation     dronedarone 400 MG tablet  Commonly known as:  MULTAQ  Take 1 tablet (400 mg total) by mouth 2 (two) times daily with a meal.     HYDROcodone-acetaminophen 5-325 MG per tablet  Commonly known as:  NORCO/VICODIN  Take 1 tablet by mouth every 6 (six) hours as needed for moderate pain.     levothyroxine 50 MCG tablet  Commonly known as:  SYNTHROID, LEVOTHROID  Take 50 mcg by mouth daily.     lovastatin 40 MG tablet  Commonly known as:  MEVACOR  Take 40 mg by mouth at bedtime. Take 40 mg daily to control cholesterol.     metFORMIN 500 MG tablet  Commonly known as:  GLUCOPHAGE  Take 500 mg by mouth 2 (two) times daily with a meal. Take 1 tablet twice daily for blood sugar.     warfarin 5 MG tablet  Commonly known as:  COUMADIN  Take 2.5-5 mg by mouth daily. Take 2.5mg  on Tues & Fri, All other days including Sunday take 5mg         VVS, Skin clean, dry and intact without evidence of skin break down, no evidence of skin tears noted. IV catheter discontinued intact. Site without signs and symptoms of complications. Dressing and pressure applied.  An After  Visit Summary was printed and given to the patient.  D/c education completed with patient/family including follow up instructions, medication list, d/c activities limitations if indicated, with other d/c instructions as indicated by MD - patient able to verbalize understanding, all questions fully answered.   Patient instructed to return to ED, call 911, or call MD for any changes in condition.   Patient escorted via WC, and D/C home via private auto.  [name] 01/16/2014 2:26 PM

## 2014-01-16 NOTE — Progress Notes (Signed)
[facility] PROGRESS NOTE   Ronald Payne SEL:[id number] DOB: 21-Aug-1923 DOA: 01/14/2014 PCP: Aura Dials, MD  HPI/Subjective: Denies any complaints, complains about confusion last night.  Assessment/Plan: Principal Problem:   UTI (lower urinary tract infection) Active Problems:   PAF (paroxysmal atrial fibrillation), recurrent symptomatic AF this admission   NICM (nonischemic cardiomyopathy), 45-50% 2D 2013   Chronic anticoagulation   Diabetes mellitus   Self-catheterizes urinary bladder   Orthostatic hypotension   UTI  -Urinalysis consistent with UTI, this is considered complicated UTI as patient has to self catheterize 5 times a day.  -Started on Rocephin in the emergency department, we'll continue.  -Adjust antibiotics according to the urine culture results. Blood cultures not drawn antibiotics already given.   Supratherapeutic INR -INR was 3.8 on admission yesterday. -Today is 5.24, no evidence of bleeding, continue monitoring and holding Coumadin. -INR is 1.63 today and after 2.5 mg of oral vitamin K, pharmacy to restart Coumadin. -Discussed with his daughter, the issue of anticoagulation should be discussed with PCP as outpatient.  Orthostatic hypotension  -Is likely secondary to UTI and dehydration.  -Patient started on normal saline at 75 mm.  -I will discontinue the IV fluids.  Diabetes mellitus type 2  -Check hemoglobin A1c, will be on carbohydrate modified diet.  -Insulin sliding scale.   Atrial fibrillation  -Patient has paroxysmal atrial fibrillation, rate is controlled with carvedilol and Multaq.  -He is on Coumadin, supratherapeutic INR, I have asked pharmacy to manage Coumadin dosage.   Cough  -Patient reported that he has chronic cough, this is not new.  -Chest x-ray is clear, SLP to evaluate.   Code Status: Full code Family Communication: Plan discussed with the patient. Disposition Plan: Remains  inpatient   Consultants:  None  Procedures:  None  Antibiotics:  Rocephin   Objective: Filed Vitals:   01/16/14 0639  BP: 117/66  Pulse: 79  Temp:   Resp:     Intake/Output Summary (Last 24 hours) at 01/16/14 0935 Last data filed at 01/16/14 0547  Gross per 24 hour  Intake    462 ml  Output   1700 ml  Net  -1238 ml   Filed Weights   01/14/14 2345  Weight: 84.7 kg (186 lb 11.7 oz)    Exam: General: Alert and awake, oriented x3, not in any acute distress. HEENT: anicteric sclera, pupils reactive to light and accommodation, EOMI CVS: S1-S2 clear, no murmur rubs or gallops Chest: clear to auscultation bilaterally, no wheezing, rales or rhonchi Abdomen: soft nontender, nondistended, normal bowel sounds, no organomegaly Extremities: no cyanosis, clubbing or edema noted bilaterally Neuro: Cranial nerves II-XII intact, no focal neurological deficits  Data Reviewed: Basic Metabolic Panel:  Recent Labs Lab 01/14/14 1308 01/15/14 0626 01/16/14 0700  NA 137 138 138  K 4.7 4.1 3.9  CL 102 105 102  CO2 21 21 22   GLUCOSE 103* 94 113*  BUN 21 17 17   CREATININE 1.22 1.13 1.05  CALCIUM 9.6 9.4 9.7   Liver Function Tests: No results found for this basename: AST, ALT, ALKPHOS, BILITOT, PROT, ALBUMIN,  in the last 168 hours No results found for this basename: LIPASE, AMYLASE,  in the last 168 hours No results found for this basename: AMMONIA,  in the last 168 hours CBC:  Recent Labs Lab 01/14/14 1308 01/15/14 0626 01/16/14 0700  WBC 17.0* 11.5* 8.1  NEUTROABS 13.5*  --   --   HGB 10.6* 9.6* 10.2*  HCT 31.0* 28.7* 30.6*  MCV  91.2 91.4 90.8  PLT 228 157 168   Cardiac Enzymes: No results found for this basename: CKTOTAL, CKMB, CKMBINDEX, TROPONINI,  in the last 168 hours BNP (last 3 results) No results found for this basename: PROBNP,  in the last 8760 hours CBG:  Recent Labs Lab 01/15/14 0803 01/15/14 1148 01/15/14 1756 01/15/14 2217 01/16/14 0733   GLUCAP 100* 118* 109* 115* 113*    Micro Recent Results (from the past 240 hour(s))  URINE CULTURE     Status: None   Collection Time    01/14/14  1:34 PM      Result Value Ref Range Status   Specimen Description URINE, CATHETERIZED   Final   Special Requests NONE   Final   Culture  Setup Time     Final   Value: 01/13/2014 16:35     Performed at [facility]   Colony Count     Final   Value: >=100,000 COLONIES/ML     Performed at [facility]   Culture     Final   Value: ESCHERICHIA COLI     Performed at [facility]   Report Status 01/16/2014 FINAL   Final   Organism ID, Bacteria ESCHERICHIA COLI   Final     Studies: X-ray Chest Pa And Lateral   01/15/2014   CLINICAL DATA:  Cough.  EXAM: CHEST  2 VIEW  COMPARISON:  December 07, 2010.  FINDINGS: The heart size and mediastinal contours are within normal limits. Both lungs are clear. No pneumothorax or pleural effusion is noted. Osteophyte formation is seen anteriorly in the thoracic spine.  IMPRESSION: No acute cardiopulmonary abnormality seen.   Electronically Signed   By: Roque LiasJames  Green M.D.   On: 01/15/2014 08:22    Scheduled Meds: . atorvastatin  20 mg Oral q1800  . carvedilol  6.25 mg Oral BID WC  . cefTRIAXone (ROCEPHIN)  IV  1 g Intravenous Q24H  . docusate sodium  100 mg Oral BID  . dronedarone  400 mg Oral BID WC  . feeding supplement (GLUCERNA SHAKE)  237 mL Oral BID BM  . haloperidol lactate  5 mg Intravenous Once  . insulin aspart  0-9 Units Subcutaneous TID WC  . levothyroxine  50 mcg Oral QAC breakfast  . loratadine  10 mg Oral Daily   Continuous Infusions:       Time spent: 35 minutes    [facility] HospitalELMAHI,Jared Cahn A  [facility] Pager [phone] If 7PM-7AM, please contact night-coverage at [email or web address], password [email or web address] 01/16/2014, 9:35 AM  LOS: 2 days

## 2014-01-16 NOTE — Progress Notes (Signed)
   CARE MANAGEMENT NOTE 01/16/2014  Patient:  Ronald Payne, Ronald Payne   Account Number:  [id number]  Date Initiated:  07/19/2013  Documentation initiated by:  [facility]  Subjective/Objective Assessment:   80 y.o. male with a Past Medical History of advanced senile dementia, severe peripheral vascular disease, COPD, chronic renal disease stage III, chronic diastolic heart failure who presents with SOB// from [facility]     Action/Plan:   diurese//return to [facility]   Anticipated DC Date:  07/20/2013   Anticipated DC Plan:  SKILLED NURSING FACILITY      DC Planning Services  CM consult      [facility] Choice  HOME HEALTH   Choice offered to / List presented to:  C-1 Patient        HH arranged  HH-1 RN  HH-2 PT  HH-3 OT  HH-4 NURSE'S AIDE  HH-5 SPEECH THERAPY  HH-6 SOCIAL WORKER      HH agency  [facility]   Status of service:  Completed, signed off Medicare Important Message given?   (If response is "NO", the following Medicare IM given date fields will be blank) Date Medicare IM given:   Date Additional Medicare IM given:    Discharge Disposition:  HOME W HOME HEALTH SERVICES  Per UR Regulation:    If discussed at Long Length of Stay Meetings, dates discussed:    Comments:  01/16/14 12:25 CM spoke with pt and pt's spouse in room to offer choice for [facility] services.  Pt and spouse choose [facility] to render HHPT/OT/RN/Aide/SW/SLP.  Address and contact numbers verified with pt.  Referral texted to [facility] liason, [place]. No other CM needs were communicated.  Freddy Jaksch, BSN, CM [phone].

## 2014-01-17 NOTE — ED Provider Notes (Signed)
Medical screening examination/treatment/procedure(s) were performed by non-physician practitioner and as supervising physician I was immediately available for consultation/collaboration.   EKG Interpretation   Date/Time:  Friday January 14 2014 12:54:43 EDT Ventricular Rate:  68 PR Interval:    QRS Duration: 106 QT Interval:  447 QTC Calculation: 475 R Axis:   66 Text Interpretation:  Atrial fibrillation Low voltage, precordial leads  Abnormal lateral Q waves ED PHYSICIAN INTERPRETATION AVAILABLE IN [facility] Confirmed by TEST, Record ([id number]) on 01/16/2014 9:17:29 AM       Juliet Rude. Rubin Payor, MD 01/17/14 [phone]

## 2014-02-08 ENCOUNTER — Other Ambulatory Visit (HOSPITAL_COMMUNITY): Payer: Self-pay | Admitting: Family Medicine

## 2014-02-08 DIAGNOSIS — R1310 Dysphagia, unspecified: Secondary | ICD-10-CM

## 2014-02-09 ENCOUNTER — Ambulatory Visit (INDEPENDENT_AMBULATORY_CARE_PROVIDER_SITE_OTHER): Payer: Medicare Other | Admitting: Pharmacist Clinician (PhC)/ Clinical Pharmacy Specialist

## 2014-02-09 VITALS — BP 128/62 | HR 64

## 2014-02-09 DIAGNOSIS — I480 Paroxysmal atrial fibrillation: Secondary | ICD-10-CM

## 2014-02-09 DIAGNOSIS — Z7901 Long term (current) use of anticoagulants: Secondary | ICD-10-CM

## 2014-02-09 DIAGNOSIS — I4891 Unspecified atrial fibrillation: Secondary | ICD-10-CM

## 2014-02-09 LAB — POCT INR: INR: 2.9

## 2014-02-18 ENCOUNTER — Ambulatory Visit (HOSPITAL_COMMUNITY)
Admission: RE | Admit: 2014-02-18 | Discharge: 2014-02-18 | Disposition: A | Discharge status: Home / Self Care | Payer: Medicare Other | Source: Ambulatory Visit | Attending: Family Medicine | Admitting: Family Medicine

## 2014-02-18 ENCOUNTER — Other Ambulatory Visit (HOSPITAL_COMMUNITY): Payer: Self-pay | Admitting: Internal Medicine

## 2014-02-18 DIAGNOSIS — R1310 Dysphagia, unspecified: Secondary | ICD-10-CM | POA: Insufficient documentation

## 2014-02-18 DIAGNOSIS — R1313 Dysphagia, pharyngeal phase: Secondary | ICD-10-CM | POA: Insufficient documentation

## 2014-02-18 DIAGNOSIS — R1311 Dysphagia, oral phase: Secondary | ICD-10-CM | POA: Insufficient documentation

## 2014-02-18 NOTE — Procedures (Signed)
Objective Swallowing Evaluation: Modified Barium Swallowing Study  Patient Details  Name: Ronald Payne MRN: [id number] Date of Birth: 1922-11-20  Today's Date: 02/18/2014 Time: 1050-1120 SLP Time Calculation (min): 30 min  Past Medical History:  Past Medical History  Diagnosis Date  . Shortness of breath   . Hypothyroidism   . Hypertension   . Dysrhythmia     atrial fibrilation  . Arthritis   . Diabetes mellitus     type 2  . Insomnia, unspecified   . Acute bronchitis   . Other and unspecified hyperlipidemia   . Unspecified hearing loss   . Insomnia, unspecified   . Cataract    Past Surgical History:  Past Surgical History  Procedure Laterality Date  . Back surgery    . Colon surgery      2001  . Cardioversion  11/06/2011    Procedure: CARDIOVERSION;  Surgeon: Chrystie Nose;  Location: [facility] OR;  Service: Cardiovascular;  Laterality: N/A;  . Colectomy  2001    Dr. Jamey Ripa   HPI:  80 y.o. male referred for OPMBS due to dysphagia.  Recent admission to [facility] on 01/14/14 with dizziness and UTI. Pt with PMHx of A-fib, DM2, arthritis.  Underwent clinical swallow eval during that admission, with findings of sensory motor oral dysphagia with suspected pharyngeal dysphagia and concerns for aspiration.  Pt was D/Cd home day of eval, so instrumental testing could not be completed.  He is being followed by [facility] SLP and PT.  Currently eats dysphagia 3 diet with nectar-thick liquids.      Assessment / Plan / Recommendation Clinical Impression  Dysphagia Diagnosis: Moderate oral phase dysphagia;Moderate pharyngeal phase dysphagia  Clinical impression: Pt presents with moderate oropharyngeal dysphagia, etiology unclear.  Oral phase is marked by incoordination and poor bolus formation.  There is poor propulsion from the oral cavity. Materials spill prematurely into pharynx.  There is reduced hyolaryngeal elevation and pharyngeal contraction, leading to moderate residue in valleculae and  pyriforms.  Residue from pyriforms tends to spill posteriorly into larynx and penetrates to the vocal cords.  Thin liquids are immediately aspirated, and cough response is not consistently elicited.   Safest PO diet is likely dysphagia 3, nectar-thick liquids; however, pt is at risk for aspirating all POs given deficits.  He has likely been aspirating POs at least since hospitalization one month ago, but thus far has not developed a PNA.  Spoke at length with pt, wife, and dtr re: results, necessity of continuing with dysphagia therapy to address strengthening (particularly lingual and pharyngeal), risk of aspiration, and f/u with primary physician to determine etiology.       Treatment Recommendation  Defer treatment plan to SLP at (Comment)    Diet Recommendation Dysphagia 3 (Mechanical Soft);Nectar-thick liquid   Liquid Administration via: Cup Medication Administration: Crushed with puree Compensations: Multiple dry swallows after each bite/sip;Clear throat intermittently;Hard cough after swallow Postural Changes and/or Swallow Maneuvers: Seated upright 90 degrees    Other  Recommendations Oral Care Recommendations: Oral care BID   Follow Up Recommendations  Home health SLP        General Type of Study: Modified Barium Swallowing Study Reason for Referral: Objectively evaluate swallowing function Previous Swallow Assessment: clinical swallow eval 01/16/14 Diet Prior to this Study: Dysphagia 3 (soft);Nectar-thick liquids Temperature Spikes Noted: No Respiratory Status: Room air History of Recent Intubation: No Behavior/Cognition: Alert;Cooperative Oral Cavity - Dentition: Dentures, bottom;Dentures, top Oral Motor / Sensory Function: Impaired motor Oral impairment:  (recuded lingual  coordination) Self-Feeding Abilities: Able to feed self Patient Positioning: Upright in chair Baseline Vocal Quality: Wet Volitional Cough: Weak Volitional Swallow: Able to elicit Anatomy: Other  (Comment) (appearance of osteophyte near C4) Pharyngeal Secretions: Not observed secondary MBS    Reason for Referral Objectively evaluate swallowing function   Oral Phase Oral Preparation/Oral Phase Oral Phase: Impaired Oral - Nectar Oral - Nectar Cup: Weak lingual manipulation;Lingual pumping;Reduced posterior propulsion;Delayed oral transit Oral - Thin Oral - Thin Cup: Weak lingual manipulation;Lingual pumping;Reduced posterior propulsion;Delayed oral transit Oral - Solids Oral - Puree: Weak lingual manipulation;Lingual pumping;Reduced posterior propulsion;Delayed oral transit Oral - Mechanical Soft: Weak lingual manipulation;Lingual pumping;Reduced posterior propulsion;Delayed oral transit   Pharyngeal Phase Pharyngeal Phase Pharyngeal Phase: Impaired Pharyngeal - Nectar Pharyngeal - Nectar Cup: Premature spillage to pyriform sinuses;Reduced pharyngeal peristalsis;Reduced epiglottic inversion;Reduced anterior laryngeal mobility;Reduced laryngeal elevation;Reduced tongue base retraction;Penetration/Aspiration after swallow;Pharyngeal residue - valleculae;Pharyngeal residue - pyriform sinuses Penetration/Aspiration details (nectar cup): Material enters airway, CONTACTS cords and not ejected out Pharyngeal - Thin Pharyngeal - Thin Cup: Premature spillage to pyriform sinuses;Reduced pharyngeal peristalsis;Reduced epiglottic inversion;Reduced anterior laryngeal mobility;Reduced laryngeal elevation;Reduced tongue base retraction;Penetration/Aspiration after swallow;Pharyngeal residue - valleculae;Pharyngeal residue - pyriform sinuses;Penetration/Aspiration during swallow Penetration/Aspiration details (thin cup): Material enters airway, passes BELOW cords and not ejected out despite cough attempt by patient;Material enters airway, passes BELOW cords without attempt by patient to eject out (silent aspiration) Pharyngeal - Solids Pharyngeal - Puree: Premature spillage to pyriform sinuses;Reduced  pharyngeal peristalsis;Reduced epiglottic inversion;Reduced anterior laryngeal mobility;Reduced laryngeal elevation;Reduced tongue base retraction;Pharyngeal residue - valleculae;Pharyngeal residue - pyriform sinuses Pharyngeal - Mechanical Soft: Premature spillage to pyriform sinuses;Reduced pharyngeal peristalsis;Reduced epiglottic inversion;Reduced anterior laryngeal mobility;Reduced laryngeal elevation;Reduced tongue base retraction;Pharyngeal residue - valleculae;Pharyngeal residue - pyriform sinuses  Cervical Esophageal Phase    GO         Functional Assessment Tool Used: clinical judgement Functional Limitations: Swallowing Swallow Current Status ([id number]): At least 40 percent but less than 60 percent impaired, limited or restricted Swallow Goal Status [phone](G8997): At least 40 percent but less than 60 percent impaired, limited or restricted Swallow Discharge Status [phone](G8998): At least 40 percent but less than 60 percent impaired, limited or restricted    Carolan Shivermanda Laurice Marily Konczal 02/18/2014, 12:17 PM  Marchelle FolksAmanda L. Samson Fredericouture, [place]MA CCC/SLP Pager [phone]

## 2014-03-09 ENCOUNTER — Ambulatory Visit (INDEPENDENT_AMBULATORY_CARE_PROVIDER_SITE_OTHER): Payer: Medicare Other | Admitting: Pharmacist Clinician (PhC)/ Clinical Pharmacy Specialist

## 2014-03-09 DIAGNOSIS — I480 Paroxysmal atrial fibrillation: Secondary | ICD-10-CM

## 2014-03-09 DIAGNOSIS — I4891 Unspecified atrial fibrillation: Secondary | ICD-10-CM

## 2014-03-09 DIAGNOSIS — Z7901 Long term (current) use of anticoagulants: Secondary | ICD-10-CM

## 2014-03-09 LAB — POCT INR: INR: 3.4

## 2014-03-23 ENCOUNTER — Ambulatory Visit (INDEPENDENT_AMBULATORY_CARE_PROVIDER_SITE_OTHER): Payer: Medicare Other | Admitting: Pharmacist Clinician (PhC)/ Clinical Pharmacy Specialist

## 2014-03-23 DIAGNOSIS — Z7901 Long term (current) use of anticoagulants: Secondary | ICD-10-CM

## 2014-03-23 DIAGNOSIS — I4891 Unspecified atrial fibrillation: Secondary | ICD-10-CM

## 2014-03-23 DIAGNOSIS — I480 Paroxysmal atrial fibrillation: Secondary | ICD-10-CM

## 2014-03-23 LAB — POCT INR: INR: 2.5

## 2014-03-25 ENCOUNTER — Other Ambulatory Visit (HOSPITAL_COMMUNITY): Payer: Self-pay | Admitting: Family Medicine

## 2014-03-25 DIAGNOSIS — R1310 Dysphagia, unspecified: Secondary | ICD-10-CM

## 2014-04-05 ENCOUNTER — Ambulatory Visit (HOSPITAL_COMMUNITY)
Admission: RE | Admit: 2014-04-05 | Discharge: 2014-04-05 | Disposition: A | Discharge status: Home / Self Care | Payer: Medicare Other | Source: Ambulatory Visit | Attending: Family Medicine | Admitting: Family Medicine

## 2014-04-05 DIAGNOSIS — R1310 Dysphagia, unspecified: Secondary | ICD-10-CM | POA: Insufficient documentation

## 2014-04-05 DIAGNOSIS — R1311 Dysphagia, oral phase: Secondary | ICD-10-CM | POA: Insufficient documentation

## 2014-04-05 DIAGNOSIS — R1313 Dysphagia, pharyngeal phase: Secondary | ICD-10-CM | POA: Insufficient documentation

## 2014-04-05 NOTE — Procedures (Signed)
Objective Swallowing Evaluation: Modified Barium Swallowing Study  Patient Details  Name: Ronald Payne MRN: [id number] Date of Birth: [birth year]  Today's Date: 04/05/2014 Time: 1040-1120 SLP Time Calculation (min): 40 min  Past Medical History:  Past Medical History  Diagnosis Date  . Shortness of breath   . Hypothyroidism   . Hypertension   . Dysrhythmia     atrial fibrilation  . Arthritis   . Diabetes mellitus     type 2  . Insomnia, unspecified   . Acute bronchitis   . Other and unspecified hyperlipidemia   . Unspecified hearing loss   . Insomnia, unspecified   . Cataract    Past Surgical History:  Past Surgical History  Procedure Laterality Date  . Back surgery    . Colon surgery      2001  . Cardioversion  11/06/2011    Procedure: CARDIOVERSION;  Surgeon: Chrystie Nose;  Location: [facility] OR;  Service: Cardiovascular;  Laterality: N/A;  . Colectomy  2001    Dr. Jamey Ripa   HPI:  80 y.o. male referred for OPMBS due to dysphagia.  OPMBS 4/24 with findings of moderate dysphagia due to generalized weakness with resulting aspiration.  Pt has undergone six weeks of dysphagia therapy.  Hx includes admission to [facility] on 01/14/14 with dizziness and UTI. Pt with PMHx of A-fib, DM2, arthritis.  Underwent clinical swallow eval during that admission, with findings of sensory motor oral dysphagia with suspected pharyngeal dysphagia and concerns for aspiration.  Currently eats dysphagia 3 diet with nectar-thick liquids.        Assessment / Plan / Recommendation Clinical Impression  Dysphagia Diagnosis: Mild oral phase dysphagia;Mild pharyngeal phase dysphagia Clinical impression: Pt continues to present with an oropharyngeal dysphagia; however, function has improved since 4/24 MBS: he demonstrates improved oral coordination; persisting premature spillage of materials into pharynx prior to swallow trigger.  Mobility of the hyolaryngeal complex has increased, leading to less pharyngeal  residue for all consistencies.  Notable is appearance of likely osteophyte (not confirmed by MD) which presents structural obstacle to full inversion of epiglottis over larynx.  Pt continues to demonstrate penetration of nectar-thick liquids to the vocal cords; volitional throat-clearing is protective and serves to eject penetrate from larynx.  Thin liquids are more likely to be aspirated during the swallow; however, effortful swallow and supraglottic swallow appear to offer some benefit in reducing amount of aspirate.  Recommend continuing HHSLP for therapeutic exercise; consider neurology consult to determine etiology of dysphagia.       Treatment Recommendation  Defer treatment plan to SLP at (Comment)    Diet Recommendation Dysphagia 3 (Mechanical Soft);Nectar-thick liquid   Liquid Administration via: Cup Medication Administration: Crushed with puree Supervision: Patient able to self feed Compensations: Multiple dry swallows after each bite/sip;Clear throat after each swallow;Hard cough after swallow;Effortful swallow (supraglottic swallow) Postural Changes and/or Swallow Maneuvers: Seated upright 90 degrees    Other  Recommendations Oral Care Recommendations: Oral care BID   Follow Up Recommendations  Home health SLP    Frequency and Duration            SLP Swallow Goals     General HPI: 80 y.o. male referred for OPMBS due to dysphagia.  OPMBS 4/24 with findings of moderate dysphagia due to generalized weakness with resulting aspiration.  Pt has undergone six weeks of dysphagia therapy.  Hx includes admission to [facility] on 01/14/14 with dizziness and UTI. Pt with PMHx of A-fib, DM2, arthritis.  Underwent clinical swallow  eval during that admission, with findings of sensory motor oral dysphagia with suspected pharyngeal dysphagia and concerns for aspiration.  Pt was D/Cd home day of eval, so instrumental testing could not be completed.  [facility]  Currently eats  dysphagia 3 diet with nectar-thick liquids.    Type of Study: Modified Barium Swallowing Study Reason for Referral: Objectively evaluate swallowing function Previous Swallow Assessment: clinical swallow eval 01/16/14; OPMBS 02/18/14 Diet Prior to this Study: Dysphagia 3 (soft);Nectar-thick liquids Temperature Spikes Noted: No Respiratory Status: Room air History of Recent Intubation: No Behavior/Cognition: Alert;Cooperative Oral Cavity - Dentition: Dentures, bottom;Dentures, top Oral Motor / Sensory Function: Impaired motor Self-Feeding Abilities: Able to feed self Patient Positioning: Upright in chair Baseline Vocal Quality: Wet Volitional Cough: Weak Volitional Swallow: Able to elicit Anatomy: Other (Comment) (appearance of osteophyte near C4- not confirmed by radiologi) Pharyngeal Secretions: Not observed secondary MBS    Reason for Referral Objectively evaluate swallowing function   Oral Phase Oral Preparation/Oral Phase Oral Phase: Impaired Oral - Nectar Oral - Nectar Cup: Weak lingual manipulation Oral - Thin Oral - Thin Cup: Weak lingual manipulation Oral - Solids Oral - Puree: Weak lingual manipulation Oral - Mechanical Soft: Weak lingual manipulation   Pharyngeal Phase Pharyngeal Phase Pharyngeal Phase: Impaired Pharyngeal - Nectar Pharyngeal - Nectar Cup: Premature spillage to pyriform sinuses;Reduced pharyngeal peristalsis;Reduced epiglottic inversion;Reduced anterior laryngeal mobility;Reduced laryngeal elevation;Reduced tongue base retraction;Penetration/Aspiration after swallow;Pharyngeal residue - valleculae;Pharyngeal residue - pyriform sinuses Penetration/Aspiration details (nectar cup): Material enters airway, CONTACTS cords then ejected out Pharyngeal - Thin Pharyngeal - Thin Cup: Premature spillage to pyriform sinuses;Reduced pharyngeal peristalsis;Reduced epiglottic inversion;Reduced anterior laryngeal mobility;Reduced laryngeal elevation;Reduced tongue base  retraction;Penetration/Aspiration after swallow;Pharyngeal residue - valleculae;Pharyngeal residue - pyriform sinuses;Penetration/Aspiration during swallow Penetration/Aspiration details (thin cup): Material enters airway, passes BELOW cords and not ejected out despite cough attempt by patient Pharyngeal - Solids Pharyngeal - Puree: Premature spillage to pyriform sinuses;Reduced pharyngeal peristalsis;Reduced epiglottic inversion;Reduced anterior laryngeal mobility;Reduced laryngeal elevation;Reduced tongue base retraction;Pharyngeal residue - valleculae;Pharyngeal residue - pyriform sinuses Penetration/Aspiration details (puree): Material enters airway, remains ABOVE vocal cords then ejected out  Cervical Esophageal Phase    GO    Cervical Esophageal Phase Cervical Esophageal Phase: [facility]    Functional Assessment Tool Used: clinical judgement Functional Limitations: Swallowing Swallow Current Status ([id number]): At least 40 percent but less than 60 percent impaired, limited or restricted Swallow Goal Status [phone]): At least 40 percent but less than 60 percent impaired, limited or restricted Swallow Discharge Status [phone]([id number]): At least 40 percent but less than 60 percent impaired, limited or restricted    Carolan Shivermanda Laurice Oshua Mccon[place] 04/05/2014, 1:12 PM

## 2014-04-20 ENCOUNTER — Ambulatory Visit (INDEPENDENT_AMBULATORY_CARE_PROVIDER_SITE_OTHER): Payer: Medicare Other | Admitting: Pharmacist Clinician (PhC)/ Clinical Pharmacy Specialist

## 2014-04-20 VITALS — Wt 189.4 lb

## 2014-04-20 DIAGNOSIS — I480 Paroxysmal atrial fibrillation: Secondary | ICD-10-CM

## 2014-04-20 DIAGNOSIS — Z7901 Long term (current) use of anticoagulants: Secondary | ICD-10-CM

## 2014-04-20 DIAGNOSIS — I4891 Unspecified atrial fibrillation: Secondary | ICD-10-CM

## 2014-04-20 LAB — POCT INR: INR: 3.5

## 2014-05-11 ENCOUNTER — Ambulatory Visit (INDEPENDENT_AMBULATORY_CARE_PROVIDER_SITE_OTHER): Payer: Medicare Other | Admitting: Pharmacist

## 2014-05-11 DIAGNOSIS — I4891 Unspecified atrial fibrillation: Secondary | ICD-10-CM

## 2014-05-11 DIAGNOSIS — I480 Paroxysmal atrial fibrillation: Secondary | ICD-10-CM

## 2014-05-11 DIAGNOSIS — Z7901 Long term (current) use of anticoagulants: Secondary | ICD-10-CM

## 2014-05-11 LAB — POCT INR: INR: 2.9

## 2014-06-08 ENCOUNTER — Encounter: Payer: Self-pay | Admitting: Cardiovascular Disease

## 2014-06-08 ENCOUNTER — Ambulatory Visit (INDEPENDENT_AMBULATORY_CARE_PROVIDER_SITE_OTHER): Payer: Medicare Other | Admitting: Pharmacist Clinician (PhC)/ Clinical Pharmacy Specialist

## 2014-06-08 ENCOUNTER — Ambulatory Visit (INDEPENDENT_AMBULATORY_CARE_PROVIDER_SITE_OTHER): Payer: Medicare Other | Admitting: Cardiovascular Disease

## 2014-06-08 VITALS — BP 138/74 | HR 64 | Resp 16 | Ht 72.0 in | Wt 182.6 lb

## 2014-06-08 DIAGNOSIS — I4891 Unspecified atrial fibrillation: Secondary | ICD-10-CM

## 2014-06-08 DIAGNOSIS — I480 Paroxysmal atrial fibrillation: Secondary | ICD-10-CM

## 2014-06-08 DIAGNOSIS — Z7901 Long term (current) use of anticoagulants: Secondary | ICD-10-CM

## 2014-06-08 DIAGNOSIS — I10 Essential (primary) hypertension: Secondary | ICD-10-CM

## 2014-06-08 DIAGNOSIS — N183 Chronic kidney disease, stage 3 unspecified: Secondary | ICD-10-CM

## 2014-06-08 DIAGNOSIS — I428 Other cardiomyopathies: Secondary | ICD-10-CM

## 2014-06-08 DIAGNOSIS — I4892 Unspecified atrial flutter: Secondary | ICD-10-CM

## 2014-06-08 LAB — POCT INR: INR: 3

## 2014-06-08 NOTE — Progress Notes (Signed)
Patient ID: Ronald Payne, male   DOB: [birth year], 80 y.o.   MRN: [id number]     Reason for office visit Atrial fibrillation, nonischemic cardiomyopathy  Mr. 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 He has a history of recurrent symptomatic atrial fibrillation but has not been arrhythmia free for over 2 years now. He has a history of previous radiofrequency ablation for atrial flutter in 2008 and ablation for atrial fibrillation at [facility] in 2011. He has mild ischemic cardiomyopathy. His most recent echocardiogram showed left ventricular ejection fraction of 45-50%. He is maintaining sinus rhythm on treatment with Multaq and takes brand name Coumadin for anticoagulation. He had hyperthyroidism while on Amiodarone. He has not had any serious bleeding problems and has not had stroke or TIA. He has treated hypertension and hyperlipidemia  As on his last appointment his only real complaint is of severe constipation which he traces back to his partial colectomy.    Allergies  Allergen Reactions  . Amiodarone Other (See Comments)    Hyperthyroid on Amio in the past, changed to Multaq    Current Outpatient Prescriptions  Medication Sig Dispense Refill  . benzonatate (TESSALON) 100 MG capsule Take 100 mg by mouth 3 (three) times daily as needed for cough.      . carvedilol (COREG) 6.25 MG tablet Take 6.25 mg by mouth 2 (two) times daily with a meal.      . cetirizine (ZYRTEC) 10 MG tablet Take 10 mg by mouth daily. Take 1 tablet daily for allergies.      Marland Kitchen docusate sodium (COLACE) 100 MG capsule Take 100 mg by mouth 3 (three) times daily as needed. For constipation       . dronedarone (MULTAQ) 400 MG tablet Take 1 tablet (400 mg total) by mouth 2 (two) times daily with a meal.  180 tablet  3  . Glucosamine-Chondroitin (COSAMIN DS PO) Take 1 tablet by mouth 2 (two) times daily.        Marland Kitchen HYDROcodone-acetaminophen (NORCO/VICODIN) 5-325 MG  per tablet Take 1 tablet by mouth every 6 (six) hours as needed for moderate pain.      Marland Kitchen levothyroxine (SYNTHROID, LEVOTHROID) 50 MCG tablet Take 50 mcg by mouth daily.        Marland Kitchen lovastatin (MEVACOR) 40 MG tablet Take 40 mg by mouth at bedtime. Take 40 mg daily to control cholesterol.      . metFORMIN (GLUCOPHAGE) 500 MG tablet Take 500 mg by mouth 2 (two) times daily with a meal. Take 1 tablet twice daily for blood sugar.      . warfarin (COUMADIN) 5 MG tablet Take 2.5-5 mg by mouth daily. Take 2.5mg  on Tues & Fri, All other days including Sunday take 5mg       No current facility-administered medications for this visit.    Past Medical History  Diagnosis Date  . Shortness of breath   . Hypothyroidism   . Hypertension   . Dysrhythmia     atrial fibrilation  . Arthritis   . Diabetes mellitus     type 2  . Insomnia, unspecified   . Acute bronchitis   . Other and unspecified hyperlipidemia   . Unspecified hearing loss   . Insomnia, unspecified   . Cataract     Past Surgical History  Procedure Laterality Date  . Back surgery    . Colon surgery      20 01  . Cardioversion  11/06/2011  Procedure: CARDIOVERSION;  Surgeon: Chrystie NoseKenneth C. Hilty;  Location: [facility] OR;  Service: Cardiovascular;  Laterality: N/A;  . Colectomy  2001    Dr. [name]    Family History  Problem Relation Age of Onset  . Heart disease Mother     History   Social History  . Marital Status: Married    Spouse Name: N/A    Number of Children: N/A  . Years of Education: N/A   Occupational History  . Not on file.   Social History Main Topics  . Smoking status: Former Smoker    Quit date: 01/14/1994  . Smokeless tobacco: Not on file     Comment: quit years ago "  . Alcohol Use: No  . Drug Use: No  . Sexual Activity: Not Currently   Other Topics Concern  . Not on file   Social History Narrative  . No narrative on file    Review of systems: The patient specifically denies any chest pain at rest  exertion, dyspnea at rest or with exertion, orthopnea, paroxysmal nocturnal dyspnea, syncope, palpitations, focal neurological deficits, intermittent claudication, lower extremity edema, unexplained weight gain, cough, hemoptysis or wheezing.   PHYSICAL EXAM BP 138/74  Pulse 64  Resp 16  Ht 6' (1.829 m)  Wt 182 lb 9.6 oz (82.827 kg)  BMI 24.76 kg/m2 General: Alert, oriented x3, no distress  Head: no evidence of trauma, PERRL, EOMI, no exophtalmos or lid lag, no myxedema, no xanthelasma; normal ears, nose and oropharynx  Neck: normal jugular venous pulsations and no hepatojugular reflux; brisk carotid pulses without delay and no carotid bruits  Chest: clear to auscultation, no signs of consolidation by percussion or palpation, normal fremitus, symmetrical and full respiratory excursions  Cardiovascular: normal position and quality of the apical impulse, regular rhythm, normal first and second heart sounds, no murmurs, rubs or gallops  Abdomen: no tenderness or distention, no masses by palpation, no abnormal pulsatility or arterial bruits, normal bowel sounds, no hepatosplenomegaly  Extremities: no clubbing, cyanosis or edema; 2+ radial, ulnar and brachial pulses bilaterally; 2+ right femoral, posterior tibial and dorsalis pedis pulses; 2+ left femoral, posterior tibial and dorsalis pedis pulses; no subclavian or femoral bruits  Neurological: grossly nonfocal   EKG: Normal sinus rhythm, first degree AV block (PR 210 ms) QTC 470 ms  BMET    Component Value Date/Time   NA 138 01/16/2014 0700   K 3.9 01/16/2014 0700   CL 102 01/16/2014 0700   CO2 22 01/16/2014 0700   GLUCOSE 113* 01/16/2014 0700   BUN 17 01/16/2014 0700   CREATININE 1.05 01/16/2014 0700   CALCIUM 9.7 01/16/2014 0700   GFRNONAA 61* 01/16/2014 0700   GFRAA 71* 01/16/2014 0700     ASSESSMENT AND PLAN  PAF (paroxysmal atrial fibrillation), recurrent symptomatic AF this admission  No symptomatic recurrences in over 2 years.  Tolerates multaq. Therapeutically anticoagulated with warfarin without complications. Mild QT prolongation is similar to last year and is attributable to his antiarrhythmic.  NICM (nonischemic cardiomyopathy), 45-50% 2D 2013  No clinical evidence of congestive heart failure. On carvedilol.   Diabetes mellitus, Type 2 NIDDM  Well-controlled on metformin monotherapy   HTN (hypertension) Fair control  Orders Placed This Encounter  Procedures  . EKG 12-Lead    Tanvi Gatling  Thurmon FairMihai Shykeria Sakamoto, MD, [facility] [phone] office [phone] pager

## 2014-06-08 NOTE — Patient Instructions (Signed)
Dr. Croitoru recommends that you schedule a follow-up appointment in: One year.   

## 2014-06-29 ENCOUNTER — Ambulatory Visit (INDEPENDENT_AMBULATORY_CARE_PROVIDER_SITE_OTHER): Payer: Medicare Other | Admitting: Pharmacist Clinician (PhC)/ Clinical Pharmacy Specialist

## 2014-06-29 DIAGNOSIS — Z7901 Long term (current) use of anticoagulants: Secondary | ICD-10-CM

## 2014-06-29 DIAGNOSIS — I480 Paroxysmal atrial fibrillation: Secondary | ICD-10-CM

## 2014-06-29 DIAGNOSIS — I4891 Unspecified atrial fibrillation: Secondary | ICD-10-CM

## 2014-06-29 LAB — POCT INR: INR: 2.2

## 2014-07-27 ENCOUNTER — Ambulatory Visit (INDEPENDENT_AMBULATORY_CARE_PROVIDER_SITE_OTHER): Payer: Medicare Other | Admitting: Pharmacist Clinician (PhC)/ Clinical Pharmacy Specialist

## 2014-07-27 DIAGNOSIS — Z7901 Long term (current) use of anticoagulants: Secondary | ICD-10-CM

## 2014-07-27 DIAGNOSIS — I480 Paroxysmal atrial fibrillation: Secondary | ICD-10-CM

## 2014-07-27 DIAGNOSIS — I4891 Unspecified atrial fibrillation: Secondary | ICD-10-CM

## 2014-07-27 LAB — POCT INR: INR: 2.3

## 2014-09-07 ENCOUNTER — Ambulatory Visit (INDEPENDENT_AMBULATORY_CARE_PROVIDER_SITE_OTHER): Payer: Medicare Other | Admitting: Pharmacist Clinician (PhC)/ Clinical Pharmacy Specialist

## 2014-09-07 DIAGNOSIS — I480 Paroxysmal atrial fibrillation: Secondary | ICD-10-CM

## 2014-09-07 DIAGNOSIS — Z7901 Long term (current) use of anticoagulants: Secondary | ICD-10-CM

## 2014-09-07 LAB — POCT INR: INR: 1.7

## 2014-09-27 ENCOUNTER — Emergency Department (HOSPITAL_COMMUNITY): Payer: Medicare Other

## 2014-09-27 ENCOUNTER — Inpatient Hospital Stay (HOSPITAL_COMMUNITY)
Admission: EM | Admit: 2014-09-27 | Discharge: 2014-09-29 | DRG: 872 | Disposition: A | Discharge status: Home Health | Payer: Medicare Other | Attending: Internal Medicine | Admitting: Internal Medicine

## 2014-09-27 ENCOUNTER — Encounter (HOSPITAL_COMMUNITY): Payer: Self-pay | Admitting: Emergency Medicine

## 2014-09-27 DIAGNOSIS — H269 Unspecified cataract: Secondary | ICD-10-CM | POA: Diagnosis present

## 2014-09-27 DIAGNOSIS — M1990 Unspecified osteoarthritis, unspecified site: Secondary | ICD-10-CM | POA: Diagnosis present

## 2014-09-27 DIAGNOSIS — N390 Urinary tract infection, site not specified: Secondary | ICD-10-CM | POA: Diagnosis present

## 2014-09-27 DIAGNOSIS — Z7901 Long term (current) use of anticoagulants: Secondary | ICD-10-CM

## 2014-09-27 DIAGNOSIS — Z789 Other specified health status: Secondary | ICD-10-CM

## 2014-09-27 DIAGNOSIS — Z79899 Other long term (current) drug therapy: Secondary | ICD-10-CM

## 2014-09-27 DIAGNOSIS — N183 Chronic kidney disease, stage 3 unspecified: Secondary | ICD-10-CM | POA: Diagnosis present

## 2014-09-27 DIAGNOSIS — I428 Other cardiomyopathies: Secondary | ICD-10-CM

## 2014-09-27 DIAGNOSIS — E119 Type 2 diabetes mellitus without complications: Secondary | ICD-10-CM

## 2014-09-27 DIAGNOSIS — I10 Essential (primary) hypertension: Secondary | ICD-10-CM | POA: Diagnosis present

## 2014-09-27 DIAGNOSIS — I951 Orthostatic hypotension: Secondary | ICD-10-CM

## 2014-09-27 DIAGNOSIS — E039 Hypothyroidism, unspecified: Secondary | ICD-10-CM | POA: Diagnosis present

## 2014-09-27 DIAGNOSIS — W19XXXA Unspecified fall, initial encounter: Secondary | ICD-10-CM

## 2014-09-27 DIAGNOSIS — N400 Enlarged prostate without lower urinary tract symptoms: Secondary | ICD-10-CM | POA: Diagnosis present

## 2014-09-27 DIAGNOSIS — B9620 Unspecified Escherichia coli [E. coli] as the cause of diseases classified elsewhere: Secondary | ICD-10-CM | POA: Diagnosis present

## 2014-09-27 DIAGNOSIS — E785 Hyperlipidemia, unspecified: Secondary | ICD-10-CM | POA: Diagnosis present

## 2014-09-27 DIAGNOSIS — A419 Sepsis, unspecified organism: Principal | ICD-10-CM

## 2014-09-27 DIAGNOSIS — I129 Hypertensive chronic kidney disease with stage 1 through stage 4 chronic kidney disease, or unspecified chronic kidney disease: Secondary | ICD-10-CM | POA: Diagnosis present

## 2014-09-27 DIAGNOSIS — Z66 Do not resuscitate: Secondary | ICD-10-CM | POA: Diagnosis present

## 2014-09-27 DIAGNOSIS — I480 Paroxysmal atrial fibrillation: Secondary | ICD-10-CM | POA: Diagnosis present

## 2014-09-27 DIAGNOSIS — R509 Fever, unspecified: Secondary | ICD-10-CM | POA: Diagnosis not present

## 2014-09-27 DIAGNOSIS — N2889 Other specified disorders of kidney and ureter: Secondary | ICD-10-CM | POA: Diagnosis present

## 2014-09-27 LAB — URINALYSIS, ROUTINE W REFLEX MICROSCOPIC
Bilirubin Urine: NEGATIVE
GLUCOSE, UA: NEGATIVE mg/dL
HGB URINE DIPSTICK: NEGATIVE
Ketones, ur: NEGATIVE mg/dL
Nitrite: NEGATIVE
PH: 6.5 (ref 5.0–8.0)
Protein, ur: NEGATIVE mg/dL
SPECIFIC GRAVITY, URINE: 1.015 (ref 1.005–1.030)
Urobilinogen, UA: 1 mg/dL (ref 0.0–1.0)

## 2014-09-27 LAB — BASIC METABOLIC PANEL
Anion gap: 15 (ref 5–15)
BUN: 21 mg/dL (ref 6–23)
CHLORIDE: 97 meq/L (ref 96–112)
CO2: 22 mEq/L (ref 19–32)
CREATININE: 1.07 mg/dL (ref 0.50–1.35)
Calcium: 9.7 mg/dL (ref 8.4–10.5)
GFR calc Af Amer: 69 mL/min — ABNORMAL LOW (ref >90)
GFR calc non Af Amer: 59 mL/min — ABNORMAL LOW (ref >90)
Glucose, Bld: 107 mg/dL — ABNORMAL HIGH (ref 70–99)
Potassium: 3.9 mEq/L (ref 3.7–5.3)
Sodium: 134 mEq/L — ABNORMAL LOW (ref 137–147)

## 2014-09-27 LAB — CBC
HEMATOCRIT: 30.7 % — AB (ref 39.0–52.0)
Hemoglobin: 10.2 g/dL — ABNORMAL LOW (ref 13.0–17.0)
MCH: 29.7 pg (ref 26.0–34.0)
MCHC: 33.2 g/dL (ref 30.0–36.0)
MCV: 89.2 fL (ref 78.0–100.0)
Platelets: 177 10*3/uL (ref 150–400)
RBC: 3.44 MIL/uL — ABNORMAL LOW (ref 4.22–5.81)
RDW: 13.6 % (ref 11.5–15.5)
WBC: 9.4 10*3/uL (ref 4.0–10.5)

## 2014-09-27 LAB — GLUCOSE, CAPILLARY
GLUCOSE-CAPILLARY: 115 mg/dL — AB (ref 70–99)
GLUCOSE-CAPILLARY: 99 mg/dL (ref 70–99)
GLUCOSE-CAPILLARY: 119 mg/dL — AB (ref 70–99)
Glucose-Capillary: 123 mg/dL — ABNORMAL HIGH (ref 70–99)

## 2014-09-27 LAB — PROTIME-INR
INR: 2.06 — ABNORMAL HIGH (ref 0.00–1.49)
Prothrombin Time: 23.4 seconds — ABNORMAL HIGH (ref 11.6–15.2)

## 2014-09-27 LAB — CBG MONITORING, ED: Glucose-Capillary: 108 mg/dL — ABNORMAL HIGH (ref 70–99)

## 2014-09-27 LAB — I-STAT CG4 LACTIC ACID, ED: Lactic Acid, Venous: 0.99 mmol/L (ref 0.5–2.2)

## 2014-09-27 LAB — I-STAT TROPONIN, ED: TROPONIN I, POC: 0 ng/mL (ref 0.00–0.08)

## 2014-09-27 LAB — URINE MICROSCOPIC-ADD ON

## 2014-09-27 MED ORDER — SODIUM CHLORIDE 0.9 % IV SOLN: INTRAVENOUS | Status: DC

## 2014-09-27 MED ORDER — LORATADINE 10 MG PO TABS
10.0000 mg | ORAL_TABLET | Freq: Every day | ORAL | Status: DC
  Administered 2014-09-27 – 2014-09-29 (×3): 10 mg via ORAL
  Filled 2014-09-27 (×3): qty 1

## 2014-09-27 MED ORDER — DOCUSATE SODIUM 100 MG PO CAPS
100.0000 mg | ORAL_CAPSULE | Freq: Three times a day (TID) | ORAL | Status: DC
  Administered 2014-09-27 – 2014-09-29 (×7): 100 mg via ORAL
  Filled 2014-09-27 (×9): qty 1

## 2014-09-27 MED ORDER — INSULIN ASPART 100 UNIT/ML ~~LOC~~ SOLN
0.0000 [IU] | Freq: Three times a day (TID) | SUBCUTANEOUS | Status: DC
  Administered 2014-09-27 – 2014-09-29 (×4): 1 [IU] via SUBCUTANEOUS
  Administered 2014-09-29: 2 [IU] via SUBCUTANEOUS

## 2014-09-27 MED ORDER — DRONEDARONE HCL 400 MG PO TABS
400.0000 mg | ORAL_TABLET | Freq: Two times a day (BID) | ORAL | Status: DC
  Administered 2014-09-27 – 2014-09-29 (×4): 400 mg via ORAL
  Filled 2014-09-27 (×7): qty 1

## 2014-09-27 MED ORDER — WARFARIN SODIUM 5 MG PO TABS
5.0000 mg | ORAL_TABLET | ORAL | Status: DC
  Administered 2014-09-27: 5 mg via ORAL
  Filled 2014-09-27 (×2): qty 1

## 2014-09-27 MED ORDER — SODIUM CHLORIDE 0.9 % IV SOLN
INTRAVENOUS | Status: DC
  Administered 2014-09-27 (×2): via INTRAVENOUS

## 2014-09-27 MED ORDER — ACETAMINOPHEN 325 MG PO TABS
650.0000 mg | ORAL_TABLET | Freq: Four times a day (QID) | ORAL | Status: DC | PRN
  Administered 2014-09-27: 650 mg via ORAL
  Filled 2014-09-27: qty 2

## 2014-09-27 MED ORDER — SODIUM CHLORIDE 0.9 % IJ SOLN
3.0000 mL | Freq: Two times a day (BID) | INTRAMUSCULAR | Status: DC
  Administered 2014-09-28 – 2014-09-29 (×2): 3 mL via INTRAVENOUS

## 2014-09-27 MED ORDER — RESOURCE THICKENUP CLEAR PO POWD
ORAL | Status: DC | PRN
  Filled 2014-09-27: qty 125

## 2014-09-27 MED ORDER — HYDROCODONE-ACETAMINOPHEN 5-325 MG PO TABS: 1.0000 | ORAL_TABLET | Freq: Four times a day (QID) | ORAL | Status: DC | PRN

## 2014-09-27 MED ORDER — SODIUM CHLORIDE 0.9 % IV BOLUS (SEPSIS)
500.0000 mL | Freq: Once | INTRAVENOUS | Status: AC
  Administered 2014-09-27: 500 mL via INTRAVENOUS

## 2014-09-27 MED ORDER — BENZONATATE 100 MG PO CAPS
100.0000 mg | ORAL_CAPSULE | Freq: Three times a day (TID) | ORAL | Status: DC | PRN
  Filled 2014-09-27: qty 1

## 2014-09-27 MED ORDER — WARFARIN SODIUM 2.5 MG PO TABS
2.5000 mg | ORAL_TABLET | ORAL | Status: DC
  Administered 2014-09-28: 2.5 mg via ORAL
  Filled 2014-09-27: qty 1

## 2014-09-27 MED ORDER — WARFARIN - PHARMACIST DOSING INPATIENT: Freq: Every day | Status: DC

## 2014-09-27 MED ORDER — BOOST PLUS PO LIQD
237.0000 mL | Freq: Two times a day (BID) | ORAL | Status: DC
  Administered 2014-09-27 – 2014-09-29 (×4): 237 mL via ORAL
  Filled 2014-09-27 (×7): qty 237

## 2014-09-27 MED ORDER — DEXTROSE 5 % IV SOLN
1.0000 g | Freq: Once | INTRAVENOUS | Status: AC
  Administered 2014-09-27: 1 g via INTRAVENOUS
  Filled 2014-09-27: qty 10

## 2014-09-27 MED ORDER — DEXTROSE 5 % IV SOLN
1.0000 g | INTRAVENOUS | Status: DC
  Administered 2014-09-27 – 2014-09-28 (×2): 1 g via INTRAVENOUS
  Filled 2014-09-27 (×4): qty 10

## 2014-09-27 MED ORDER — GLUCOSAMINE-CHONDROITIN 500-400 MG PO CAPS: 1.0000 | ORAL_CAPSULE | Freq: Two times a day (BID) | ORAL | Status: DC

## 2014-09-27 MED ORDER — LEVOTHYROXINE SODIUM 50 MCG PO TABS
50.0000 ug | ORAL_TABLET | Freq: Every day | ORAL | Status: DC
  Administered 2014-09-28 – 2014-09-29 (×2): 50 ug via ORAL
  Filled 2014-09-27 (×4): qty 1

## 2014-09-27 MED ORDER — POLYETHYLENE GLYCOL 3350 17 G PO PACK
17.0000 g | PACK | Freq: Every day | ORAL | Status: DC
  Administered 2014-09-27 – 2014-09-29 (×3): 17 g via ORAL
  Filled 2014-09-27 (×3): qty 1

## 2014-09-27 MED ORDER — FLUTICASONE PROPIONATE 50 MCG/ACT NA SUSP: 1.0000 | Freq: Every day | NASAL | Status: DC | PRN

## 2014-09-27 MED ORDER — PRAVASTATIN SODIUM 40 MG PO TABS
40.0000 mg | ORAL_TABLET | Freq: Every day | ORAL | Status: DC
  Administered 2014-09-27 – 2014-09-28 (×2): 40 mg via ORAL
  Filled 2014-09-27 (×3): qty 1

## 2014-09-27 MED ORDER — CARVEDILOL 3.125 MG PO TABS
3.1250 mg | ORAL_TABLET | Freq: Two times a day (BID) | ORAL | Status: DC
Start: 2014-09-27 — End: 2014-09-28
  Administered 2014-09-27 – 2014-09-28 (×2): 3.125 mg via ORAL
  Filled 2014-09-27 (×5): qty 1

## 2014-09-27 NOTE — Progress Notes (Signed)
INITIAL NUTRITION ASSESSMENT  DOCUMENTATION CODES Per approved criteria  -Not Applicable   INTERVENTION: - Boost Plus BID, supplement needs to be thickened to nectar-thick  NUTRITION DIAGNOSIS: Inadequate oral intake related to UTI as evidenced by poor appetite and weight loss.   Goal: Pt to meet >/= 90% of their estimated nutrition needs   Monitor:  Weight trend, po intake, acceptance of supplements, labs  Reason for Assessment: MST  81 y.o. male  Admitting Dx: UTI (lower urinary tract infection)  ASSESSMENT: 80 y.o. male with past medical history of diabetes mellitus, hypothyroidism, hyperlipidemia, A. fib on Coumadin, hypertension, BPH, who presents with burning on urination, fever, generalized weakness and dizziness.  - Pt found to have UTI. Nutritional history obtained from pt and pt's wife.  - Pt reports that his appetite has been poor recently. He has been eating because his wife encourages him. He reports his usual body weight as 184 lbs. Pt drinks boost at home (1-2/day).  - Pt with moderate fat and muscle depletion at the temples and clavicle.   Labs: CBGs: 108-123 Na WNL K and BUN WNL  Height: Ht Readings from Last 1 Encounters:  09/27/14 6' (1.829 m)    Weight: Wt Readings from Last 1 Encounters:  09/27/14 176 lb 2.4 oz (79.9 kg)    Ideal Body Weight: 77.6 kg  % Ideal Body Weight: 103%  Wt Readings from Last 10 Encounters:  09/27/14 176 lb 2.4 oz (79.9 kg)  06/08/14 182 lb 9.6 oz (82.827 kg)  04/20/14 189 lb 6.4 oz (85.911 kg)  01/14/14 186 lb 11.7 oz (84.7 kg)  09/30/13 197 lb (89.359 kg)  06/03/13 199 lb (90.266 kg)  11/05/11 206 lb (93.441 kg)    Usual Body Weight: 184 lbs  % Usual Body Weight: 96%  BMI:  Body mass index is 23.88 kg/(m^2).  Estimated Nutritional Needs: Kcal: 2000-2200 Protein: 110-125 g Fluid: 2.0-2.2 L/day  Skin: Intact  Diet Order: DIET DYS 3  EDUCATION NEEDS: -Education needs addressed   Intake/Output  Summary (Last 24 hours) at 09/27/14 1428 Last data filed at 09/27/14 1418  Gross per 24 hour  Intake    240 ml  Output    500 ml  Net   -260 ml    Last BM: prior to admission   Labs:   Recent Labs Lab 09/27/14 0150  NA 134*  K 3.9  CL 97  CO2 22  BUN 21  CREATININE 1.07  CALCIUM 9.7  GLUCOSE 107*    CBG (last 3)   Recent Labs  09/27/14 0153 09/27/14 0746 09/27/14 1218  GLUCAP 108* 115* 123*    Scheduled Meds: . carvedilol  3.125 mg Oral BID WC  . cefTRIAXone (ROCEPHIN)  IV  1 g Intravenous Q24H  . docusate sodium  100 mg Oral TID  . dronedarone  400 mg Oral BID WC  . insulin aspart  0-9 Units Subcutaneous TID WC  . levothyroxine  50 mcg Oral QAC breakfast  . loratadine  10 mg Oral Daily  . polyethylene glycol  17 g Oral Daily  . pravastatin  40 mg Oral q1800  . sodium chloride  3 mL Intravenous Q12H  . [START ON 09/28/2014] warfarin  2.5 mg Oral Q M,W,F-1800  . warfarin  5 mg Oral Q T,Th,S,Su-1800  . Warfarin - Pharmacist Dosing Inpatient   Does not apply q1800    Continuous Infusions: . sodium chloride 100 mL/hr at 09/27/14 0516    Past Medical History  Diagnosis Date  .  Shortness of breath   . Hypothyroidism   . Hypertension   . Dysrhythmia     atrial fibrilation  . Arthritis   . Diabetes mellitus     type 2  . Insomnia, unspecified   . Acute bronchitis   . Other and unspecified hyperlipidemia   . Unspecified hearing loss   . Insomnia, unspecified   . Cataract     Past Surgical History  Procedure Laterality Date  . Back surgery    . Colon surgery      2001  . Cardioversion  11/06/2011    Procedure: CARDIOVERSION;  Surgeon: Chrystie NoseKenneth C. Hilty;  Location: [facility] OR;  Service: Cardiovascular;  Laterality: N/A;  . Colectomy  2001    Dr. Areta HaberStreck    Quierra Silverio MS, RD, LDN

## 2014-09-27 NOTE — Plan of Care (Signed)
Problem: Phase I Progression Outcomes Goal: Pain controlled with appropriate interventions Outcome: Completed/Met Date Met:  09/27/14 Goal: OOB as tolerated unless otherwise ordered Outcome: Progressing

## 2014-09-27 NOTE — Evaluation (Signed)
Clinical/Bedside Swallow Evaluation Patient Details  Name: Ronald Payne MRN: [id number] Date of Birth: 09-30-1923  Today's Date: 09/27/2014 Time: 0910-0932 SLP Time Calculation (min) (ACUTE ONLY): 22 min  Past Medical History:  Past Medical History  Diagnosis Date  . Shortness of breath   . Hypothyroidism   . Hypertension   . Dysrhythmia     atrial fibrilation  . Arthritis   . Diabetes mellitus     type 2  . Insomnia, unspecified   . Acute bronchitis   . Other and unspecified hyperlipidemia   . Unspecified hearing loss   . Insomnia, unspecified   . Cataract    Past Surgical History:  Past Surgical History  Procedure Laterality Date  . Back surgery    . Colon surgery      2001  . Cardioversion  11/06/2011    Procedure: CARDIOVERSION;  Surgeon: Chrystie NoseKenneth C. Hilty;  Location: [facility] OR;  Service: Cardiovascular;  Laterality: N/A;  . Colectomy  2001    Dr. Jamey Payne   HPI:  80 y.o. male with past medical history of diabetes mellitus, hypothyroidism, hyperlipidemia, A. fib, hypertension, BPH, acute bronchitis, SOB admitted with burning on urination, fever, generalized weakness and dizziness. Pt reports that he fell and bruised his right eye one weak ago. Pt also reports that he has a constant productive cough with dark sputum and possible aspiration per his wife. Found to have positive UA for UTI. CXR negative for acute abnormalities.  MBS 02/18/14 aspiration thins recommended Dys 3, nectar.  MBS 04/05/14 decreased pharyngeal residue, possible osteophyte with penetration to vocal cords w/ nectar with volitional throat clear to clear vestibule with recommendation of Dys 3, nectar, hard cough.   Assessment / Plan / Recommendation Clinical Impression  Pt states he consumed nectar liquids [facility] (sometimes "drink thin Coke and water"). No indications of pharyngeal deficits with nectar thick or solid during the assessment. Mastication functional with  solid, however he reports he doesn't eat salads or hard meats."  Verbal cues to cough/clear throat after every other sip as recomended previously (visual cues with sign in front of pt). Hard, consistent coughing noted at the cessation of evaluation with suspicion of silent aspiration during the swallow or from possible pharyngeal residue.  SLP recommends Dys 3 texture and continue nectar liquids, pills whole in applesauce, cough/throat clear after every other sip, no straws. SLP will continue treatment.             Aspiration Risk  Moderate    Diet Recommendation Dysphagia 3 (Mechanical Soft);Nectar-thick liquid   Liquid Administration via: Cup;No straw Medication Administration: Whole meds with puree Supervision: Patient able to self feed;Intermittent supervision to cue for compensatory strategies Compensations: Slow rate;Small sips/bites;Clear throat after each swallow Postural Changes and/or Swallow Maneuvers: Seated upright 90 degrees;Upright 30-60 min after meal    Other  Recommendations Oral Care Recommendations: Oral care BID   Follow Up Recommendations   (TBD)    Frequency and Duration min 2x/week  2 weeks   Pertinent Vitals/Pain No pain         Swallow Study Prior Functional Status  Type of Home: House Available Help at Discharge: Family;Available 24 hours/day       Oral/Motor/Sensory Function Overall Oral Motor/Sensory Function: Appears within functional limits for tasks assessed   Ice Chips Ice chips: Not tested   Thin Liquid Thin Liquid: Not tested (on nectar thick at baseline)    Nectar Thick Nectar Thick Liquid: Within  functional limits Presentation: Cup   Honey Thick Honey Thick Liquid: Not tested   Puree Puree: Within functional limits   Solid   GO    Solid: Within functional limits       Royce Macadamia 09/27/2014,11:54 AM  Breck Coons Lonell Face.Ed [employer] [phone]

## 2014-09-27 NOTE — Evaluation (Signed)
Physical Therapy Evaluation Patient Details Name: Ronald PrettyClarence A Eager MRN: [id number] DOB: 1923-08-03 Today's Date: 09/27/2014   History of Present Illness  Pt admit with a UTI.  Also has afib.  Clinical Impression  Pt admitted with above. Pt currently with functional limitations due to the deficits listed below (see PT Problem List).  Pt has plenty of help at home and has equipment.  Wife provides pt assist at home per grandsons.  HHPT appropriate.    Pt will benefit from skilled PT to increase their independence and safety with mobility to allow discharge to the venue listed below.     Follow Up Recommendations Home health PT;Supervision/Assistance - 24 hour    Equipment Recommendations  None recommended by PT    Recommendations for Other Services       Precautions / Restrictions Precautions Precautions: Fall Restrictions Weight Bearing Restrictions: No      Mobility  Bed Mobility Overal bed mobility: Needs Assistance Bed Mobility: Supine to Sit     Supine to sit: Min assist     General bed mobility comments: Pt needed a little asssit with elevation of trunk.  Slow to move.    Transfers Overall transfer level: Needs assistance Equipment used: Rolling walker (2 wheeled) Transfers: Sit to/from Stand Sit to Stand: Min assist;From elevated surface         General transfer comment: Cued pt to push up from bed however pt pulled up on RW.  has lift chair at home per family.  Took incr time to get stability once on feet.    Ambulation/Gait Ambulation/Gait assistance: Min assist Ambulation Distance (Feet): 150 Feet Assistive device: Rolling walker (2 wheeled) Gait Pattern/deviations: Step-through pattern;Decreased stride length;Decreased step length - right;Decreased step length - left;Drifts right/left;Trunk flexed   Gait velocity interpretation: Below normal speed for age/gender General Gait Details: Pt drifted right and left but was able to steer RW on own.  Would  roll into objects but could maneuver RW around without asssit and without help to move device.  Pt needed cues for flexed posture as he stays flexed.  Pt able to ambulate with RW with good overall balance and just min guard asssit which family states his wife can provide.   Stairs            Wheelchair Mobility    Modified Rankin (Stroke Patients Only)       Balance Overall balance assessment: Needs assistance;History of Falls         Standing balance support: Bilateral upper extremity supported;During functional activity Standing balance-Leahy Scale: Poor Standing balance comment: Relies on RW for balance in standing.                              Pertinent Vitals/Pain Pain Assessment: No/denies pain  VSS    Home Living Family/patient expects to be discharged to:: Private residence Living Arrangements: Spouse/significant other Available Help at Discharge: Family;Available 24 hours/day Type of Home: House Home Access: Stairs to enter Entrance Stairs-Rails: None Entrance Stairs-Number of Steps: 2 Home Layout: One level Home Equipment: Walker - 2 wheels;Grab bars - toilet (lift chair, grab bar at toilet ) Additional Comments: Wife assists husband    Prior Function Level of Independence: Independent with assistive device(s);Needs assistance   Gait / Transfers Assistance Needed: Could walk short distances with RW by himself.  ADL's / Homemaking Assistance Needed: total assist by wife to bathe  Hand Dominance   Dominant Hand: Right    Extremity/Trunk Assessment   Upper Extremity Assessment: Defer to OT evaluation           Lower Extremity Assessment: Generalized weakness      Cervical / Trunk Assessment: Kyphotic  Communication   Communication: HOH  Cognition Arousal/Alertness: Awake/alert Behavior During Therapy: WFL for tasks assessed/performed Overall Cognitive Status: Within Functional Limits for tasks assessed                       General Comments      Exercises General Exercises - Lower Extremity Ankle Circles/Pumps: AROM;Both;10 reps;Seated Long Arc Quad: AROM;Both;10 reps;Seated      Assessment/Plan    PT Assessment Patient needs continued PT services  PT Diagnosis Generalized weakness   PT Problem List Decreased activity tolerance;Decreased balance;Decreased mobility;Decreased knowledge of use of DME;Decreased safety awareness;Decreased knowledge of precautions  PT Treatment Interventions DME instruction;Gait training;Functional mobility training;Therapeutic activities;Therapeutic exercise;Balance training;Patient/family education   PT Goals (Current goals can be found in the Care Plan section) Acute Rehab PT Goals Patient Stated Goal: to go home PT Goal Formulation: With patient Time For Goal Achievement: 10/11/14 Potential to Achieve Goals: Good    Frequency Min 3X/week   Barriers to discharge        Co-evaluation               End of Session Equipment Utilized During Treatment: Gait belt;Oxygen Activity Tolerance: Patient limited by fatigue Patient left: in chair;with call bell/phone within reach;with family/visitor present;with chair alarm set (family refused chair alarm saying they would always stay ) Nurse Communication: Mobility status         Time: 3762-8315 PT Time Calculation (min) (ACUTE ONLY): 35 min   Charges:   PT Evaluation $Initial PT Evaluation Tier I: 1 Procedure PT Treatments $Gait Training: 8-22 mins $Therapeutic Exercise: 8-22 mins   PT G CodesTawni Millers F 22-Oct-2014, 11:44 AM Javon Hupfer,PT Acute Rehabilitation [phone] (pager)

## 2014-09-27 NOTE — ED Notes (Signed)
Per ems, the patient attempted to stand to use the restroom and felt extremely weak. Recent fall with bruise to right eye last week.  Orthostatic vs with ems: laying bp 132/56, p 92, sitting bp 118/72, p 96. cbg 119. Normal sinus with occasional pvc's. Temp of 100.2.  92% on room air. Patient is alert and oriented on arrival.

## 2014-09-27 NOTE — Progress Notes (Signed)
Pt admitted to the unit. Pt is alert and oriented. Pt oriented to room, staff, and call bell. Bed in lowest position. Full assessment to Epic. Call bell with in reach. Told to call for assists. Will continue to monitor.  Joory Gough E  

## 2014-09-27 NOTE — Progress Notes (Signed)
Utilization review completed.  

## 2014-09-27 NOTE — ED Notes (Signed)
Dr Niu at the bedside. 

## 2014-09-27 NOTE — Plan of Care (Signed)
Problem: Phase I Progression Outcomes Goal: Vital Signs stable- temperature less than 102 Outcome: Completed/Met Date Met:  09/27/14 Goal: Initial discharge plan identified Outcome: Completed/Met Date Met:  09/27/14 Goal: Voiding-avoid urinary catheter unless indicated Outcome: Completed/Met Date Met:  09/27/14

## 2014-09-27 NOTE — Progress Notes (Signed)
Report received from ED Nurse

## 2014-09-27 NOTE — ED Notes (Signed)
Patient transported to CT 

## 2014-09-27 NOTE — Progress Notes (Signed)
Patient admitted after midnight.  Please see H&P: UTI and sepsis secondary to UTI: patient's symptoms of generalized weakness, dizziness, fever, burning on urination are most consistent with UTI. This is considered complicated UTI as patient has to self catheterize 5 times a day. Patient has mild sepsis with soft blood pressure on admission. CT-head negative for acute abnormalities.  -admit to tele bed give A fib -rocephin -Adjust antibiotics according to the urine culture results. -Blood cultures x 2 -IVF: 1L NS bolus followed by 100 cc/h (We'll be very careful with IV fluids as patient has history of nonischemic cardiomyopathy). - decrease coreg dose from 6.25 to 3.125 bid for A fib. May need to d/c if bp cannot be maintained well.   Diabetes mellitus type 2: A1c was 6.2 on 01/14/14. On metformin at home -Switch metformin to Insulin sliding scale while in the hospital  Atrial fibrillation: His heart rate is between 110-120 on admission. On Coumadin, carvedilol and Multaq at home. INR 2.06 -Continue Coumadin per pharmacy -Decrease the dose of Coreg because of her soft blood pressure on admission -continue Multaq  Cough: Patient reported that he has chronic cough, with possible aspiration. Chest x-ray is negative. -Swallowing test- MBS in June- DYS 3 with nectar thick  [name] DO

## 2014-09-27 NOTE — ED Notes (Signed)
Dr. Clyde Lundborg aware of low bp and heart rate over 100, discussed with family the plan of care.

## 2014-09-27 NOTE — Progress Notes (Signed)
ANTICOAGULATION CONSULT NOTE - Initial Consult  Pharmacy Consult for Coumadin Indication: atrial fibrillation  Allergies  Allergen Reactions  . Amiodarone Other (See Comments)    Hyperthyroid on Amio in the past, changed to Multaq    Patient Measurements: Height: 6' (182.9 cm) Weight: 186 lb (84.369 kg) IBW/kg (Calculated) : 77.6  Vital Signs: Temp: 98.5 F (36.9 C) (12/01 0541) Temp Source: Oral (12/01 0541) BP: 88/48 mmHg (12/01 0541) Pulse Rate: 98 (12/01 0541)  Labs:  Recent Labs  09/27/14 0150  HGB 10.2*  HCT 30.7*  PLT 177  LABPROT 23.4*  INR 2.06*  CREATININE 1.07    Estimated Creatinine Clearance: 51.4 mL/min (by C-G formula based on Cr of 1.07).   Medical History: Past Medical History  Diagnosis Date  . Shortness of breath   . Hypothyroidism   . Hypertension   . Dysrhythmia     atrial fibrilation  . Arthritis   . Diabetes mellitus     type 2  . Insomnia, unspecified   . Acute bronchitis   . Other and unspecified hyperlipidemia   . Unspecified hearing loss   . Insomnia, unspecified   . Cataract     Medications:  Prescriptions prior to admission  Medication Sig Dispense Refill Last Dose  . benzonatate (TESSALON) 100 MG capsule Take 100 mg by mouth 3 (three) times daily as needed for cough.   Past Week at Unknown time  . carvedilol (COREG) 6.25 MG tablet Take 6.25 mg by mouth 2 (two) times daily with a meal.   09/26/2014 at 1700  . cetirizine (ZYRTEC) 10 MG tablet Take 10 mg by mouth daily. Take 1 tablet daily for allergies.   09/26/2014 at Unknown time  . docusate sodium (COLACE) 100 MG capsule Take 100 mg by mouth 3 (three) times daily. For constipation   09/26/2014 at Unknown time  . dronedarone (MULTAQ) 400 MG tablet Take 1 tablet (400 mg total) by mouth 2 (two) times daily with a meal. 180 tablet 3 09/26/2014 at Unknown time  . fluticasone (FLONASE) 50 MCG/ACT nasal spray Place 1 spray into both nostrils daily as needed for allergies or  rhinitis.   09/26/2014 at Unknown time  . Glucosamine-Chondroitin (COSAMIN DS PO) Take 1 tablet by mouth 2 (two) times daily.     09/26/2014 at Unknown time  . HYDROcodone-acetaminophen (NORCO/VICODIN) 5-325 MG per tablet Take 1 tablet by mouth every 6 (six) hours as needed for moderate pain.   Past Month at Unknown time  . levothyroxine (SYNTHROID, LEVOTHROID) 50 MCG tablet Take 50 mcg by mouth daily.     09/26/2014 at Unknown time  . lovastatin (MEVACOR) 40 MG tablet Take 40 mg by mouth at bedtime. Take 40 mg daily to control cholesterol.   09/26/2014 at Unknown time  . metFORMIN (GLUCOPHAGE) 500 MG tablet Take 500 mg by mouth 2 (two) times daily with a meal. Take 1 tablet twice daily for blood sugar.   09/26/2014 at Unknown time  . polyethylene glycol (MIRALAX / GLYCOLAX) packet Take 17 g by mouth daily.   09/26/2014 at Unknown time  . warfarin (COUMADIN) 5 MG tablet Take 2.5-5 mg by mouth daily. Take 2.5mg  on Monday, Wednesday and Friday , All other days including Sunday take 5mg    09/26/2014 at Unknown time    Assessment: 80 yo male admitted with UTI, h/o Afib, to continue Coumadin Goal of Therapy:  INR 2-3 Monitor platelets by anticoagulation protocol: Yes   Plan:  Continue home regimen Daily INR  Arthur Aydelotte, Gary FleetGregory Vernon 09/27/2014,5:51 AM

## 2014-09-27 NOTE — ED Notes (Signed)
Dr. Yelverton at the bedside.  

## 2014-09-27 NOTE — H&P (Signed)
[facility] History and Physical  Ronald PrettyClarence A Payne JYN:[id number] DOB: January 07, 1923 DOA: 09/27/2014  Referring physician: ED physician PCP: Ronald DialsBOUSKA,DAVID E, MD  Specialists:   Chief Complaint: Burning on urination, fever, generalized weakness and dizziness  HPI: Ronald Payne is a 80 y.o. male with past medical history of diabetes mellitus, hypothyroidism, hyperlipidemia, A. fib on Coumadin, hypertension, BPH, who presents with burning on urination, fever, generalized weakness and dizziness.  Patient reports that he is routinely doing in & out catheterization. In the past several days, he has been having burning on urination, but no dysuria. He feels weak and developed fever. In the yesterday morning, he had dizziness and becomes weaker and lightheaded. Pt reports that he fell and bruised his right eye one weak ago. Pt also reports that he has a constant productive cough with dark sputum and possible aspiration per his wife. Pt denies nausea, vomiting, or diarrhea, unilateral weakness or numbness in his extremities. He has chronic constipation.   Work up in the ED demonstrates positive UA for UTI, fever 100.4, lactate 0.99, INR 2.06. CT-head and CXR negative for acute abnormalities.  Review of Systems: As presented in the history of presenting illness, rest negative.  Where does patient live?  At home Can patient participate in ADLs? barely  Allergy:  Allergies  Allergen Reactions  . Amiodarone Other (See Comments)    Hyperthyroid on Amio in the past, changed to Multaq    Past Medical History  Diagnosis Date  . Shortness of breath   . Hypothyroidism   . Hypertension   . Dysrhythmia     atrial fibrilation  . Arthritis   . Diabetes mellitus     type 2  . Insomnia, unspecified   . Acute bronchitis   . Other and unspecified hyperlipidemia   . Unspecified hearing loss   . Insomnia, unspecified   . Cataract     Past Surgical History  Procedure Laterality Date  . Back  surgery    . Colon surgery      2001  . Cardioversion  11/06/2011    Procedure: CARDIOVERSION;  Surgeon: Chrystie NoseKenneth C. Hilty;  Location: [facility] OR;  Service: Cardiovascular;  Laterality: N/A;  . Colectomy  2001    Dr. [name]    Social History:  reports that he quit smoking about 20 years ago. He does not have any smokeless tobacco history on file. He reports that he does not drink alcohol or use illicit drugs.  Family History:  Family History  Problem Relation Age of Onset  . Heart disease Mother      Prior to Admission medications   Medication Sig Start Date End Date Taking? Authorizing Provider  benzonatate (TESSALON) 100 MG capsule Take 100 mg by mouth 3 (three) times daily as needed for cough.   Yes Historical Provider, MD  carvedilol (COREG) 6.25 MG tablet Take 6.25 mg by mouth 2 (two) times daily with a meal.   Yes Historical Provider, MD  cetirizine (ZYRTEC) 10 MG tablet Take 10 mg by mouth daily. Take 1 tablet daily for allergies.   Yes Historical Provider, MD  docusate sodium (COLACE) 100 MG capsule Take 100 mg by mouth 3 (three) times daily. For constipation   Yes Historical Provider, MD  dronedarone (MULTAQ) 400 MG tablet Take 1 tablet (400 mg total) by mouth 2 (two) times daily with a meal. 11/11/13  Yes Mihai Croitoru, MD  fluticasone (FLONASE) 50 MCG/ACT nasal spray Place 1 spray into both nostrils daily as needed for  allergies or rhinitis.   Yes Historical Provider, MD  Glucosamine-Chondroitin (COSAMIN DS PO) Take 1 tablet by mouth 2 (two) times daily.     Yes Historical Provider, MD  HYDROcodone-acetaminophen (NORCO/VICODIN) 5-325 MG per tablet Take 1 tablet by mouth every 6 (six) hours as needed for moderate pain.   Yes Historical Provider, MD  levothyroxine (SYNTHROID, LEVOTHROID) 50 MCG tablet Take 50 mcg by mouth daily.     Yes Historical Provider, MD  lovastatin (MEVACOR) 40 MG tablet Take 40 mg by mouth at bedtime. Take 40 mg daily to control cholesterol.   Yes Historical  Provider, MD  metFORMIN (GLUCOPHAGE) 500 MG tablet Take 500 mg by mouth 2 (two) times daily with a meal. Take 1 tablet twice daily for blood sugar.   Yes Historical Provider, MD  polyethylene glycol (MIRALAX / GLYCOLAX) packet Take 17 g by mouth daily.   Yes Historical Provider, MD  warfarin (COUMADIN) 5 MG tablet Take 2.5-5 mg by mouth daily. Take 2.5mg  on Monday, Wednesday and Friday , All other days including Sunday take 5mg    Yes Historical Provider, MD    Physical Exam: Filed Vitals:   09/27/14 0153 09/27/14 0200 09/27/14 0215 09/27/14 0230  BP: 118/66 111/65 107/79 92/59  Pulse: 90 92  110  Temp: 100.4 F (38 C)     TempSrc: Rectal     Resp: 25 24 20 13   Height: 6' (1.829 m)     Weight: 84.369 kg (186 lb)     SpO2: 97% 96% 100% 98%   General: Not in acute distress HEENT:Contusion to right periorbital region.         Eyes: PERRL, EOMI, no scleral icterus       ENT: No discharge from the ears and nose, no pharynx injection, no tonsillar enlargement.        Neck: No JVD, no bruit, no mass felt. Cardiac: S1/S2, RRR, tachycardia. No murmurs, No gallops or rubs Pulm: Good air movement bilaterally. Clear to auscultation bilaterally. No rales, wheezing, rhonchi or rubs. Abd: Soft, nondistended, nontender, no rebound pain, no organomegaly, BS present Ext: No edema bilaterally. 2+DP/PT pulse bilaterally Musculoskeletal: No joint deformities, erythema, or stiffness, ROM full Skin: No rashes.  Neuro: Alert and oriented X3, cranial nerves II-XII grossly intact, muscle strength 5/5 in all extremeties, sensation to light touch intact. Moves all extremities. Psych: Patient is not psychotic, no suicidal or hemocidal ideation.  Labs on Admission:  Basic Metabolic Panel:  Recent Labs Lab 09/27/14 0150  NA 134*  K 3.9  CL 97  CO2 22  GLUCOSE 107*  BUN 21  CREATININE 1.07  CALCIUM 9.7   Liver Function Tests: No results for input(s): AST, ALT, ALKPHOS, BILITOT, PROT, ALBUMIN in the  last 168 hours. No results for input(s): LIPASE, AMYLASE in the last 168 hours. No results for input(s): AMMONIA in the last 168 hours. CBC:  Recent Labs Lab 09/27/14 0150  WBC 9.4  HGB 10.2*  HCT 30.7*  MCV 89.2  PLT 177   Cardiac Enzymes: No results for input(s): CKTOTAL, CKMB, CKMBINDEX, TROPONINI in the last 168 hours.  BNP (last 3 results) No results for input(s): PROBNP in the last 8760 hours. CBG:  Recent Labs Lab 09/27/14 0153  GLUCAP 108*    Radiological Exams on Admission: Ct Head Wo Contrast  09/27/2014   CLINICAL DATA:  Severe weakness while attempting to stand, acute onset. Recent fall last week, with bruising about the right orbit. Initial encounter.  EXAM: CT HEAD  WITHOUT CONTRAST  TECHNIQUE: Contiguous axial images were obtained from the base of the skull through the vertex without intravenous contrast.  COMPARISON:  CT of the head performed 05/22/2012  FINDINGS: There is no evidence of acute infarction, mass lesion, or intra- or extra-axial hemorrhage on CT.  Prominence of the ventricles and sulci reflects mild cortical volume loss. Mild periventricular white matter change likely reflects small vessel ischemic microangiopathy. 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  The brainstem and fourth ventricle are within normal limits. The basal ganglia are unremarkable in appearance. No mass effect or midline shift is seen.  There is no evidence of fracture; visualized osseous structures are unremarkable in appearance. The orbits are within normal limits. Mild mucosal thickening is noted at the maxillary sinuses bilaterally. The remaining paranasal sinuses and mastoid air cells are well-aerated. No significant soft tissue abnormalities are seen.  IMPRESSION: 1. No acute intracranial pathology seen on CT. 2. Mild cortical volume loss and scattered small vessel ischemic microangiopathy. 3. 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 4. Mild mucosal thickening at the maxillary sinuses bilaterally.   Electronically Signed   By: Roanna Raider M.D.   On: 09/27/2014 03:52   Dg Chest Port 1 View  09/27/2014   CLINICAL DATA:  Shortness of breath.  Initial encounter.  EXAM: PORTABLE CHEST - 1 VIEW  COMPARISON:  01/15/2014 and prior chest radiographs  FINDINGS: Mild cardiomegaly again noted.  Mild bibasilar scarring again noted.  There is no evidence of focal airspace disease, pulmonary edema, suspicious pulmonary nodule/mass, pleural effusion, or pneumothorax. No acute bony abnormalities are identified.  IMPRESSION: No active disease.   Electronically Signed   By: Laveda Abbe M.D.   On: 09/27/2014 02:33    EKG: Independently reviewed. Currently sinus rhythm with QTc 475  Assessment/Plan Principal Problem:   UTI (lower urinary tract infection) Active Problems:   PAF (paroxysmal atrial fibrillation), recurrent symptomatic AF this admission   Chronic anticoagulation   Diabetes mellitus   HTN (hypertension), hypotensive in AF   Dyslipidemia   BPH (benign prostatic hyperplasia), Dr Vernie Ammons follows   Chronic renal insufficiency, stage III (moderate)  UTI ans sepsis secondary to UTI: patient's symptoms of generalized weakness, dizziness, fever, burning on urination are most consistent with UTI. This is considered complicated UTI as patient has to self catheterize 5 times a day. Patient has mild sepsis with soft blood pressure on admission. CT-head negative for acute abnormalities.  -admit to tele bed give A fib -Started on Rocephin in the emergency department, we'll continue.   -Adjust antibiotics according to the urine culture results. -Blood cultures x 2 -IVF: 1L NS bolus followed by 100 cc/h (We'll be very careful with IV fluids as patient has history of nonischemic cardiomyopathy). - decrease coreg dose from 6.25 to 3.125 bid for A fib. May need to d/c if bp cannot be maintained well.   Diabetes mellitus  type 2: A1c was 6.2 on 01/14/14. On metformin at home -Switch metformin to Insulin sliding scale.  Atrial fibrillation: His heart rate is between 110-120 on admission. On Coumadin, carvedilol and Multaq at home. INR 2.06 -Continue Coumadin per pharmacy -Decrease the dose of Coreg because of her soft blood pressure on admission -continue Multaq  Cough: Patient reported that he has chronic cough, with possible aspiration. Chest x-ray is negative. -Swallowing test. Patient may need dysphagia diet.   DVT ppx: Coumadin  Code Status: Full code. Family Communication:  Yes, patient's wife and daughter  at bed side Disposition Plan: Admit to inpatient   Date of Service 09/27/2014    Lorretta Harp [facility] Pager [phone]  If 7PM-7AM, please contact night-coverage [email or web address] Password [facility] 09/27/2014, 4:47 AM

## 2014-09-27 NOTE — Progress Notes (Signed)
Pt had 8 beat run of vtach, no s/s of cp or sob. MD Benjamine Mola notified.

## 2014-09-27 NOTE — ED Notes (Signed)
cbg is 108.

## 2014-09-27 NOTE — ED Provider Notes (Signed)
CSN: [id number]     Arrival date & time 09/27/14  0141 History   This chart was scribed for Loren Raceravid Omarr Hann, MD by Murriel HopperAlec Bankhead, ED Scribe. This patient was seen in room B17C/B17C and the patient's care was started at 2:06 AM.    Chief Complaint  Patient presents with  . Near Syncope      The history is provided by the patient. No language interpreter was used.     HPI Comments: Ronald Payne is a 80 y.o. male who presents to the Emergency Department complaining of a near-syncopal episode with associated weakness and dizziness that occurred earlier this morning. Pt states that he attempted to stand to use the restroom and felt very weak and lightheaded. Pt reports that he fell and bruised his right eye a couple of days ago, and saw his PCP shortly afterwards. Pt also reports that he has a constant productive cough with dark sputum. Pt denies nausea, vomiting, or diarrhea.   Past Medical History  Diagnosis Date  . Shortness of breath   . Hypothyroidism   . Hypertension   . Dysrhythmia     atrial fibrilation  . Arthritis   . Diabetes mellitus     type 2  . Insomnia, unspecified   . Acute bronchitis   . Other and unspecified hyperlipidemia   . Unspecified hearing loss   . Insomnia, unspecified   . Cataract    Past Surgical History  Procedure Laterality Date  . Back surgery    . Colon surgery      2001  . Cardioversion  11/06/2011    Procedure: CARDIOVERSION;  Surgeon: Chrystie NoseKenneth C. Hilty;  Location: [facility] OR;  Service: Cardiovascular;  Laterality: N/A;  . Colectomy  2001    Dr. [name]   Family History  Problem Relation Age of Onset  . Heart disease Mother    History  Substance Use Topics  . Smoking status: Former Smoker    Quit date: 01/14/1994  . Smokeless tobacco: Not on file     Comment: quit years ago "  . Alcohol Use: No    Review of Systems  Constitutional: Positive for fever, chills and fatigue.  Respiratory: Positive for cough and shortness of breath.    Cardiovascular: Negative for chest pain, palpitations and leg swelling.  Gastrointestinal: Negative for nausea, abdominal pain, diarrhea and constipation.  Genitourinary: Negative for dysuria and difficulty urinating.  Musculoskeletal: Negative for back pain, neck pain and neck stiffness.  Skin: Negative for rash and wound.  Neurological: Positive for dizziness, weakness (generalized) and light-headedness. Negative for syncope, numbness and headaches.  All other systems reviewed and are negative.     Allergies  Amiodarone  Home Medications   Prior to Admission medications   Medication Sig Start Date End Date Taking? Authorizing Provider  benzonatate (TESSALON) 100 MG capsule Take 100 mg by mouth 3 (three) times daily as needed for cough.   Yes Historical Provider, MD  carvedilol (COREG) 6.25 MG tablet Take 6.25 mg by mouth 2 (two) times daily with a meal.   Yes Historical Provider, MD  cetirizine (ZYRTEC) 10 MG tablet Take 10 mg by mouth daily. Take 1 tablet daily for allergies.   Yes Historical Provider, MD  docusate sodium (COLACE) 100 MG capsule Take 100 mg by mouth 3 (three) times daily. For constipation   Yes Historical Provider, MD  dronedarone (MULTAQ) 400 MG tablet Take 1 tablet (400 mg total) by mouth 2 (two) times daily with a meal. 11/11/13  Yes Mihai Croitoru, MD  fluticasone (FLONASE) 50 MCG/ACT nasal spray Place 1 spray into both nostrils daily as needed for allergies or rhinitis.   Yes Historical Provider, MD  Glucosamine-Chondroitin (COSAMIN DS PO) Take 1 tablet by mouth 2 (two) times daily.     Yes Historical Provider, MD  HYDROcodone-acetaminophen (NORCO/VICODIN) 5-325 MG per tablet Take 1 tablet by mouth every 6 (six) hours as needed for moderate pain.   Yes Historical Provider, MD  levothyroxine (SYNTHROID, LEVOTHROID) 50 MCG tablet Take 50 mcg by mouth daily.     Yes Historical Provider, MD  lovastatin (MEVACOR) 40 MG tablet Take 40 mg by mouth at bedtime. Take 40 mg  daily to control cholesterol.   Yes Historical Provider, MD  metFORMIN (GLUCOPHAGE) 500 MG tablet Take 500 mg by mouth 2 (two) times daily with a meal. Take 1 tablet twice daily for blood sugar.   Yes Historical Provider, MD  polyethylene glycol (MIRALAX / GLYCOLAX) packet Take 17 g by mouth daily.   Yes Historical Provider, MD  warfarin (COUMADIN) 5 MG tablet Take 2.5-5 mg by mouth daily. Take 2.5mg  on Monday, Wednesday and Friday , All other days including Sunday take 5mg    Yes Historical Provider, MD   BP 92/59 mmHg  Pulse 110  Temp(Src) 100.4 F (38 C) (Rectal)  Resp 13  Ht 6' (1.829 m)  Wt 186 lb (84.369 kg)  BMI 25.22 kg/m2  SpO2 98% Physical Exam  Constitutional: He is oriented to person, place, and time. He appears well-developed and well-nourished. No distress.  HENT:  Head: Normocephalic.  Mouth/Throat: Oropharynx is clear and moist.  Contusion to right periorbital region. Midface stable.  Eyes: EOM are normal. Pupils are equal, round, and reactive to light.  Neck: Normal range of motion. Neck supple.  No posterior midline cervical tenderness to palpation.  Cardiovascular: Normal rate and regular rhythm.  Exam reveals no gallop and no friction rub.   No murmur heard. Pulmonary/Chest: Effort normal. No respiratory distress. He has no wheezes. He has rales (few scattered Rales bilateral bases.). He exhibits no tenderness.  Abdominal: Soft. Bowel sounds are normal. He exhibits no distension and no mass. There is no tenderness. There is no rebound and no guarding.  Musculoskeletal: Normal range of motion. He exhibits no edema or tenderness.  No thoracic or lumbar tenderness with palpation. Pelvis is stable. No calf swelling or tenderness.  Neurological: He is alert and oriented to person, place, and time.  5/5 motor in all extremities. Sensation is intact.  Skin: Skin is warm and dry. No rash noted. No erythema.  Psychiatric: He has a normal mood and affect. His behavior is  normal.  Nursing note and vitals reviewed.   ED Course  Procedures (including critical care time)  DIAGNOSTIC STUDIES: Oxygen Saturation is 97% on [place], normal by my interpretation.    COORDINATION OF CARE: 2:09 AM Discussed treatment plan with pt at bedside and pt agreed to plan.   Labs Review Labs Reviewed  CBC - Abnormal; Notable for the following:    RBC 3.44 (*)    Hemoglobin 10.2 (*)    HCT 30.7 (*)    All other components within normal limits  BASIC METABOLIC PANEL - Abnormal; Notable for the following:    Sodium 134 (*)    Glucose, Bld 107 (*)    GFR calc non Af Amer 59 (*)    GFR calc Af Amer 69 (*)    All other components within normal limits  URINALYSIS, ROUTINE W REFLEX MICROSCOPIC - Abnormal; Notable for the following:    APPearance CLOUDY (*)    Leukocytes, UA TRACE (*)    All other components within normal limits  PROTIME-INR - Abnormal; Notable for the following:    Prothrombin Time 23.4 (*)    INR 2.06 (*)    All other components within normal limits  URINE MICROSCOPIC-ADD ON - Abnormal; Notable for the following:    Bacteria, UA MANY (*)    All other components within normal limits  CBG MONITORING, ED - Abnormal; Notable for the following:    Glucose-Capillary 108 (*)    All other components within normal limits  I-STAT CG4 LACTIC ACID, ED  I-STAT TROPOININ, ED    Imaging Review Ct Head Wo Contrast  09/27/2014   CLINICAL DATA:  Severe weakness while attempting to stand, acute onset. Recent fall last week, with bruising about the right orbit. Initial encounter.  EXAM: CT HEAD WITHOUT CONTRAST  TECHNIQUE: Contiguous axial images were obtained from the base of the skull through the vertex without intravenous contrast.  COMPARISON:  CT of the head performed 05/22/2012  FINDINGS: There is no evidence of acute infarction, mass lesion, or intra- or extra-axial hemorrhage on CT.  Prominence of the ventricles and sulci reflects mild cortical volume loss. Mild  periventricular white matter change likely reflects small vessel ischemic microangiopathy. 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  The brainstem and fourth ventricle are within normal limits. The basal ganglia are unremarkable in appearance. No mass effect or midline shift is seen.  There is no evidence of fracture; visualized osseous structures are unremarkable in appearance. The orbits are within normal limits. Mild mucosal thickening is noted at the maxillary sinuses bilaterally. The remaining paranasal sinuses and mastoid air cells are well-aerated. No significant soft tissue abnormalities are seen.  IMPRESSION: 1. No acute intracranial pathology seen on CT. 2. Mild cortical volume loss and scattered small vessel ischemic microangiopathy. 3. 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 4. Mild mucosal thickening at the maxillary sinuses bilaterally.   Electronically Signed   By: Roanna Raider M.D.   On: 09/27/2014 03:52   Dg Chest Port 1 View  09/27/2014   CLINICAL DATA:  Shortness of breath.  Initial encounter.  EXAM: PORTABLE CHEST - 1 VIEW  COMPARISON:  01/15/2014 and prior chest radiographs  FINDINGS: Mild cardiomegaly again noted.  Mild bibasilar scarring again noted.  There is no evidence of focal airspace disease, pulmonary edema, suspicious pulmonary nodule/mass, pleural effusion, or pneumothorax. No acute bony abnormalities are identified.  IMPRESSION: No active disease.   Electronically Signed   By: Laveda Abbe M.D.   On: 09/27/2014 02:33     EKG Interpretation None      Date: 09/27/2014  Rate: 93  Rhythm: normal sinus rhythm  QRS Axis: normal  Intervals: normal  ST/T Wave abnormalities: normal  Conduction Disutrbances:none  Narrative Interpretation:   Old EKG Reviewed: none available   MDM   Final diagnoses:  Fall  UTI (lower urinary tract infection)  Paroxysmal atrial fibrillation   I personally performed the  services described in this documentation, which was scribed in my presence. The recorded information has been reviewed and is accurate.  Patient with intermittent atrial fibrillation in the emergency department. CT head without any acute intracranial findings. Patient remains neurologically stable. UTI on workup. Given IV Rocephin in the emergency department. Discuss with Dr. Clyde Lundborg. Will admit to a  telemetry bed.    Loren Racer, MD 09/27/14 [phone]

## 2014-09-28 ENCOUNTER — Ambulatory Visit: Payer: Self-pay | Admitting: Pharmacist Clinician (PhC)/ Clinical Pharmacy Specialist

## 2014-09-28 DIAGNOSIS — I10 Essential (primary) hypertension: Secondary | ICD-10-CM

## 2014-09-28 LAB — GLUCOSE, CAPILLARY
GLUCOSE-CAPILLARY: 104 mg/dL — AB (ref 70–99)
GLUCOSE-CAPILLARY: 125 mg/dL — AB (ref 70–99)
Glucose-Capillary: 144 mg/dL — ABNORMAL HIGH (ref 70–99)

## 2014-09-28 LAB — COMPREHENSIVE METABOLIC PANEL
ALBUMIN: 3 g/dL — AB (ref 3.5–5.2)
ALT: 7 U/L (ref 0–53)
AST: 12 U/L (ref 0–37)
Alkaline Phosphatase: 58 U/L (ref 39–117)
Anion gap: 11 (ref 5–15)
BUN: 17 mg/dL (ref 6–23)
CALCIUM: 9.2 mg/dL (ref 8.4–10.5)
CO2: 22 mEq/L (ref 19–32)
CREATININE: 1.03 mg/dL (ref 0.50–1.35)
Chloride: 105 mEq/L (ref 96–112)
GFR calc Af Amer: 72 mL/min — ABNORMAL LOW (ref >90)
GFR calc non Af Amer: 62 mL/min — ABNORMAL LOW (ref >90)
Glucose, Bld: 114 mg/dL — ABNORMAL HIGH (ref 70–99)
Potassium: 4.2 mEq/L (ref 3.7–5.3)
SODIUM: 138 meq/L (ref 137–147)
Total Bilirubin: 0.2 mg/dL — ABNORMAL LOW (ref 0.3–1.2)
Total Protein: 6 g/dL (ref 6.0–8.3)

## 2014-09-28 LAB — CBC
HCT: 30.1 % — ABNORMAL LOW (ref 39.0–52.0)
HEMOGLOBIN: 9.8 g/dL — AB (ref 13.0–17.0)
MCH: 29.6 pg (ref 26.0–34.0)
MCHC: 32.6 g/dL (ref 30.0–36.0)
MCV: 90.9 fL (ref 78.0–100.0)
Platelets: 165 10*3/uL (ref 150–400)
RBC: 3.31 MIL/uL — AB (ref 4.22–5.81)
RDW: 14 % (ref 11.5–15.5)
WBC: 6.1 10*3/uL (ref 4.0–10.5)

## 2014-09-28 LAB — PROTIME-INR
INR: 2.11 — ABNORMAL HIGH (ref 0.00–1.49)
Prothrombin Time: 23.8 seconds — ABNORMAL HIGH (ref 11.6–15.2)

## 2014-09-28 LAB — MAGNESIUM: Magnesium: 1.9 mg/dL (ref 1.5–2.5)

## 2014-09-28 MED ORDER — CARVEDILOL 6.25 MG PO TABS
6.2500 mg | ORAL_TABLET | Freq: Two times a day (BID) | ORAL | Status: DC
Start: 2014-09-28 — End: 2014-09-29
  Administered 2014-09-28 – 2014-09-29 (×2): 6.25 mg via ORAL
  Filled 2014-09-28 (×4): qty 1

## 2014-09-28 NOTE — Progress Notes (Addendum)
Speech Language Pathology Treatment: Dysphagia  Patient Details Name: Ronald Payne MRN: [id number] DOB: 10-01-1923 Today's Date: 09/28/2014 Time: 3545-6256 SLP Time Calculation (min) (ACUTE ONLY): 15 min  Assessment / Plan / Recommendation Clinical Impression  Dysphagia intervention with wife present during breakfast.  Reviewed/educated pt and wife regarding bedside swallow assessment yesterday and recommendations.  Discussed pt's coughing episodes typically following meals, however he denies symptoms of reflux, indigestion, globus sensation or po regurgitation.  Suspect intermittent laryngeal penetration with sensation leading to coughing episodes, however (silent) esophageal difficulty may be present . Provided education for esophageal follow up if begins to experience symptoms. He is mostly non ambulatory and has never had pna per pt and wife. Pt required moderate verbal reminders to clear throat/cough following sips as well as no talking while masticating.  Wife is diligent in reminding pt to follow swallow precautions.  Wife reported discussing ST home health with MD once home which ST agrees with .     HPI HPI: 80 y.o. male with past medical history of diabetes mellitus, hypothyroidism, hyperlipidemia, A. fib, hypertension, BPH, acute bronchitis, SOB admitted with burning on urination, fever, generalized weakness and dizziness. Pt reports that he fell and bruised his right eye one weak ago. Pt also reports that he has a constant productive cough with dark sputum and possible aspiration per his wife. Found to have positive UA for UTI. CXR negative for acute abnormalities.  MBS 02/18/14 aspiration thins recommended Dys 3, nectar.  MBS 04/05/14 decreased pharyngeal residue, possible osteophyte with penetration to vocal cords w/ nectar with volitional throat clear to clear vestibule with recommendation of Dys 3, nectar, hard cough.   Pertinent Vitals Pain Assessment: No/denies pain  SLP Plan   Continue with current plan of care    Recommendations Diet recommendations: Dysphagia 3 (mechanical soft);Nectar-thick liquid Liquids provided via: Cup;No straw Medication Administration: Whole meds with puree Supervision: Patient able to self feed;Full supervision/cueing for compensatory strategies Compensations: Slow rate;Small sips/bites;Clear throat after each swallow Postural Changes and/or Swallow Maneuvers: Seated upright 90 degrees;Upright 30-60 min after meal              Oral Care Recommendations: Oral care BID Follow up Recommendations: Home health SLP Plan: Continue with current plan of care         Royce Macadamia 09/28/2014, 9:52 AM  Breck Coons Lonell Face.Ed [employer] [phone]

## 2014-09-28 NOTE — Progress Notes (Signed)
ANTICOAGULATION CONSULT NOTE   Pharmacy Consult for Coumadin Indication: atrial fibrillation  Allergies  Allergen Reactions  . Amiodarone Other (See Comments)    Hyperthyroid on Amio in the past, changed to Multaq    Patient Measurements: Height: 6' (182.9 cm) Weight: 182 lb 1.6 oz (82.6 kg) IBW/kg (Calculated) : 77.6  Vital Signs: Temp: 97.7 F (36.5 C) (12/02 0437) Temp Source: Oral (12/02 0437) BP: 148/93 mmHg (12/02 0437) Pulse Rate: 103 (12/02 0437)  Labs:  Recent Labs  09/27/14 0150 09/28/14 0557  HGB 10.2* 9.8*  HCT 30.7* 30.1*  PLT 177 165  LABPROT 23.4* 23.8*  INR 2.06* 2.11*  CREATININE 1.07 1.03    Estimated Creatinine Clearance: 53.4 mL/min (by C-G formula based on Cr of 1.03).   Medical History: Past Medical History  Diagnosis Date  . Shortness of breath   . Hypothyroidism   . Hypertension   . Dysrhythmia     atrial fibrilation  . Arthritis   . Diabetes mellitus     type 2  . Insomnia, unspecified   . Acute bronchitis   . Other and unspecified hyperlipidemia   . Unspecified hearing loss   . Insomnia, unspecified   . Cataract     Medications:  Prescriptions prior to admission  Medication Sig Dispense Refill Last Dose  . benzonatate (TESSALON) 100 MG capsule Take 100 mg by mouth 3 (three) times daily as needed for cough.   Past Week at Unknown time  . carvedilol (COREG) 6.25 MG tablet Take 6.25 mg by mouth 2 (two) times daily with a meal.   09/26/2014 at 1700  . cetirizine (ZYRTEC) 10 MG tablet Take 10 mg by mouth daily. Take 1 tablet daily for allergies.   09/26/2014 at Unknown time  . docusate sodium (COLACE) 100 MG capsule Take 100 mg by mouth 3 (three) times daily. For constipation   09/26/2014 at Unknown time  . dronedarone (MULTAQ) 400 MG tablet Take 1 tablet (400 mg total) by mouth 2 (two) times daily with a meal. 180 tablet 3 09/26/2014 at Unknown time  . fluticasone (FLONASE) 50 MCG/ACT nasal spray Place 1 spray into both nostrils  daily as needed for allergies or rhinitis.   09/26/2014 at Unknown time  . Glucosamine-Chondroitin (COSAMIN DS PO) Take 1 tablet by mouth 2 (two) times daily.     09/26/2014 at Unknown time  . HYDROcodone-acetaminophen (NORCO/VICODIN) 5-325 MG per tablet Take 1 tablet by mouth every 6 (six) hours as needed for moderate pain.   Past Month at Unknown time  . levothyroxine (SYNTHROID, LEVOTHROID) 50 MCG tablet Take 50 mcg by mouth daily.     09/26/2014 at Unknown time  . lovastatin (MEVACOR) 40 MG tablet Take 40 mg by mouth at bedtime. Take 40 mg daily to control cholesterol.   09/26/2014 at Unknown time  . metFORMIN (GLUCOPHAGE) 500 MG tablet Take 500 mg by mouth 2 (two) times daily with a meal. Take 1 tablet twice daily for blood sugar.   09/26/2014 at Unknown time  . polyethylene glycol (MIRALAX / GLYCOLAX) packet Take 17 g by mouth daily.   09/26/2014 at Unknown time  . warfarin (COUMADIN) 5 MG tablet Take 2.5-5 mg by mouth daily. Take 2.5mg  on Monday, Wednesday and Friday , All other days including Sunday take 5mg    09/26/2014 at Unknown time    Assessment: 80 yo male admitted with UTI, h/o Afib, to continue Coumadin  INR remains therapeutic on home regimen Goal of Therapy:  INR 2-3 Monitor  platelets by anticoagulation protocol: Yes   Plan:  Continue home regimen Daily INR  Abner Ardis Poteet 09/28/2014,1:29 PM

## 2014-09-28 NOTE — Progress Notes (Signed)
MD Schorr paged re: pt run 21 beats SVT

## 2014-09-28 NOTE — Progress Notes (Signed)
PROGRESS NOTE  Ronald Payne OIL:[id number] DOB: 1923/04/06 DOA: 09/27/2014 PCP: Aura Dials, MD  Assessment/Plan: UTI and sepsis secondary to UTI: patient's symptoms of generalized weakness, dizziness, fever, burning on urination are most consistent with UTI. This is considered complicated UTI as patient has to self catheterize 5 times a day.  -rocephin -Adjust antibiotics according to the urine culture results. -Blood cultures x 2 -increase coreg and d/c IVF  Diabetes mellitus type 2: A1c was 6.2 on 01/14/14. On metformin at home -Switch metformin to Insulin sliding scale while in the hospital  Atrial fibrillation:  -continue Multaq -coreg -coumadin per pharmacy  Cough: Patient reported that he has chronic cough, with possible aspiration. Chest x-ray is negative. -Swallowing test- MBS in June- DYS 3 with nectar thick  Code Status: DNR- spoke with wife- says patient would want a natural death Family Communication: wife Disposition Plan:    Consultants:    Procedures:     HPI/Subjective: Wants to go home No SOB, no CP  Objective: Filed Vitals:   09/28/14 0437  BP: 148/93  Pulse: 103  Temp: 97.7 F (36.5 C)  Resp: 20    Intake/Output Summary (Last 24 hours) at 09/28/14 0944 Last data filed at 09/28/14 0443  Gross per 24 hour  Intake    480 ml  Output   2150 ml  Net  -1670 ml   Filed Weights   09/27/14 0153 09/27/14 0536 09/28/14 0441  Weight: 84.369 kg (186 lb) 79.9 kg (176 lb 2.4 oz) 82.6 kg (182 lb 1.6 oz)    Exam:   General:  Pleasant/cooperative  Cardiovascular: rrr  Respiratory: clear  Abdomen: +BS, soft  Musculoskeletal: moves all 4 ext   Data Reviewed: Basic Metabolic Panel:  Recent Labs Lab 09/27/14 0150 09/28/14 0557  NA 134* 138  K 3.9 4.2  CL 97 105  CO2 22 22  GLUCOSE 107* 114*  BUN 21 17  CREATININE 1.07 1.03  CALCIUM 9.7 9.2  MG  --  1.9   Liver Function Tests:  Recent Labs Lab 09/28/14 0557  AST 12    ALT 7  ALKPHOS 58  BILITOT 0.2*  PROT 6.0  ALBUMIN 3.0*   No results for input(s): LIPASE, AMYLASE in the last 168 hours. No results for input(s): AMMONIA in the last 168 hours. CBC:  Recent Labs Lab 09/27/14 0150 09/28/14 0557  WBC 9.4 6.1  HGB 10.2* 9.8*  HCT 30.7* 30.1*  MCV 89.2 90.9  PLT 177 165   Cardiac Enzymes: No results for input(s): CKTOTAL, CKMB, CKMBINDEX, TROPONINI in the last 168 hours. BNP (last 3 results) No results for input(s): PROBNP in the last 8760 hours. CBG:  Recent Labs Lab 09/27/14 0746 09/27/14 1218 09/27/14 1653 09/27/14 2140 09/28/14 0756  GLUCAP 115* 123* 99 119* 104*    Recent Results (from the past 240 hour(s))  Culture, blood (routine x 2)     Status: None (Preliminary result)   Collection Time: 09/27/14  6:15 AM  Result Value Ref Range Status   Specimen Description BLOOD RIGHT FOREARM  Final   Special Requests BOTTLES DRAWN AEROBIC AND ANAEROBIC 10CC  Final   Culture  Setup Time   Final    09/27/2014 13:28 Performed at [facility]    Culture   Final           BLOOD CULTURE RECEIVED NO GROWTH TO DATE CULTURE WILL BE HELD FOR 5 DAYS BEFORE ISSUING A FINAL NEGATIVE REPORT Performed at [facility]  Report Status PENDING  Incomplete  Culture, blood (routine x 2)     Status: None (Preliminary result)   Collection Time: 09/27/14  6:22 AM  Result Value Ref Range Status   Specimen Description BLOOD RIGHT HAND  Final   Special Requests BOTTLES DRAWN AEROBIC ONLY 10CC  Final   Culture  Setup Time   Final    09/27/2014 13:30 Performed at [facility]    Culture   Final           BLOOD CULTURE RECEIVED NO GROWTH TO DATE CULTURE WILL BE HELD FOR 5 DAYS BEFORE ISSUING A FINAL NEGATIVE REPORT Performed at [facility]    Report Status PENDING  Incomplete     Studies: Ct Head Wo Contrast  09/27/2014   CLINICAL DATA:  Severe weakness while attempting to stand, acute onset. Recent fall last week,  with bruising about the right orbit. Initial encounter.  EXAM: CT HEAD WITHOUT CONTRAST  TECHNIQUE: Contiguous axial images were obtained from the base of the skull through the vertex without intravenous contrast.  COMPARISON:  CT of the head performed 05/22/2012  FINDINGS: There is no evidence of acute infarction, mass lesion, or intra- or extra-axial hemorrhage on CT.  Prominence of the ventricles and sulci reflects mild cortical volume loss. Mild periventricular white matter change likely reflects small vessel ischemic microangiopathy. 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  The brainstem and fourth ventricle are within normal limits. The basal ganglia are unremarkable in appearance. No mass effect or midline shift is seen.  There is no evidence of fracture; visualized osseous structures are unremarkable in appearance. The orbits are within normal limits. Mild mucosal thickening is noted at the maxillary sinuses bilaterally. The remaining paranasal sinuses and mastoid air cells are well-aerated. No significant soft tissue abnormalities are seen.  IMPRESSION: 1. No acute intracranial pathology seen on CT. 2. Mild cortical volume loss and scattered small vessel ischemic microangiopathy. 3. 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 4. Mild mucosal thickening at the maxillary sinuses bilaterally.   Electronically Signed   By: Roanna Raider M.D.   On: 09/27/2014 03:52   Dg Chest Port 1 View  09/27/2014   CLINICAL DATA:  Shortness of breath.  Initial encounter.  EXAM: PORTABLE CHEST - 1 VIEW  COMPARISON:  01/15/2014 and prior chest radiographs  FINDINGS: Mild cardiomegaly again noted.  Mild bibasilar scarring again noted.  There is no evidence of focal airspace disease, pulmonary edema, suspicious pulmonary nodule/mass, pleural effusion, or pneumothorax. No acute bony abnormalities are identified.  IMPRESSION: No active disease.   Electronically  Signed   By: Laveda Abbe M.D.   On: 09/27/2014 02:33    Scheduled Meds: . carvedilol  6.25 mg Oral BID WC  . cefTRIAXone (ROCEPHIN)  IV  1 g Intravenous Q24H  . docusate sodium  100 mg Oral TID  . dronedarone  400 mg Oral BID WC  . insulin aspart  0-9 Units Subcutaneous TID WC  . lactose free nutrition  237 mL Oral BID BM  . levothyroxine  50 mcg Oral QAC breakfast  . loratadine  10 mg Oral Daily  . polyethylene glycol  17 g Oral Daily  . pravastatin  40 mg Oral q1800  . sodium chloride  3 mL Intravenous Q12H  . warfarin  2.5 mg Oral Q M,W,F-1800  . warfarin  5 mg Oral Q T,Th,S,Su-1800  . Warfarin - Pharmacist Dosing Inpatient  Does not apply q1800   Continuous Infusions: . sodium chloride 100 mL/hr at 09/27/14 2245   Antibiotics Given (last 72 hours)    Date/Time Action Medication Dose Rate   09/27/14 2100 Given   cefTRIAXone (ROCEPHIN) 1 g in dextrose 5 % 50 mL IVPB 1 g 100 mL/hr      Principal Problem:   UTI (lower urinary tract infection) Active Problems:   PAF (paroxysmal atrial fibrillation), recurrent symptomatic AF this admission   Chronic anticoagulation   Diabetes mellitus   HTN (hypertension), hypotensive in AF   Dyslipidemia   BPH (benign prostatic hyperplasia), Dr Vernie Ammonsttelin follows   Chronic renal insufficiency, stage III (moderate)    Time spent: 25 min    Benjamine MolaVANN, JESSICA  [facility] Pager [phone]. If 7PM-7AM, please contact night-coverage at [email or web address], password [email or web address] 09/28/2014, 9:44 AM  LOS: 1 day

## 2014-09-29 DIAGNOSIS — Z789 Other specified health status: Secondary | ICD-10-CM

## 2014-09-29 LAB — GLUCOSE, CAPILLARY
GLUCOSE-CAPILLARY: 156 mg/dL — AB (ref 70–99)
Glucose-Capillary: 138 mg/dL — ABNORMAL HIGH (ref 70–99)

## 2014-09-29 LAB — URINE CULTURE: Colony Count: >=100000

## 2014-09-29 LAB — PROTIME-INR
INR: 2.1 — ABNORMAL HIGH (ref 0.00–1.49)
PROTHROMBIN TIME: 23.8 s — AB (ref 11.6–15.2)

## 2014-09-29 MED ORDER — CEPHALEXIN 500 MG PO CAPS
500.0000 mg | ORAL_CAPSULE | Freq: Two times a day (BID) | ORAL | Status: DC
  Administered 2014-09-29: 500 mg via ORAL
  Filled 2014-09-29 (×2): qty 1

## 2014-09-29 MED ORDER — CEPHALEXIN 500 MG PO CAPS: 500.0000 mg | ORAL_CAPSULE | Freq: Two times a day (BID) | ORAL | Status: DC

## 2014-09-29 NOTE — Progress Notes (Signed)
Ronald Payne to be D/C'd Home per MD order.  Discussed with the patient and all questions fully answered.    Medication List    TAKE these medications        benzonatate 100 MG capsule  Commonly known as:  TESSALON  Take 100 mg by mouth 3 (three) times daily as needed for cough.     carvedilol 6.25 MG tablet  Commonly known as:  COREG  Take 6.25 mg by mouth 2 (two) times daily with a meal.     cephALEXin 500 MG capsule  Commonly known as:  KEFLEX  Take 1 capsule (500 mg total) by mouth every 12 (twelve) hours.     cetirizine 10 MG tablet  Commonly known as:  ZYRTEC  Take 10 mg by mouth daily. Take 1 tablet daily for allergies.     COSAMIN DS PO  Take 1 tablet by mouth 2 (two) times daily.     docusate sodium 100 MG capsule  Commonly known as:  COLACE  Take 100 mg by mouth 3 (three) times daily. For constipation     dronedarone 400 MG tablet  Commonly known as:  MULTAQ  Take 1 tablet (400 mg total) by mouth 2 (two) times daily with a meal.     fluticasone 50 MCG/ACT nasal spray  Commonly known as:  FLONASE  Place 1 spray into both nostrils daily as needed for allergies or rhinitis.     HYDROcodone-acetaminophen 5-325 MG per tablet  Commonly known as:  NORCO/VICODIN  Take 1 tablet by mouth every 6 (six) hours as needed for moderate pain.     levothyroxine 50 MCG tablet  Commonly known as:  SYNTHROID, LEVOTHROID  Take 50 mcg by mouth daily.     lovastatin 40 MG tablet  Commonly known as:  MEVACOR  Take 40 mg by mouth at bedtime. Take 40 mg daily to control cholesterol.     metFORMIN 500 MG tablet  Commonly known as:  GLUCOPHAGE  Take 500 mg by mouth 2 (two) times daily with a meal. Take 1 tablet twice daily for blood sugar.     polyethylene glycol packet  Commonly known as:  MIRALAX / GLYCOLAX  Take 17 g by mouth daily.     warfarin 5 MG tablet  Commonly known as:  COUMADIN  Take 2.5-5 mg by mouth daily. Take 2.5mg  on Monday, Wednesday and Friday , All  other days including Sunday take 5mg         VVS, Skin clean, dry and intact without evidence of skin break down, no evidence of skin tears noted. IV catheter discontinued intact. Site without signs and symptoms of complications. Dressing and pressure applied.  An After Visit Summary was printed and given to the patient.  D/c education completed with patient/family including follow up instructions, medication list, d/c activities limitations if indicated, with other d/c instructions as indicated by MD - patient able to verbalize understanding, all questions fully answered.   Patient instructed to return to ED, call 911, or call MD for any changes in condition.   Patient escorted via WC, and D/C home via private auto.  Ronald Payne D 09/29/2014 4:01 PM

## 2014-09-29 NOTE — Progress Notes (Signed)
ANTICOAGULATION CONSULT NOTE   Pharmacy Consult for Coumadin Indication: atrial fibrillation  Allergies  Allergen Reactions  . Amiodarone Other (See Comments)    Hyperthyroid on Amio in the past, changed to Multaq    Patient Measurements: Height: 6' (182.9 cm) Weight: 182 lb 1.6 oz (82.6 kg) IBW/kg (Calculated) : 77.6  Vital Signs: Temp: 98.2 F (36.8 C) (12/03 0643) Temp Source: Oral (12/03 0643) BP: 108/85 mmHg (12/03 0643) Pulse Rate: 118 (12/03 0643)  Labs:  Recent Labs  09/27/14 0150 09/28/14 0557 09/29/14 0648  HGB 10.2* 9.8*  --   HCT 30.7* 30.1*  --   PLT 177 165  --   LABPROT 23.4* 23.8* 23.8*  INR 2.06* 2.11* 2.10*  CREATININE 1.07 1.03  --     Estimated Creatinine Clearance: 53.4 mL/min (by C-G formula based on Cr of 1.03).   Medical History: Past Medical History  Diagnosis Date  . Shortness of breath   . Hypothyroidism   . Hypertension   . Dysrhythmia     atrial fibrilation  . Arthritis   . Diabetes mellitus     type 2  . Insomnia, unspecified   . Acute bronchitis   . Other and unspecified hyperlipidemia   . Unspecified hearing loss   . Insomnia, unspecified   . Cataract     Medications:  Prescriptions prior to admission  Medication Sig Dispense Refill Last Dose  . benzonatate (TESSALON) 100 MG capsule Take 100 mg by mouth 3 (three) times daily as needed for cough.   Past Week at Unknown time  . carvedilol (COREG) 6.25 MG tablet Take 6.25 mg by mouth 2 (two) times daily with a meal.   09/26/2014 at 1700  . cetirizine (ZYRTEC) 10 MG tablet Take 10 mg by mouth daily. Take 1 tablet daily for allergies.   09/26/2014 at Unknown time  . docusate sodium (COLACE) 100 MG capsule Take 100 mg by mouth 3 (three) times daily. For constipation   09/26/2014 at Unknown time  . dronedarone (MULTAQ) 400 MG tablet Take 1 tablet (400 mg total) by mouth 2 (two) times daily with a meal. 180 tablet 3 09/26/2014 at Unknown time  . fluticasone (FLONASE) 50  MCG/ACT nasal spray Place 1 spray into both nostrils daily as needed for allergies or rhinitis.   09/26/2014 at Unknown time  . Glucosamine-Chondroitin (COSAMIN DS PO) Take 1 tablet by mouth 2 (two) times daily.     09/26/2014 at Unknown time  . HYDROcodone-acetaminophen (NORCO/VICODIN) 5-325 MG per tablet Take 1 tablet by mouth every 6 (six) hours as needed for moderate pain.   Past Month at Unknown time  . levothyroxine (SYNTHROID, LEVOTHROID) 50 MCG tablet Take 50 mcg by mouth daily.     09/26/2014 at Unknown time  . lovastatin (MEVACOR) 40 MG tablet Take 40 mg by mouth at bedtime. Take 40 mg daily to control cholesterol.   09/26/2014 at Unknown time  . metFORMIN (GLUCOPHAGE) 500 MG tablet Take 500 mg by mouth 2 (two) times daily with a meal. Take 1 tablet twice daily for blood sugar.   09/26/2014 at Unknown time  . polyethylene glycol (MIRALAX / GLYCOLAX) packet Take 17 g by mouth daily.   09/26/2014 at Unknown time  . warfarin (COUMADIN) 5 MG tablet Take 2.5-5 mg by mouth daily. Take 2.5mg  on Monday, Wednesday and Friday , All other days including Sunday take 5mg    09/26/2014 at Unknown time    Assessment: 80 yo male admitted with UTI, h/o Afib, to  continue Coumadin  INR remains therapeutic on home regimen  E coli in urine pan sensitive Goal of Therapy:  INR 2-3 Monitor platelets by anticoagulation protocol: Yes   Plan:  Continue home regimen Daily INR Consider Keflex for UTI  Hershal Eriksson Poteet 09/29/2014,10:41 AM

## 2014-09-29 NOTE — Care Management Note (Signed)
    Page 1 of 1   09/29/2014     2:36:31 PM CARE MANAGEMENT NOTE 09/29/2014  Patient:  REES, FATEMI A   Account Number:  [id number]  Date Initiated:  09/29/2014  Documentation initiated by:  Letha Cape  Subjective/Objective Assessment:   dx uti,  admit- lives with spouse.  Has rolling walker at home.     Action/Plan:   Anticipated DC Date:  09/29/2014   Anticipated DC Plan:  HOME W HOME HEALTH SERVICES      DC Planning Services  CM consult      [facility] Choice  HOME HEALTH   Choice offered to / List presented to:  C-1 Patient        HH arranged  HH-2 PT  HH-1 RN  HH-5 SPEECH THERAPY  HH-4 NURSE'S AIDE      HH agency  [facility]   Status of service:  Completed, signed off Medicare Important Message given?  NA - LOS <3 / Initial given by admissions (If response is "NO", the following Medicare IM given date fields will be blank) Date Medicare IM given:   Medicare IM given by:   Date Additional Medicare IM given:   Additional Medicare IM given by:    Discharge Disposition:  HOME W HOME HEALTH SERVICES  Per UR Regulation:  Reviewed for med. necessity/level of care/duration of stay  If discussed at Long Length of Stay Meetings, dates discussed:    Comments:  09/29/14 1434 Letha Cape RN,BSN [phone] patient for dc today, wife chose [facility] for [facility], PT, aide and speech, she states she would like the speech therapist they had before , Revonda Standard.  Referral given to [facility] , Miranda notified.  Soc will begin 24-48 hrs post dc.

## 2014-09-29 NOTE — Discharge Summary (Signed)
Physician Discharge Summary  Ronald Payne TWK:[id number] DOB: 1923-10-03 DOA: 09/27/2014  PCP: Aura Dials, MD  Admit date: 09/27/2014 Discharge date: 09/29/2014  Time spent: 35 minutes  Recommendations for Outpatient Follow-up:  1. Home health 2. D/c'd with foley  Discharge Diagnoses:  Principal Problem:   UTI (lower urinary tract infection) Active Problems:   PAF (paroxysmal atrial fibrillation), recurrent symptomatic AF this admission   Chronic anticoagulation   Diabetes mellitus   HTN (hypertension), hypotensive in AF   Dyslipidemia   BPH (benign prostatic hyperplasia), Dr Vernie Ammons follows   Chronic renal insufficiency, stage III (moderate)   Discharge Condition: improved  Diet recommendation: DYS  Filed Weights   09/27/14 0536 09/28/14 0441 09/29/14 0500  Weight: 79.9 kg (176 lb 2.4 oz) 82.6 kg (182 lb 1.6 oz) 82.6 kg (182 lb 1.6 oz)    History of present illness:  Ronald Payne is a 80 y.o. male with past medical history of diabetes mellitus, hypothyroidism, hyperlipidemia, A. fib on Coumadin, hypertension, BPH, who presents with burning on urination, fever, generalized weakness and dizziness.  Patient reports that he is routinely doing in & out catheterization. In the past several days, he has been having burning on urination, but no dysuria. He feels weak and developed fever. In the yesterday morning, he had dizziness and becomes weaker and lightheaded. Pt reports that he fell and bruised his right eye one weak ago. Pt also reports that he has a constant productive cough with dark sputum and possible aspiration per his wife. Pt denies nausea, vomiting, or diarrhea, unilateral weakness or numbness in his extremities. He has chronic constipation.   Work up in the ED demonstrates positive UA for UTI, fever 100.4, lactate 0.99, INR 2.06. CT-head and CXR negative for acute abnormalities.   Hospital Course:  UTI and sepsis secondary to UTI: patient's symptoms of  generalized weakness, dizziness, fever, burning on urination are most consistent with UTI. This is considered complicated UTI as patient has to self catheterize 5 times a day.  -rocephin -Adjust antibiotics to PO. -Blood cultures x 2 NGTD -increase coreg  Diabetes mellitus type 2: A1c was 6.2 on 01/14/14. On metformin at home  Atrial fibrillation:  -continue Multaq -coreg -coumadin per pharmacy  Cough: Patient reported that he has chronic cough, with possible aspiration. Chest x-ray is negative. -Swallowing test- MBS in June- DYS 3 with nectar thick  Procedures:    Consultations:    Discharge Exam: Filed Vitals:   09/29/14 0643  BP: 108/85  Pulse: 118  Temp: 98.2 F (36.8 C)  Resp: 18      Discharge Instructions You were cared for by a hospitalist during your hospital stay. If you have any questions about your discharge medications or the care you received while you were in the hospital after you are discharged, you can call the unit and asked to speak with the hospitalist on call if the hospitalist that took care of you is not available. Once you are discharged, your primary care physician will handle any further medical issues. Please note that NO REFILLS for any discharge medications will be authorized once you are discharged, as it is imperative that you return to your primary care physician (or establish a relationship with a primary care physician if you do not have one) for your aftercare needs so that they can reassess your need for medications and monitor your lab values.  Discharge Instructions    Diet - low sodium heart healthy    Complete by:  As directed      Diet Carb Modified    Complete by:  As directed      Discharge instructions    Complete by:  As directed   Home health Supervision by family D/c with foley     Increase activity slowly    Complete by:  As directed           Current Discharge Medication List    START taking these medications    Details  cephALEXin (KEFLEX) 500 MG capsule Take 1 capsule (500 mg total) by mouth every 12 (twelve) hours. Qty: 12 capsule, Refills: 0      CONTINUE these medications which have NOT CHANGED   Details  benzonatate (TESSALON) 100 MG capsule Take 100 mg by mouth 3 (three) times daily as needed for cough.    carvedilol (COREG) 6.25 MG tablet Take 6.25 mg by mouth 2 (two) times daily with a meal.    cetirizine (ZYRTEC) 10 MG tablet Take 10 mg by mouth daily. Take 1 tablet daily for allergies.    docusate sodium (COLACE) 100 MG capsule Take 100 mg by mouth 3 (three) times daily. For constipation    dronedarone (MULTAQ) 400 MG tablet Take 1 tablet (400 mg total) by mouth 2 (two) times daily with a meal. Qty: 180 tablet, Refills: 3    fluticasone (FLONASE) 50 MCG/ACT nasal spray Place 1 spray into both nostrils daily as needed for allergies or rhinitis.    Glucosamine-Chondroitin (COSAMIN DS PO) Take 1 tablet by mouth 2 (two) times daily.      HYDROcodone-acetaminophen (NORCO/VICODIN) 5-325 MG per tablet Take 1 tablet by mouth every 6 (six) hours as needed for moderate pain.    levothyroxine (SYNTHROID, LEVOTHROID) 50 MCG tablet Take 50 mcg by mouth daily.      lovastatin (MEVACOR) 40 MG tablet Take 40 mg by mouth at bedtime. Take 40 mg daily to control cholesterol.    metFORMIN (GLUCOPHAGE) 500 MG tablet Take 500 mg by mouth 2 (two) times daily with a meal. Take 1 tablet twice daily for blood sugar.    polyethylene glycol (MIRALAX / GLYCOLAX) packet Take 17 g by mouth daily.    warfarin (COUMADIN) 5 MG tablet Take 2.5-5 mg by mouth daily. Take 2.5mg  on Monday, Wednesday and Friday , All other days including Sunday take 5mg        Allergies  Allergen Reactions  . Amiodarone Other (See Comments)    Hyperthyroid on Amio in the past, changed to Multaq      The results of significant diagnostics from this hospitalization (including imaging, microbiology, ancillary and laboratory)  are listed below for reference.    Significant Diagnostic Studies: Ct Head Wo Contrast  09/27/2014   CLINICAL DATA:  Severe weakness while attempting to stand, acute onset. Recent fall last week, with bruising about the right orbit. Initial encounter.  EXAM: CT HEAD WITHOUT CONTRAST  TECHNIQUE: Contiguous axial images were obtained from the base of the skull through the vertex without intravenous contrast.  COMPARISON:  CT of the head performed 05/22/2012  FINDINGS: There is no evidence of acute infarction, mass lesion, or intra- or extra-axial hemorrhage on CT.  Prominence of the ventricles and sulci reflects mild cortical volume loss. Mild periventricular white matter change likely reflects small vessel ischemic microangiopathy. 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  The brainstem and fourth ventricle are within normal limits. The basal ganglia are unremarkable in appearance. No mass effect or  midline shift is seen.  There is no evidence of fracture; visualized osseous structures are unremarkable in appearance. The orbits are within normal limits. Mild mucosal thickening is noted at the maxillary sinuses bilaterally. The remaining paranasal sinuses and mastoid air cells are well-aerated. No significant soft tissue abnormalities are seen.  IMPRESSION: 1. No acute intracranial pathology seen on CT. 2. Mild cortical volume loss and scattered small vessel ischemic microangiopathy. 3. 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 4. Mild mucosal thickening at the maxillary sinuses bilaterally.   Electronically Signed   By: Roanna RaiderJeffery  Chang M.D.   On: 09/27/2014 03:52   Dg Chest Port 1 View  09/27/2014   CLINICAL DATA:  Shortness of breath.  Initial encounter.  EXAM: PORTABLE CHEST - 1 VIEW  COMPARISON:  01/15/2014 and prior chest radiographs  FINDINGS: Mild cardiomegaly again noted.  Mild bibasilar scarring again noted.  There is no evidence of  focal airspace disease, pulmonary edema, suspicious pulmonary nodule/mass, pleural effusion, or pneumothorax. No acute bony abnormalities are identified.  IMPRESSION: No active disease.   Electronically Signed   By: Laveda AbbeJeff  Hu M.D.   On: 09/27/2014 02:33    Microbiology: Recent Results (from the past 240 hour(s))  Urine culture     Status: None   Collection Time: 09/27/14  6:14 AM  Result Value Ref Range Status   Specimen Description URINE, CATHETERIZED  Final   Special Requests NONE  Final   Culture  Setup Time   Final    09/27/2014 13:31 Performed at [facility]    Colony Count   Final    >=100,000 COLONIES/ML Performed at [facility]    Culture   Final    ESCHERICHIA COLI Performed at [facility]    Report Status 09/29/2014 FINAL  Final   Organism ID, Bacteria ESCHERICHIA COLI  Final      Susceptibility   Escherichia coli - MIC*    AMPICILLIN <=2 SENSITIVE Sensitive     CEFAZOLIN <=4 SENSITIVE Sensitive     CEFTRIAXONE <=1 SENSITIVE Sensitive     CIPROFLOXACIN <=0.25 SENSITIVE Sensitive     GENTAMICIN <=1 SENSITIVE Sensitive     LEVOFLOXACIN <=0.12 SENSITIVE Sensitive     NITROFURANTOIN <=16 SENSITIVE Sensitive     TOBRAMYCIN <=1 SENSITIVE Sensitive     TRIMETH/SULFA <=20 SENSITIVE Sensitive     PIP/TAZO <=4 SENSITIVE Sensitive     * ESCHERICHIA COLI  Culture, blood (routine x 2)     Status: None (Preliminary result)   Collection Time: 09/27/14  6:15 AM  Result Value Ref Range Status   Specimen Description BLOOD RIGHT FOREARM  Final   Special Requests BOTTLES DRAWN AEROBIC AND ANAEROBIC 10CC  Final   Culture  Setup Time   Final    09/27/2014 13:28 Performed at [facility]    Culture   Final           BLOOD CULTURE RECEIVED NO GROWTH TO DATE CULTURE WILL BE HELD FOR 5 DAYS BEFORE ISSUING A FINAL NEGATIVE REPORT Performed at [facility]    Report Status PENDING  Incomplete  Culture, blood (routine x 2)     Status: None  (Preliminary result)   Collection Time: 09/27/14  6:22 AM  Result Value Ref Range Status   Specimen Description BLOOD RIGHT HAND  Final   Special Requests BOTTLES DRAWN AEROBIC ONLY 10CC  Final   Culture  Setup Time   Final    09/27/2014 13:30  Performed at [facility]   Final           BLOOD CULTURE RECEIVED NO GROWTH TO DATE CULTURE WILL BE HELD FOR 5 DAYS BEFORE ISSUING A FINAL NEGATIVE REPORT Performed at [facility]    Report Status PENDING  Incomplete     Labs: Basic Metabolic Panel:  Recent Labs Lab 09/27/14 0150 09/28/14 0557  NA 134* 138  K 3.9 4.2  CL 97 105  CO2 22 22  GLUCOSE 107* 114*  BUN 21 17  CREATININE 1.07 1.03  CALCIUM 9.7 9.2  MG  --  1.9   Liver Function Tests:  Recent Labs Lab 09/28/14 0557  AST 12  ALT 7  ALKPHOS 58  BILITOT 0.2*  PROT 6.0  ALBUMIN 3.0*   No results for input(s): LIPASE, AMYLASE in the last 168 hours. No results for input(s): AMMONIA in the last 168 hours. CBC:  Recent Labs Lab 09/27/14 0150 09/28/14 0557  WBC 9.4 6.1  HGB 10.2* 9.8*  HCT 30.7* 30.1*  MCV 89.2 90.9  PLT 177 165   Cardiac Enzymes: No results for input(s): CKTOTAL, CKMB, CKMBINDEX, TROPONINI in the last 168 hours. BNP: BNP (last 3 results) No results for input(s): PROBNP in the last 8760 hours. CBG:  Recent Labs Lab 09/28/14 0756 09/28/14 1232 09/28/14 1822 09/29/14 0755 09/29/14 1155  GLUCAP 104* 125* 144* 138* 156*       Signed:  Dustine Stickler  [facility] 09/29/2014, 1:41 PM

## 2014-09-30 ENCOUNTER — Other Ambulatory Visit: Payer: Self-pay | Admitting: Cardiovascular Disease

## 2014-09-30 NOTE — Telephone Encounter (Signed)
Rx has been sent to the pharmacy electronically. ° °

## 2014-10-03 LAB — CULTURE, BLOOD (ROUTINE X 2)
Culture: NO GROWTH
Culture: NO GROWTH

## 2014-11-02 ENCOUNTER — Other Ambulatory Visit: Payer: Self-pay | Admitting: Cardiovascular Disease

## 2014-11-02 NOTE — Telephone Encounter (Signed)
Rx(s) sent to pharmacy electronically.  

## 2014-11-10 ENCOUNTER — Encounter (HOSPITAL_COMMUNITY): Payer: Self-pay | Admitting: Internal Medicine

## 2014-11-14 ENCOUNTER — Ambulatory Visit (INDEPENDENT_AMBULATORY_CARE_PROVIDER_SITE_OTHER): Payer: Medicare Other | Admitting: Pharmacist Clinician (PhC)/ Clinical Pharmacy Specialist

## 2014-11-14 DIAGNOSIS — I480 Paroxysmal atrial fibrillation: Secondary | ICD-10-CM

## 2014-11-14 DIAGNOSIS — Z7901 Long term (current) use of anticoagulants: Secondary | ICD-10-CM

## 2014-11-14 LAB — POCT INR: INR: 2.2

## 2014-11-16 ENCOUNTER — Other Ambulatory Visit: Payer: Self-pay | Admitting: Cardiovascular Disease

## 2014-12-12 ENCOUNTER — Ambulatory Visit: Payer: Self-pay | Admitting: Pharmacist Clinician (PhC)/ Clinical Pharmacy Specialist

## 2014-12-19 ENCOUNTER — Ambulatory Visit (INDEPENDENT_AMBULATORY_CARE_PROVIDER_SITE_OTHER): Payer: Medicare Other | Admitting: Pharmacist Clinician (PhC)/ Clinical Pharmacy Specialist

## 2014-12-19 DIAGNOSIS — Z7901 Long term (current) use of anticoagulants: Secondary | ICD-10-CM

## 2014-12-19 DIAGNOSIS — I480 Paroxysmal atrial fibrillation: Secondary | ICD-10-CM

## 2014-12-19 LAB — POCT INR: INR: 1.8

## 2015-01-09 ENCOUNTER — Ambulatory Visit (INDEPENDENT_AMBULATORY_CARE_PROVIDER_SITE_OTHER): Payer: Medicare Other | Admitting: Pharmacist Clinician (PhC)/ Clinical Pharmacy Specialist

## 2015-01-09 DIAGNOSIS — Z7901 Long term (current) use of anticoagulants: Secondary | ICD-10-CM

## 2015-01-09 DIAGNOSIS — I480 Paroxysmal atrial fibrillation: Secondary | ICD-10-CM

## 2015-01-09 LAB — POCT INR: INR: 2.7

## 2015-02-20 ENCOUNTER — Ambulatory Visit (INDEPENDENT_AMBULATORY_CARE_PROVIDER_SITE_OTHER): Payer: Medicare Other | Admitting: Pharmacist Clinician (PhC)/ Clinical Pharmacy Specialist

## 2015-02-20 DIAGNOSIS — Z7901 Long term (current) use of anticoagulants: Secondary | ICD-10-CM | POA: Diagnosis not present

## 2015-02-20 DIAGNOSIS — I480 Paroxysmal atrial fibrillation: Secondary | ICD-10-CM

## 2015-02-20 LAB — POCT INR: INR: 4.2

## 2015-03-06 ENCOUNTER — Ambulatory Visit (INDEPENDENT_AMBULATORY_CARE_PROVIDER_SITE_OTHER)
Admission: RE | Admit: 2015-03-06 | Discharge: 2015-03-06 | Disposition: A | Discharge status: Home / Self Care | Payer: Self-pay | Source: Ambulatory Visit | Attending: Internal Medicine | Admitting: Internal Medicine

## 2015-03-06 ENCOUNTER — Encounter: Payer: Self-pay | Admitting: Internal Medicine

## 2015-03-06 ENCOUNTER — Ambulatory Visit (INDEPENDENT_AMBULATORY_CARE_PROVIDER_SITE_OTHER): Payer: Medicare Other | Admitting: Internal Medicine

## 2015-03-06 ENCOUNTER — Ambulatory Visit (INDEPENDENT_AMBULATORY_CARE_PROVIDER_SITE_OTHER): Payer: Medicare Other | Admitting: Pharmacist Clinician (PhC)/ Clinical Pharmacy Specialist

## 2015-03-06 ENCOUNTER — Other Ambulatory Visit (HOSPITAL_COMMUNITY): Payer: Self-pay | Admitting: Internal Medicine

## 2015-03-06 VITALS — BP 118/80 | HR 71 | Ht 70.0 in | Wt 171.0 lb

## 2015-03-06 DIAGNOSIS — R058 Other specified cough: Secondary | ICD-10-CM | POA: Insufficient documentation

## 2015-03-06 DIAGNOSIS — I480 Paroxysmal atrial fibrillation: Secondary | ICD-10-CM

## 2015-03-06 DIAGNOSIS — R1314 Dysphagia, pharyngoesophageal phase: Secondary | ICD-10-CM

## 2015-03-06 DIAGNOSIS — Z7901 Long term (current) use of anticoagulants: Secondary | ICD-10-CM | POA: Diagnosis not present

## 2015-03-06 DIAGNOSIS — R05 Cough: Secondary | ICD-10-CM | POA: Diagnosis not present

## 2015-03-06 DIAGNOSIS — R0600 Dyspnea, unspecified: Secondary | ICD-10-CM | POA: Insufficient documentation

## 2015-03-06 LAB — POCT INR: INR: 2.6

## 2015-03-06 MED ORDER — LANSOPRAZOLE 3 MG/ML SUSP: ORAL | Status: DC

## 2015-03-06 NOTE — Patient Instructions (Addendum)
Start prevacid liquid 2 tsp Take 30- 60 min before your first and last meals of the day   Set up appt with with Edwards in a few weeks to review your options and response to trial of prevacid   Please remember to go to the  x-ray department downstairs for your tests - we will call you with the results when they are available.  Please see patient coordinator before you leave today  to schedule modified barium swallow    GERD (REFLUX)  is an extremely common cause of respiratory symptoms just like yours , many times with no obvious heartburn at all.    It can be treated with medication, but also with lifestyle changes including avoidance of late meals, excessive alcohol, smoking cessation, and avoid fatty foods, chocolate, peppermint, colas, red wine, and acidic juices such as orange juice.  NO MINT OR MENTHOL PRODUCTS SO NO COUGH DROPS  USE SUGARLESS CANDY INSTEAD (Jolley ranchers or Stover's or Life Savers) or even ice chips will also do - the key is to swallow to prevent all throat clearing. NO OIL BASED VITAMINS - use powdered substitutes.  No pulmonary follow up needed

## 2015-03-06 NOTE — Assessment & Plan Note (Signed)
Concerned about chronic macrodantin use here - nothing to suggest pulmonary toxicity but it would be extremely easy to miss in this setting  At this point he seems much more limited by weakness than sob so defer further w/u for now.   See instructions for specific recommendations which were reviewed directly with the patient who was given a copy with highlighter outlining the key components.

## 2015-03-06 NOTE — Assessment & Plan Note (Addendum)
Prev w/u 09/28/14 reviewed and he has def progressed since then by hx> severe chronic cough related to eating.  Assoc with classic cough related to eating and likely the result of neurologic dysfunction ? Prev cva but also concerned about generalized muslce weakness in view of fact he could not get out of chair s max assist.   Rec:  Empiric trial of gerd diet/ acid suppression pending f/u by ST / GI ( Dr Randa Evens)  Homero Fellers discussion on possible need for PEG which he strongly resistent  Consideration for neuro and / or end of life discussion as he is at very high risk massive asp/ acute resp failure but nothing we can offer in the this office.   See instructions for specific recommendations which were reviewed directly with the patient who was given a copy with highlighter outlining the key components.

## 2015-03-06 NOTE — Assessment & Plan Note (Signed)
Classic Upper airway cough syndrome, so named because it's frequently impossible to sort out how much is  CR/sinusitis with freq throat clearing (which can be related to primary GERD)   vs  causing  secondary (" extra esophageal")  GERD from wide swings in gastric pressure that occur with throat clearing, often  promoting self use of mint and menthol lozenges that reduce the lower esophageal sphincter tone and exacerbate the problem further in a cyclical fashion.   These are the same pts (now being labeled as having "irritable larynx syndrome" by some cough centers) who not infrequently have a history of having failed to tolerate ace inhibitors,  dry powder inhalers or biphosphonates or report having atypical reflux symptoms that don't respond to standard doses of PPI , and are easily confused as having aecopd or asthma flares by even experienced allergists/ pulmonologists.   See dysphagia which I believe is neurologic and the likely cause of his problems/ nothing else to offer in this clinic  F/u can be prn.

## 2015-03-06 NOTE — Progress Notes (Signed)
Subjective:    Patient ID: Ronald Payne, male    DOB: 10-19-1923,  MRN: [id number]  HPI  12 yowm quit smoking 1996 referred 03/06/2015 to pulmonary clinic by Dr Everlene Other for chronic cough ? Related to aspiration   03/06/2015 1st [facility] office visit/ Wert   Chief Complaint  Patient presents with  . Pulmonary Consult    Referred by Dr. Tracey Harries. Pt c/o cough x 6 months- worse after eating and drinking. Cough is sometimes prod with moderate white sputum. He has occ diff with swallowing. He has also noticed DOE with "walking too much"- was SOB walking from car to our building today.   onset was insidious/ pattern is progressively worse cough/ dysphagia with sense of food hanging in throat x 6 m since last ST eval rec D II/ thickened honey and notes much worse with coffee historically but avoided x 6 m.  ST eval c/w cva/ rx with anticoagulation but no neuro w/u to date and no f/u ST.  No h/o pneumonia or purulent sputum  No obvious other patterns in day to day or daytime variabilty or assoc  cp or chest tightness, subjective wheeze overt sinus or hb symptoms. No unusual exp hx or h/o childhood pna/ asthma or knowledge of premature birth.  Sleeping ok without nocturnal  or early am exacerbation  of respiratory  c/o's or need for noct saba. Also denies any obvious fluctuation of symptoms with weather or environmental changes or other aggravating or alleviating factors except as outlined above   Current Medications, Allergies, Complete Past Medical History, Past Surgical History, Family History, and Social History were reviewed in [employer] record.              Review of Systems  Constitutional: Negative for fever, chills, activity change, appetite change and unexpected weight change.  HENT: Negative for congestion, dental problem, postnasal drip, rhinorrhea, sneezing, sore throat, trouble swallowing and voice change.   Eyes: Negative for visual  disturbance.  Respiratory: Positive for cough and shortness of breath. Negative for choking.   Cardiovascular: Negative for chest pain and leg swelling.  Gastrointestinal: Negative for nausea, vomiting and abdominal pain.  Genitourinary: Negative for difficulty urinating.  Musculoskeletal: Negative for arthralgias.  Skin: Negative for rash.  Psychiatric/Behavioral: Negative for behavioral problems and confusion.       Objective:   Physical Exam  Extremely frail wm who could not stand from sitting position (has lift chair at home) to check walking sats   Wt Readings from Last 3 Encounters:  03/06/15 171 lb (77.565 kg)  09/29/14 182 lb 1.6 oz (82.6 kg)  06/08/14 182 lb 9.6 oz (82.827 kg)    Vital signs reviewed  HEENT: nl dentition, turbinates, and orophanx. Nl external ear canals without cough reflex   NECK :  without JVD/Nodes/TM/ nl carotid upstrokes bilaterally   LUNGS: no acc muscle use, clear to A and P bilaterally without cough on insp or exp maneuvers   CV:  RRR  no s3 or murmur or increase in P2, no edema   ABD:  soft and nontender with nl excursion in the supine position. No bruits or organomegaly, bowel sounds nl  MS:  warm without deformities, calf tenderness, cyanosis or clubbing  SKIN: warm and dry without lesions    NEURO:  alert, approp, no deficits     CXR PA and Lateral:   03/06/2015 :     I personally reviewed images and agree with radiology impression  as follows:     Suboptimal patient positioning on all views.  Cardiomegaly with a markedly tortuous thoracic aorta. No acute superimposed process.     Assessment & Plan:

## 2015-03-08 ENCOUNTER — Inpatient Hospital Stay (HOSPITAL_COMMUNITY)
Admission: EM | Admit: 2015-03-08 | Discharge: 2015-03-15 | DRG: 070 | Disposition: A | Discharge status: Hospice | Payer: Medicare Other | Attending: Internal Medicine | Admitting: Internal Medicine

## 2015-03-08 ENCOUNTER — Encounter (HOSPITAL_COMMUNITY): Payer: Self-pay | Admitting: Emergency Medicine

## 2015-03-08 ENCOUNTER — Telehealth: Payer: Self-pay | Admitting: *Deleted

## 2015-03-08 ENCOUNTER — Emergency Department (HOSPITAL_COMMUNITY): Payer: Medicare Other

## 2015-03-08 DIAGNOSIS — Z66 Do not resuscitate: Secondary | ICD-10-CM | POA: Diagnosis present

## 2015-03-08 DIAGNOSIS — N400 Enlarged prostate without lower urinary tract symptoms: Secondary | ICD-10-CM | POA: Diagnosis present

## 2015-03-08 DIAGNOSIS — R059 Cough, unspecified: Secondary | ICD-10-CM

## 2015-03-08 DIAGNOSIS — G9340 Encephalopathy, unspecified: Secondary | ICD-10-CM | POA: Diagnosis not present

## 2015-03-08 DIAGNOSIS — R339 Retention of urine, unspecified: Secondary | ICD-10-CM | POA: Diagnosis present

## 2015-03-08 DIAGNOSIS — Z87891 Personal history of nicotine dependence: Secondary | ICD-10-CM

## 2015-03-08 DIAGNOSIS — W19XXXA Unspecified fall, initial encounter: Secondary | ICD-10-CM | POA: Diagnosis present

## 2015-03-08 DIAGNOSIS — R410 Disorientation, unspecified: Secondary | ICD-10-CM | POA: Diagnosis not present

## 2015-03-08 DIAGNOSIS — F329 Major depressive disorder, single episode, unspecified: Secondary | ICD-10-CM | POA: Diagnosis present

## 2015-03-08 DIAGNOSIS — E039 Hypothyroidism, unspecified: Secondary | ICD-10-CM | POA: Diagnosis present

## 2015-03-08 DIAGNOSIS — M1990 Unspecified osteoarthritis, unspecified site: Secondary | ICD-10-CM | POA: Diagnosis present

## 2015-03-08 DIAGNOSIS — E785 Hyperlipidemia, unspecified: Secondary | ICD-10-CM | POA: Diagnosis present

## 2015-03-08 DIAGNOSIS — I10 Essential (primary) hypertension: Secondary | ICD-10-CM | POA: Diagnosis present

## 2015-03-08 DIAGNOSIS — R1310 Dysphagia, unspecified: Secondary | ICD-10-CM | POA: Diagnosis present

## 2015-03-08 DIAGNOSIS — R0902 Hypoxemia: Secondary | ICD-10-CM

## 2015-03-08 DIAGNOSIS — Z7901 Long term (current) use of anticoagulants: Secondary | ICD-10-CM

## 2015-03-08 DIAGNOSIS — G4700 Insomnia, unspecified: Secondary | ICD-10-CM | POA: Diagnosis present

## 2015-03-08 DIAGNOSIS — N390 Urinary tract infection, site not specified: Secondary | ICD-10-CM | POA: Diagnosis present

## 2015-03-08 DIAGNOSIS — H9190 Unspecified hearing loss, unspecified ear: Secondary | ICD-10-CM | POA: Diagnosis present

## 2015-03-08 DIAGNOSIS — Z515 Encounter for palliative care: Secondary | ICD-10-CM | POA: Insufficient documentation

## 2015-03-08 DIAGNOSIS — Z6822 Body mass index (BMI) 22.0-22.9, adult: Secondary | ICD-10-CM

## 2015-03-08 DIAGNOSIS — R05 Cough: Secondary | ICD-10-CM

## 2015-03-08 DIAGNOSIS — B9620 Unspecified Escherichia coli [E. coli] as the cause of diseases classified elsewhere: Secondary | ICD-10-CM | POA: Diagnosis present

## 2015-03-08 DIAGNOSIS — R058 Other specified cough: Secondary | ICD-10-CM

## 2015-03-08 DIAGNOSIS — I480 Paroxysmal atrial fibrillation: Secondary | ICD-10-CM | POA: Diagnosis present

## 2015-03-08 DIAGNOSIS — F0390 Unspecified dementia without behavioral disturbance: Secondary | ICD-10-CM | POA: Diagnosis present

## 2015-03-08 DIAGNOSIS — Y92009 Unspecified place in unspecified non-institutional (private) residence as the place of occurrence of the external cause: Secondary | ICD-10-CM

## 2015-03-08 DIAGNOSIS — F419 Anxiety disorder, unspecified: Secondary | ICD-10-CM | POA: Diagnosis present

## 2015-03-08 DIAGNOSIS — G20 Parkinson's disease: Secondary | ICD-10-CM | POA: Diagnosis present

## 2015-03-08 DIAGNOSIS — E43 Unspecified severe protein-calorie malnutrition: Secondary | ICD-10-CM | POA: Diagnosis present

## 2015-03-08 DIAGNOSIS — R627 Adult failure to thrive: Secondary | ICD-10-CM | POA: Insufficient documentation

## 2015-03-08 DIAGNOSIS — E119 Type 2 diabetes mellitus without complications: Secondary | ICD-10-CM | POA: Diagnosis present

## 2015-03-08 DIAGNOSIS — N401 Enlarged prostate with lower urinary tract symptoms: Secondary | ICD-10-CM | POA: Diagnosis present

## 2015-03-08 HISTORY — DX: Paroxysmal atrial fibrillation: I48.0

## 2015-03-08 HISTORY — DX: Unspecified atrial flutter: I48.92

## 2015-03-08 LAB — URINALYSIS, ROUTINE W REFLEX MICROSCOPIC
Bilirubin Urine: NEGATIVE
Glucose, UA: NEGATIVE mg/dL
Hgb urine dipstick: NEGATIVE
Ketones, ur: NEGATIVE mg/dL
Nitrite: NEGATIVE
PH: 5.5 (ref 5.0–8.0)
Protein, ur: NEGATIVE mg/dL
SPECIFIC GRAVITY, URINE: 1.022 (ref 1.005–1.030)
Urobilinogen, UA: 0.2 mg/dL (ref 0.0–1.0)

## 2015-03-08 LAB — BASIC METABOLIC PANEL
Anion gap: 10 (ref 5–15)
BUN: 18 mg/dL (ref 6–20)
CO2: 23 mmol/L (ref 22–32)
Calcium: 9.7 mg/dL (ref 8.9–10.3)
Chloride: 106 mmol/L (ref 101–111)
Creatinine, Ser: 1.35 mg/dL — ABNORMAL HIGH (ref 0.61–1.24)
GFR calc Af Amer: 52 mL/min — ABNORMAL LOW (ref >60)
GFR, EST NON AFRICAN AMERICAN: 45 mL/min — AB (ref >60)
GLUCOSE: 121 mg/dL — AB (ref 70–99)
Potassium: 3.7 mmol/L (ref 3.5–5.1)
Sodium: 139 mmol/L (ref 135–145)

## 2015-03-08 LAB — GRAM STAIN

## 2015-03-08 LAB — CBC WITH DIFFERENTIAL/PLATELET
Basophils Absolute: 0 10*3/uL (ref 0.0–0.1)
Basophils Relative: 0 % (ref 0–1)
Eosinophils Absolute: 0.1 10*3/uL (ref 0.0–0.7)
Eosinophils Relative: 0 % (ref 0–5)
HCT: 31.2 % — ABNORMAL LOW (ref 39.0–52.0)
Hemoglobin: 10.2 g/dL — ABNORMAL LOW (ref 13.0–17.0)
LYMPHS ABS: 1 10*3/uL (ref 0.7–4.0)
Lymphocytes Relative: 8 % — ABNORMAL LOW (ref 12–46)
MCH: 29.7 pg (ref 26.0–34.0)
MCHC: 32.7 g/dL (ref 30.0–36.0)
MCV: 91 fL (ref 78.0–100.0)
MONO ABS: 1.1 10*3/uL — AB (ref 0.1–1.0)
MONOS PCT: 10 % (ref 3–12)
Neutro Abs: 9.6 10*3/uL — ABNORMAL HIGH (ref 1.7–7.7)
Neutrophils Relative %: 82 % — ABNORMAL HIGH (ref 43–77)
Platelets: 181 10*3/uL (ref 150–400)
RBC: 3.43 MIL/uL — AB (ref 4.22–5.81)
RDW: 14.3 % (ref 11.5–15.5)
WBC: 11.7 10*3/uL — ABNORMAL HIGH (ref 4.0–10.5)

## 2015-03-08 LAB — URINE MICROSCOPIC-ADD ON

## 2015-03-08 LAB — LACTIC ACID, PLASMA: LACTIC ACID, VENOUS: 1.2 mmol/L (ref 0.5–2.0)

## 2015-03-08 LAB — TSH: TSH: 0.535 u[IU]/mL (ref 0.350–4.500)

## 2015-03-08 MED ORDER — DEXTROSE 5 % IV SOLN
1.0000 g | Freq: Once | INTRAVENOUS | Status: AC
  Administered 2015-03-09: 1 g via INTRAVENOUS
  Filled 2015-03-08: qty 10

## 2015-03-08 NOTE — Progress Notes (Signed)
Quick Note:  Spoke with pt and notified of results per Dr. Wert. Pt verbalized understanding and denied any questions.  ______ 

## 2015-03-08 NOTE — ED Notes (Signed)
In/Out completed, assisted by Phyllis Ginger

## 2015-03-08 NOTE — ED Provider Notes (Signed)
CSN: [id number]     Arrival date & time 03/08/15  1948 History   First MD Initiated Contact with Patient 03/08/15 2013     Chief Complaint  Patient presents with  . Altered Mental Status  . Fever     (Consider location/radiation/quality/duration/timing/severity/associated sxs/prior Treatment) HPI Comments: 81 year old male brought by family with altered mental status and fever. Per family's report patient has been confused throughout the day today. Is up intermittent. Also for fever to 100.9. Reports chronic cough. No rash. No sick contacts. History of multiple previous UTIs  Patient is a 81 y.o. male presenting with altered mental status and fever.  Altered Mental Status Presenting symptoms: behavior changes and confusion   Severity:  Moderate Most recent episode:  Today Episode history:  Multiple Timing:  Intermittent Progression:  Unchanged Context: not dementia   Associated symptoms: fever   Associated symptoms: no rash and no weakness   Fever:    Timing:  Intermittent   Max temp PTA (F):  100.9   Temp source:  Oral Fever Associated symptoms: confusion and cough (chronic)   Associated symptoms: no chest pain, no congestion, no diarrhea and no rash     Past Medical History  Diagnosis Date  . Shortness of breath   . Hypothyroidism   . Hypertension   . Dysrhythmia     atrial fibrilation  . Arthritis   . Diabetes mellitus     type 2  . Insomnia, unspecified   . Acute bronchitis   . Other and unspecified hyperlipidemia   . Unspecified hearing loss   . Insomnia, unspecified   . Cataract    Past Surgical History  Procedure Laterality Date  . Back surgery    . Colon surgery      2001  . Colectomy  2001    Dr. Jamey Ripa   Family History  Problem Relation Age of Onset  . Heart disease Mother    History  Substance Use Topics  . Smoking status: Former Smoker -- 0.50 packs/day for 55 years    Types: Cigarettes, Cigars    Quit date: 01/14/1994  . Smokeless  tobacco: Never Used  . Alcohol Use: No    Review of Systems  Constitutional: Positive for fever.  HENT: Negative for congestion.   Respiratory: Positive for cough (chronic).   Cardiovascular: Negative for chest pain.  Gastrointestinal: Negative for diarrhea.  Musculoskeletal: Negative for neck pain.  Skin: Negative for rash.  Neurological: Negative for weakness.  Psychiatric/Behavioral: Positive for confusion.      Allergies  Amiodarone  Home Medications   Prior to Admission medications   Medication Sig Start Date End Date Taking? Authorizing Provider  cetirizine (ZYRTEC) 10 MG tablet Take 10 mg by mouth daily. Take 1 tablet daily for allergies.   Yes Historical Provider, MD  COUMADIN 5 MG tablet TAKE 1 TABLET DAILY Patient taking differently: No sig reported 11/16/14  Yes Mihai Croitoru, MD  dronedarone (MULTAQ) 400 MG tablet Take 1 tablet (400 mg total) by mouth 2 (two) times daily with a meal. 11/02/14  Yes Mihai Croitoru, MD  HYDROcodone-acetaminophen (NORCO/VICODIN) 5-325 MG per tablet Take 1 tablet by mouth every 6 (six) hours as needed for moderate pain.   Yes Historical Provider, MD  lovastatin (MEVACOR) 40 MG tablet Take 40 mg by mouth at bedtime. Take 40 mg daily to control cholesterol.   Yes Historical Provider, MD  metFORMIN (GLUCOPHAGE) 500 MG tablet Take 500 mg by mouth 2 (two) times daily with a meal.  Yes Historical Provider, MD  nitrofurantoin, macrocrystal-monohydrate, (MACROBID) 100 MG capsule Take 100 mg by mouth at bedtime. 11/09/14  Yes Historical Provider, MD  polyethylene glycol (MIRALAX / GLYCOLAX) packet Take 17 g by mouth daily.   Yes Historical Provider, MD  traZODone (DESYREL) 50 MG tablet Take 50 mg by mouth at bedtime.   Yes Historical Provider, MD  lansoprazole (PREVACID) 3 mg/ml SUSP oral suspension 2 tsp Take 30- 60 min before your first and last meals of the day Patient not taking: Reported on 03/08/2015 03/06/15   Nyoka Cowden, MD   BP 130/83  mmHg  Pulse 73  Temp(Src) 98.5 F (36.9 C) (Oral)  Resp 19  SpO2 98% Physical Exam  Constitutional: He appears well-developed.  HENT:  Head: Normocephalic and atraumatic.  Eyes: Pupils are equal, round, and reactive to light.  Cardiovascular: Normal rate.   Pulmonary/Chest: Effort normal.  Main clear to auscultation bilaterally  Abdominal: Soft. He exhibits no distension. There is no tenderness.  Musculoskeletal: He exhibits no tenderness.  Neurological: No cranial nerve deficit. He exhibits normal muscle tone.  Patient slightly confused however GCS 14  Skin: No rash noted.  Vitals reviewed.   ED Course  Procedures (including critical care time) Labs Review Labs Reviewed  CBC WITH DIFFERENTIAL/PLATELET - Abnormal; Notable for the following:    WBC 11.7 (*)    RBC 3.43 (*)    Hemoglobin 10.2 (*)    HCT 31.2 (*)    Neutrophils Relative % 82 (*)    Neutro Abs 9.6 (*)    Lymphocytes Relative 8 (*)    Monocytes Absolute 1.1 (*)    All other components within normal limits  BASIC METABOLIC PANEL - Abnormal; Notable for the following:    Glucose, Bld 121 (*)    Creatinine, Ser 1.35 (*)    GFR calc non Af Amer 45 (*)    GFR calc Af Amer 52 (*)    All other components within normal limits  URINALYSIS, ROUTINE W REFLEX MICROSCOPIC - Abnormal; Notable for the following:    APPearance CLOUDY (*)    Leukocytes, UA SMALL (*)    All other components within normal limits  URINE MICROSCOPIC-ADD ON - Abnormal; Notable for the following:    Bacteria, UA MANY (*)    All other components within normal limits  GRAM STAIN  URINE CULTURE  LACTIC ACID, PLASMA  TSH  I-STAT CG4 LACTIC ACID, ED    Imaging Review Dg Chest 2 View  03/08/2015   CLINICAL DATA:  Fever. Altered mental status. Hypertension. Diabetes.  EXAM: CHEST  2 VIEW  COMPARISON:  03/06/2015  FINDINGS: Normal heart size and pulmonary vascularity. No focal airspace disease or consolidation in the lungs. No blunting of  costophrenic angles. No pneumothorax. Mediastinal contours appear intact. Calcified and tortuous aorta. Degenerative changes in the spine and shoulders.  IMPRESSION: No active cardiopulmonary disease.   Electronically Signed   By: Burman Nieves M.D.   On: 03/08/2015 22:04     EKG Interpretation   Date/Time:  Wednesday Mar 08 2015 21:00:46 EDT Ventricular Rate:  76 PR Interval:  197 QRS Duration: 104 QT Interval:  494 QTC Calculation: 555 R Axis:   26 Text Interpretation:  Age not entered, assumed to be  81 years old for  purpose of ECG interpretation Sinus rhythm Atrial premature complexes  Borderline repolarization abnormality Prolonged QT interval Since last  tracing Nonspecific T wave abnormality NOW PRESENT Confirmed by MILLER   MD, BRIAN ([id number])  on 03/08/2015 11:04:57 PM      MDM  81 year old male presented with complaints of fever and altered mental status. Status began last night's been intermittent and worsening since approximately noon. Wife also reports fever to 100.9. On arrival patient is GCS of 14-15. Awake appropriately alert. Only slightly confused. Physical exam is unremarkable. Labs obtained patient with slight leukocytosis. BMP unremarkable. Patient does self catheter and has history of UTIs. UA showed many bacteria and small leukocytes. Given history of UTIs, self cathetering and UA showing any bacteria covered with Rocephin. Urine culture sent. Patient was complaining of some nausea and was given IV fluids and IV Zofran. Remainder of his workup unremarkable. Lactate normal. Chest x-ray normal. No skin findings consistent with cellulitis. Given patient's slight alteration in mental status also obtain a CT head. Discussed with hospitalist. We will admit for altered mental status and UTI if head CT normal. Pt care transferred to Dr. Patria Mane at 12am pending CT scan and admit.    Final diagnoses:  None        Bridgett Larsson, MD 03/09/15 0006  Eber Hong, MD 03/09/15  [id number]

## 2015-03-08 NOTE — ED Provider Notes (Signed)
I saw and evaluated the patient, reviewed the resident's note and I agree with the findings and plan.  Pertinent History: The patient is an 81 year old male, several admissions in the past, of them has been a urinary tract infection, family reports that he has not been himself today, has had increased altered mental status, his wife took his temperature and it was 100.9, he has had a chronic cough, recent evaluation by pulmonology without acute findings. He has not had any vomiting, rashes, diarrhea, he has had altered mental status during which time he gets up at odd hours doing things such as shaving at 4:00 in the morning. He is "talking out of his head" Pertinent Exam findings: On exam the patient has a soft nontender abdomen, clear heart and lung sounds, no rales, no wheezing, no increased work of breathing, no rashes, no peripheral edema, patient is awake, he is able to follow commands but says things that don't make any sense. The family states this is very abnormal.  I personally interpreted the EKG as well as the resident and agree with the interpretation on the resident's chart.   EKG Interpretation  Date/Time:  Wednesday Mar 08 2015 21:00:46 EDT Ventricular Rate:  76 PR Interval:  197 QRS Duration: 104 QT Interval:  494 QTC Calculation: 555 R Axis:   26 Text Interpretation:  Age not entered, assumed to be  81 years old for purpose of ECG interpretation Sinus rhythm Atrial premature complexes Borderline repolarization abnormality Prolonged QT interval Since last tracing Nonspecific T wave abnormality NOW PRESENT Confirmed by Hyacinth Meeker  MD, Janiah Devinney ([id number]) on 03/08/2015 11:04:57 PM       Final diagnoses:  UTI (lower urinary tract infection)      Eber Hong, MD 03/09/15 2156

## 2015-03-08 NOTE — Telephone Encounter (Signed)
[facility] faxed PA request for [employer] approval # [id number] Valid 02/06/15-10/27/2098 Called pharmacy and it did not go thru They will try again tomorrow as it may take a couple of hours.

## 2015-03-08 NOTE — ED Notes (Signed)
Pt presents to ED via EMS for altered menta status and fever since noon today. Wife reports fever 100.9 and given tylenol.

## 2015-03-09 ENCOUNTER — Emergency Department (HOSPITAL_COMMUNITY): Payer: Medicare Other

## 2015-03-09 ENCOUNTER — Encounter (HOSPITAL_COMMUNITY): Payer: Self-pay

## 2015-03-09 DIAGNOSIS — R251 Tremor, unspecified: Secondary | ICD-10-CM | POA: Diagnosis not present

## 2015-03-09 DIAGNOSIS — I10 Essential (primary) hypertension: Secondary | ICD-10-CM | POA: Diagnosis present

## 2015-03-09 DIAGNOSIS — N401 Enlarged prostate with lower urinary tract symptoms: Secondary | ICD-10-CM | POA: Diagnosis present

## 2015-03-09 DIAGNOSIS — F0390 Unspecified dementia without behavioral disturbance: Secondary | ICD-10-CM | POA: Diagnosis present

## 2015-03-09 DIAGNOSIS — R410 Disorientation, unspecified: Secondary | ICD-10-CM | POA: Diagnosis present

## 2015-03-09 DIAGNOSIS — N400 Enlarged prostate without lower urinary tract symptoms: Secondary | ICD-10-CM

## 2015-03-09 DIAGNOSIS — W19XXXA Unspecified fall, initial encounter: Secondary | ICD-10-CM | POA: Diagnosis present

## 2015-03-09 DIAGNOSIS — G9340 Encephalopathy, unspecified: Secondary | ICD-10-CM | POA: Diagnosis present

## 2015-03-09 DIAGNOSIS — Z7901 Long term (current) use of anticoagulants: Secondary | ICD-10-CM

## 2015-03-09 DIAGNOSIS — E43 Unspecified severe protein-calorie malnutrition: Secondary | ICD-10-CM | POA: Diagnosis present

## 2015-03-09 DIAGNOSIS — G20 Parkinson's disease: Secondary | ICD-10-CM | POA: Diagnosis present

## 2015-03-09 DIAGNOSIS — E785 Hyperlipidemia, unspecified: Secondary | ICD-10-CM | POA: Diagnosis present

## 2015-03-09 DIAGNOSIS — F329 Major depressive disorder, single episode, unspecified: Secondary | ICD-10-CM | POA: Diagnosis present

## 2015-03-09 DIAGNOSIS — R1310 Dysphagia, unspecified: Secondary | ICD-10-CM

## 2015-03-09 DIAGNOSIS — Z6822 Body mass index (BMI) 22.0-22.9, adult: Secondary | ICD-10-CM | POA: Diagnosis not present

## 2015-03-09 DIAGNOSIS — E1169 Type 2 diabetes mellitus with other specified complication: Secondary | ICD-10-CM | POA: Diagnosis not present

## 2015-03-09 DIAGNOSIS — Z87891 Personal history of nicotine dependence: Secondary | ICD-10-CM | POA: Diagnosis not present

## 2015-03-09 DIAGNOSIS — G4700 Insomnia, unspecified: Secondary | ICD-10-CM | POA: Diagnosis present

## 2015-03-09 DIAGNOSIS — Y92009 Unspecified place in unspecified non-institutional (private) residence as the place of occurrence of the external cause: Secondary | ICD-10-CM | POA: Diagnosis not present

## 2015-03-09 DIAGNOSIS — F419 Anxiety disorder, unspecified: Secondary | ICD-10-CM | POA: Diagnosis present

## 2015-03-09 DIAGNOSIS — N390 Urinary tract infection, site not specified: Secondary | ICD-10-CM | POA: Diagnosis present

## 2015-03-09 DIAGNOSIS — R339 Retention of urine, unspecified: Secondary | ICD-10-CM | POA: Diagnosis present

## 2015-03-09 DIAGNOSIS — H9190 Unspecified hearing loss, unspecified ear: Secondary | ICD-10-CM | POA: Diagnosis present

## 2015-03-09 DIAGNOSIS — I480 Paroxysmal atrial fibrillation: Secondary | ICD-10-CM | POA: Diagnosis present

## 2015-03-09 DIAGNOSIS — E118 Type 2 diabetes mellitus with unspecified complications: Secondary | ICD-10-CM

## 2015-03-09 DIAGNOSIS — R627 Adult failure to thrive: Secondary | ICD-10-CM | POA: Diagnosis not present

## 2015-03-09 DIAGNOSIS — B9620 Unspecified Escherichia coli [E. coli] as the cause of diseases classified elsewhere: Secondary | ICD-10-CM | POA: Diagnosis present

## 2015-03-09 DIAGNOSIS — Z515 Encounter for palliative care: Secondary | ICD-10-CM | POA: Diagnosis not present

## 2015-03-09 DIAGNOSIS — M1990 Unspecified osteoarthritis, unspecified site: Secondary | ICD-10-CM | POA: Diagnosis present

## 2015-03-09 DIAGNOSIS — E039 Hypothyroidism, unspecified: Secondary | ICD-10-CM | POA: Diagnosis present

## 2015-03-09 DIAGNOSIS — E119 Type 2 diabetes mellitus without complications: Secondary | ICD-10-CM | POA: Diagnosis present

## 2015-03-09 DIAGNOSIS — Z66 Do not resuscitate: Secondary | ICD-10-CM | POA: Diagnosis present

## 2015-03-09 LAB — CBC
HCT: 33 % — ABNORMAL LOW (ref 39.0–52.0)
Hemoglobin: 10.7 g/dL — ABNORMAL LOW (ref 13.0–17.0)
MCH: 29.6 pg (ref 26.0–34.0)
MCHC: 32.4 g/dL (ref 30.0–36.0)
MCV: 91.2 fL (ref 78.0–100.0)
PLATELETS: 167 10*3/uL (ref 150–400)
RBC: 3.62 MIL/uL — ABNORMAL LOW (ref 4.22–5.81)
RDW: 14.4 % (ref 11.5–15.5)
WBC: 13.1 10*3/uL — AB (ref 4.0–10.5)

## 2015-03-09 LAB — BASIC METABOLIC PANEL
Anion gap: 10 (ref 5–15)
BUN: 16 mg/dL (ref 6–20)
CHLORIDE: 107 mmol/L (ref 101–111)
CO2: 22 mmol/L (ref 22–32)
Calcium: 9.5 mg/dL (ref 8.9–10.3)
Creatinine, Ser: 1.07 mg/dL (ref 0.61–1.24)
GFR calc Af Amer: >60 mL/min (ref >60)
GFR calc non Af Amer: 59 mL/min — ABNORMAL LOW (ref >60)
GLUCOSE: 89 mg/dL (ref 65–99)
POTASSIUM: 3.7 mmol/L (ref 3.5–5.1)
Sodium: 139 mmol/L (ref 135–145)

## 2015-03-09 LAB — I-STAT CG4 LACTIC ACID, ED: Lactic Acid, Venous: 0.99 mmol/L (ref 0.5–2.0)

## 2015-03-09 LAB — PROTIME-INR
INR: 3.05 — ABNORMAL HIGH (ref 0.00–1.49)
Prothrombin Time: 31.8 seconds — ABNORMAL HIGH (ref 11.6–15.2)

## 2015-03-09 MED ORDER — POLYETHYLENE GLYCOL 3350 17 G PO PACK
17.0000 g | PACK | Freq: Every day | ORAL | Status: DC
  Administered 2015-03-09 – 2015-03-12 (×3): 17 g via ORAL
  Filled 2015-03-09 (×4): qty 1

## 2015-03-09 MED ORDER — BOOST PLUS PO LIQD
237.0000 mL | Freq: Three times a day (TID) | ORAL | Status: DC
  Administered 2015-03-09 – 2015-03-12 (×6): 237 mL via ORAL
  Filled 2015-03-09 (×20): qty 237

## 2015-03-09 MED ORDER — SODIUM CHLORIDE 0.9 % IJ SOLN: 3.0000 mL | INTRAMUSCULAR | Status: DC | PRN

## 2015-03-09 MED ORDER — ALUM & MAG HYDROXIDE-SIMETH 200-200-20 MG/5ML PO SUSP
30.0000 mL | Freq: Four times a day (QID) | ORAL | Status: DC | PRN
Start: 2015-03-09 — End: 2015-03-10

## 2015-03-09 MED ORDER — LORATADINE 10 MG PO TABS
10.0000 mg | ORAL_TABLET | Freq: Every day | ORAL | Status: DC
  Administered 2015-03-09 – 2015-03-13 (×5): 10 mg via ORAL
  Filled 2015-03-09 (×6): qty 1

## 2015-03-09 MED ORDER — OXYCODONE HCL 5 MG PO TABS: 5.0000 mg | ORAL_TABLET | ORAL | Status: DC | PRN

## 2015-03-09 MED ORDER — ACETAMINOPHEN 325 MG PO TABS: 650.0000 mg | ORAL_TABLET | Freq: Four times a day (QID) | ORAL | Status: DC | PRN

## 2015-03-09 MED ORDER — ONDANSETRON HCL 4 MG PO TABS: 4.0000 mg | ORAL_TABLET | Freq: Four times a day (QID) | ORAL | Status: DC | PRN

## 2015-03-09 MED ORDER — WARFARIN - PHARMACIST DOSING INPATIENT
Freq: Every day | Status: DC
  Administered 2015-03-09 – 2015-03-12 (×2)

## 2015-03-09 MED ORDER — DEXTROSE 5 % IV SOLN
1.0000 g | INTRAVENOUS | Status: DC
  Administered 2015-03-09 – 2015-03-13 (×5): 1 g via INTRAVENOUS
  Filled 2015-03-09 (×6): qty 10

## 2015-03-09 MED ORDER — DRONEDARONE HCL 400 MG PO TABS
400.0000 mg | ORAL_TABLET | Freq: Two times a day (BID) | ORAL | Status: DC
  Administered 2015-03-09 – 2015-03-14 (×10): 400 mg via ORAL
  Filled 2015-03-09 (×15): qty 1

## 2015-03-09 MED ORDER — HYDROMORPHONE HCL 1 MG/ML IJ SOLN: 0.5000 mg | INTRAMUSCULAR | Status: DC | PRN

## 2015-03-09 MED ORDER — SODIUM CHLORIDE 0.9 % IV SOLN: 250.0000 mL | INTRAVENOUS | Status: DC | PRN

## 2015-03-09 MED ORDER — ACETAMINOPHEN 650 MG RE SUPP: 650.0000 mg | Freq: Four times a day (QID) | RECTAL | Status: DC | PRN

## 2015-03-09 MED ORDER — WARFARIN SODIUM 2.5 MG PO TABS
2.5000 mg | ORAL_TABLET | Freq: Once | ORAL | Status: AC
  Administered 2015-03-09: 2.5 mg via ORAL
  Filled 2015-03-09: qty 1

## 2015-03-09 MED ORDER — ONDANSETRON HCL 4 MG/2ML IJ SOLN: 4.0000 mg | Freq: Four times a day (QID) | INTRAMUSCULAR | Status: DC | PRN

## 2015-03-09 MED ORDER — SODIUM CHLORIDE 0.9 % IJ SOLN
3.0000 mL | Freq: Two times a day (BID) | INTRAMUSCULAR | Status: DC
  Administered 2015-03-09 – 2015-03-12 (×3): 3 mL via INTRAVENOUS

## 2015-03-09 MED ORDER — PRAVASTATIN SODIUM 40 MG PO TABS
40.0000 mg | ORAL_TABLET | Freq: Every day | ORAL | Status: DC
  Administered 2015-03-09 – 2015-03-12 (×3): 40 mg via ORAL
  Filled 2015-03-09 (×6): qty 1

## 2015-03-09 MED ORDER — STARCH (THICKENING) PO POWD
ORAL | Status: DC | PRN
  Filled 2015-03-09: qty 227

## 2015-03-09 MED ORDER — SODIUM CHLORIDE 0.9 % IJ SOLN
3.0000 mL | Freq: Two times a day (BID) | INTRAMUSCULAR | Status: DC
  Administered 2015-03-09 – 2015-03-14 (×4): 3 mL via INTRAVENOUS

## 2015-03-09 NOTE — Progress Notes (Signed)
Utilization review completed.  

## 2015-03-09 NOTE — Progress Notes (Signed)
Called ED to get report on patient, nurse to call me back

## 2015-03-09 NOTE — Evaluation (Signed)
Occupational Therapy Evaluation Patient Details Name: Ronald Payne MRN: [id number] DOB: 1923-10-03 Today's Date: 03/09/2015    History of Present Illness 81 y.o. male with h/o a fib, HTN, DM, urinary retention -self catheterizes, chronic dysphagia admitted with confusion, fever, chills. Dx of acute encephalopathy 2* UTI.    Clinical Impression   PTA pt lived at home with his wife, who provided assistance with ADLs. Pt currently limited by generalized weakness and c/o tremor in UEs (R>L). Pt will benefit from acute OT to improve functional mobility and functional use of UEs. Recommend HHOT to promote independence at home.     Follow Up Recommendations  Home health OT;Supervision/Assistance - 24 hour    Equipment Recommendations  Tub/shower bench;3 in 1 bedside comode    Recommendations for Other Services       Precautions / Restrictions Precautions Precautions: Fall Precaution Comments: wife reports 1 fall in past year Restrictions Weight Bearing Restrictions: No      Mobility Bed Mobility Overal bed mobility: Needs Assistance Bed Mobility: Rolling;Sidelying to Sit Rolling: Mod assist Sidelying to sit: Mod assist       General bed mobility comments: Pt sitting up in recliner when OT arrived  Transfers Overall transfer level: Needs assistance Equipment used: Rolling walker (2 wheeled) Transfers: Sit to/from Stand Sit to Stand: Min assist         General transfer comment: MIn A to power up from recliner    Balance Overall balance assessment: History of Falls   Sitting balance-Leahy Scale: Good     Standing balance support: Bilateral upper extremity supported Standing balance-Leahy Scale: Poor                              ADL Overall ADL's : Needs assistance/impaired Eating/Feeding: Set up;Sitting   Grooming: Set up;Sitting;Wash/dry face   Upper Body Bathing: Set up;Sitting   Lower Body Bathing: Moderate assistance;Sit to/from  stand   Upper Body Dressing : Set up;Sitting   Lower Body Dressing: Maximal assistance;Sit to/from stand   Toilet Transfer: Minimal assistance;Ambulation;RW             General ADL Comments: Pt and wife report tremors beginning about 6 months ago (along with pt drooling). Tremors worse in RUE than LUE but not interfering with pt's self-care at this time. Wife assists with ADLs at home. Pt/wife report that they manage well, however pt has difficulty stepping over tub. Educated family on tub bench and wife is interested in Technical brewer.      Vision Additional Comments: Pt reports no change from baseline.           Pertinent Vitals/Pain Pain Assessment: No/denies pain     Hand Dominance Right   Extremity/Trunk Assessment Upper Extremity Assessment Upper Extremity Assessment: Generalized weakness   Lower Extremity Assessment Lower Extremity Assessment: Defer to PT evaluation   Cervical / Trunk Assessment Cervical / Trunk Assessment: Kyphotic (baseline)   Communication Communication Communication: HOH;Expressive difficulties (speech slurred/gurgly sounding at times)   Cognition Arousal/Alertness: Awake/alert Behavior During Therapy: WFL for tasks assessed/performed Overall Cognitive Status: Impaired/Different from baseline Area of Impairment: Memory     Memory: Decreased short-term memory         General Comments: Pt's wife corrected some information during session due to decreased memory              Home Living Family/patient expects to be discharged to:: Private residence Living Arrangements: Spouse/significant other;Children;Other relatives Available  Help at Discharge: Family;Available 24 hours/day Type of Home: House Home Access: Stairs to enter [employer] of Steps: 2 Entrance Stairs-Rails: None Home Layout: One level     Bathroom Shower/Tub: Chief Strategy Officer: Standard     Home Equipment: Walker - 2 wheels;Grab bars -  toilet;Walker - 4 wheels;Grab bars - tub/shower   Additional Comments: Wife assists husband      Prior Functioning/Environment Level of Independence: Needs assistance  Gait / Transfers Assistance Needed: uses rollator in home independently ADL's / Homemaking Assistance Needed: wife assists with bathing/dressing Communication / Swallowing Assistance Needed: thickened liquids      OT Diagnosis: Generalized weakness;Altered mental status   OT Problem List: Decreased strength;Decreased activity tolerance;Impaired balance (sitting and/or standing);Decreased cognition;Decreased coordination;Decreased knowledge of use of DME or AE;Impaired UE functional use   OT Treatment/Interventions: Self-care/ADL training;Therapeutic exercise;Energy conservation;DME and/or AE instruction;Therapeutic activities;Patient/family education;Balance training;Cognitive remediation/compensation    OT Goals(Current goals can be found in the care plan section) Acute Rehab OT Goals Patient Stated Goal: go back home OT Goal Formulation: With patient Time For Goal Achievement: 03/23/15 Potential to Achieve Goals: Good ADL Goals Pt Will Perform Eating: with modified independence;sitting Pt Will Transfer to Toilet: with supervision;ambulating;bedside commode  OT Frequency: Min 2X/week    End of Session Equipment Utilized During Treatment: Gait belt;Rolling walker  Activity Tolerance: Patient tolerated treatment well Patient left: in chair;with call bell/phone within reach;with family/visitor present;with nursing/sitter in room   Time: 1452-1516 OT Time Calculation (min): 24 min Charges:  OT General Charges $OT Visit: 1 Procedure OT Evaluation $Initial OT Evaluation Tier I: 1 Procedure OT Treatments $Self Care/Home Management : 8-22 mins G-Codes:    Rae Lips 03-10-2015, 3:32 PM  Carney Living, OTR/L Occupational Therapist [phone] (pager)

## 2015-03-09 NOTE — Plan of Care (Signed)
Problem: Consults Goal: Nutrition Consult-if indicated Outcome: Completed/Met Date Met:  03/09/15 Started on Boost plus today  Problem: Phase I Progression Outcomes Goal: OOB as tolerated unless otherwise ordered Outcome: Completed/Met Date Met:  03/09/15 OOB to chair today Goal: Initial discharge plan identified Outcome: Completed/Met Date Met:  03/09/15 To return home with wife with H/H PT/OT & DME

## 2015-03-09 NOTE — Evaluation (Addendum)
Physical Therapy Evaluation Patient Details Name: Ronald Payne MRN: [id number] DOB: 04/10/1923 Today's Date: 03/09/2015   History of Present Illness  81 y.o. male with h/o a fib, HTN, DM, urinary retention -self catheterizes, chronic dysphagia admitted with confusion, fever, chills. Dx of acute encephalopathy 2* UTI.   Clinical Impression  Pt admitted with above diagnosis. Pt currently with functional limitations due to the deficits listed below (see PT Problem List). Pt ambulated 6' with RW with min A. HR up to 140 with walking, 120 at rest. Per wife he is at or near baseline with mobility and she feels she can manage his care at home.  Pt will benefit from skilled PT to increase their independence and safety with mobility to allow discharge to the venue listed below.       Follow Up Recommendations Home health PT    Equipment Recommendations  None recommended by PT    Recommendations for Other Services OT consult     Precautions / Restrictions Precautions Precautions: Fall Precaution Comments: wife reports 1 fall in past year Restrictions Weight Bearing Restrictions: No      Mobility  Bed Mobility Overal bed mobility: Needs Assistance Bed Mobility: Rolling;Sidelying to Sit Rolling: Mod assist Sidelying to sit: Mod assist       General bed mobility comments: assist to initiate roll, assist to raise trunk  Transfers Overall transfer level: Needs assistance Equipment used: Rolling walker (2 wheeled) Transfers: Sit to/from Stand Sit to Stand: From elevated surface;Min assist         General transfer comment: min A to power up from elevated bed  Ambulation/Gait Ambulation/Gait assistance: Min guard;Min assist Ambulation Distance (Feet): 80 Feet Assistive device: Rolling walker (2 wheeled) Gait Pattern/deviations: Decreased stride length;Trunk flexed;Step-through pattern;Decreased step length - right;Decreased step length - left     General Gait Details:  flexed trunk and neck flexed and rotated R, per wife this is baseline, steady with RW, Min A to turn RW (pt used to rollator with swivel wheels), no LOB, HR up to 140 while walking  Stairs            Wheelchair Mobility    Modified Rankin (Stroke Patients Only)       Balance Overall balance assessment: Needs assistance;History of Falls   Sitting balance-Leahy Scale: Good     Standing balance support: Bilateral upper extremity supported Standing balance-Leahy Scale: Poor                               Pertinent Vitals/Pain Pain Assessment: No/denies pain    Home Living Family/patient expects to be discharged to:: Private residence Living Arrangements: Spouse/significant other;Children;Other relatives Available Help at Discharge: Family;Available 24 hours/day Type of Home: House Home Access: Stairs to enter Entrance Stairs-Rails: None   Home Layout: One level Home Equipment: Walker - 2 wheels;Grab bars - toilet;Walker - 4 wheels;Grab bars - tub/shower Additional Comments: Wife assists husband    Prior Function Level of Independence: Needs assistance   Gait / Transfers Assistance Needed: uses rollator in home independently  ADL's / Homemaking Assistance Needed: wife assists with bathing/dressing and with catheterization        Hand Dominance   Dominant Hand: Right    Extremity/Trunk Assessment   Upper Extremity Assessment: Defer to OT evaluation           Lower Extremity Assessment: Generalized weakness (B knee extension -10* AROM, knee ext strength -4/5, ankles WNL,  sensation intact to light touch)      Cervical / Trunk Assessment: Kyphotic (forward head in sitting and standing and unable to correct with verbal cues, trunk also flexed, wife stated this is baseline)  Communication   Communication: HOH;Expressive difficulties (speech slurred at times, with low volume)  Cognition Arousal/Alertness: Awake/alert Behavior During Therapy: WFL  for tasks assessed/performed Overall Cognitive Status: Impaired/Different from baseline Area of Impairment: Memory     Memory: Decreased short-term memory         General Comments: follows commands well, some memory issues -pt stated he walks at [employer] but wife stated it's been awhile since he's been able to do that    General Comments      Exercises        Assessment/Plan    PT Assessment Patient needs continued PT services  PT Diagnosis Generalized weakness   PT Problem List Decreased strength;Decreased mobility;Decreased range of motion;Decreased activity tolerance;Decreased balance;Decreased cognition;Cardiopulmonary status limiting activity  PT Treatment Interventions Gait training;Functional mobility training;Therapeutic activities;Patient/family education;Balance training;Therapeutic exercise   PT Goals (Current goals can be found in the Care Plan section) Acute Rehab PT Goals Patient Stated Goal: return home PT Goal Formulation: With patient/family Time For Goal Achievement: 03/23/15 Potential to Achieve Goals: Fair    Frequency Min 3X/week   Barriers to discharge        Co-evaluation               End of Session Equipment Utilized During Treatment: Gait belt Activity Tolerance: Treatment limited secondary to medical complications (Comment) (HR up to 140 walking, 120 at rest) Patient left: in chair;with call bell/phone within reach;with family/visitor present Nurse Communication: Mobility status         Time: 1255-1319 PT Time Calculation (min) (ACUTE ONLY): 24 min   Charges:   PT Evaluation $Initial PT Evaluation Tier I: 1 Procedure PT Treatments $Gait Training: 8-22 mins   PT G Codes:        Tamala Ser 03/09/2015, 1:36 PM [phone]

## 2015-03-09 NOTE — Progress Notes (Signed)
Patient arrived to floor room 24 from ED via stretcher, transferred to bed, SR up, call bell in reach, bed alarm set. Admission paperwork/assessment complete. Patient and wife oriented to room, call bell, bed controls, orders, patient guide and fall prevention with verbal understanding. Denies pain or problems at this time, will monitor.

## 2015-03-09 NOTE — Evaluation (Signed)
Clinical/Bedside Swallow Evaluation Patient Details  Name: Ronald Payne MRN: [id number] Date of Birth: 12-Jan-1923  Today's Date: 03/09/2015 Time: SLP Start Time (ACUTE ONLY): 1400 SLP Stop Time (ACUTE ONLY): 1435 SLP Time Calculation (min) (ACUTE ONLY): 35 min  Past Medical History:  Past Medical History  Diagnosis Date  . Shortness of breath   . Hypothyroidism   . Hypertension   . Dysrhythmia     atrial fibrilation  . Arthritis   . Diabetes mellitus     type 2  . Insomnia, unspecified   . Acute bronchitis   . Other and unspecified hyperlipidemia   . Unspecified hearing loss   . Insomnia, unspecified   . Cataract    Past Surgical History:  Past Surgical History  Procedure Laterality Date  . Back surgery    . Colon surgery      2001  . Colectomy  2001    Dr. Jamey Ripa   HPI:  Ronald Payne is a 81 y.o. male with a history of Paroxysmal Atrial Fibrillation on Coumadin Rx, HTN, DM2, Hyperlipidemia, BPH who presents to the ED with increased confusion and fever and chills. He has urinary retention and self caths himself and has frequent UTI's. He denies any history of a previous CVA but has chronic dysphagia and has had 2 MBS ince admission on 3/20 for UTI. Last MBS recommended Dys 3 diet/nectar thick liquids.  Pt is scheduled for an outpatient MBS on 5/16.    Assessment / Plan / Recommendation Clinical Impression  Pt demonstrates worsened function from known baseline, including excessive drooling, poor oral manipulation with residuals, appearance of delayed swallow and consistent wet vocal quality with all textures, indicative of penetration of liquid to the airway. Pt is at risk of aspiration and SLP made pt/family aware of this. Wife reports this has been going on for a few weeks. Recommend pt conitue current diet with risk for the evening as intake is minimal and pt does not have an active pulmonary condition. Will f/u with MBS tomorrow. Would suggest neurology consult  given possiblity of neuromuscular impairment.     Aspiration Risk       Diet Recommendation  (regualr/thin)   Medication Administration: Crushed with puree Compensations: Slow rate;Small sips/bites;Multiple dry swallows after each bite/sip    Other  Recommendations Oral Care Recommendations: Oral care BID Other Recommendations: Have oral suction available   Follow Up Recommendations       Frequency and Duration        Pertinent Vitals/Pain NA    SLP Swallow Goals     Swallow Study Prior Functional Status  Type of Home: House Available Help at Discharge: Family;Available 24 hours/day    General Other Pertinent Information: Ronald Payne is a 81 y.o. male with a history of Paroxysmal Atrial Fibrillation on Coumadin Rx, HTN, DM2, Hyperlipidemia, BPH who presents to the ED with increased confusion and fever and chills. He has urinary retention and self caths himself and has frequent UTI's. He denies any history of a previous CVA but has chronic dysphagia and has had 2 MBS ince admission on 3/20 for UTI. Last MBS recommended Dys 3 diet/nectar thick liquids.  Pt is scheduled for an outpatient MBS on 5/16.  Type of Study: Bedside swallow evaluation Previous Swallow Assessment: see HPI Diet Prior to this Study: Regular;Thin liquids Temperature Spikes Noted: No Respiratory Status: Room air History of Recent Intubation: No Behavior/Cognition: Alert;Cooperative;Pleasant mood Oral Cavity - Dentition:  (dentures) Self-Feeding Abilities: Able to feed  self;Needs set up Patient Positioning: Upright in chair/Tumbleform Baseline Vocal Quality: Wet Volitional Cough: Strong Volitional Swallow: Able to elicit    Oral/Motor/Sensory Function Overall Oral Motor/Sensory Function: Other (comment) (sluggish)   Ice Chips     Thin Liquid Thin Liquid: Not tested    Nectar Thick Nectar Thick Liquid: Impaired Presentation: Cup Oral Phase Impairments: Impaired anterior to posterior  transit;Reduced lingual movement/coordination Oral phase functional implications: Prolonged oral transit Pharyngeal Phase Impairments: Suspected delayed Swallow;Wet Vocal Quality;Cough - Delayed   Honey Thick Honey Thick Liquid: Not tested   Puree Puree: Impaired Presentation: Spoon;Self Fed Oral Phase Impairments: Impaired anterior to posterior transit;Reduced lingual movement/coordination Oral Phase Functional Implications: Prolonged oral transit;Oral residue Pharyngeal Phase Impairments: Suspected delayed Swallow;Throat Clearing - Delayed;Wet Vocal Quality   Solid   GO    Solid: Impaired Presentation: Self Fed Oral Phase Impairments: Impaired mastication;Impaired anterior to posterior transit;Reduced lingual movement/coordination Oral Phase Functional Implications: Oral residue;Left lateral sulci pocketing Pharyngeal Phase Impairments: Suspected delayed Swallow;Wet Vocal Quality;Cough - Delayed      Harlon Ditty, MA CCC-SLP [phone]  Claudine Mouton 03/09/2015,2:56 PM

## 2015-03-09 NOTE — Progress Notes (Signed)
ANTICOAGULATION CONSULT NOTE - Initial Consult  Pharmacy Consult for Coumadin Indication: atrial fibrillation  Allergies  Allergen Reactions  . Amiodarone Other (See Comments)    Hyperthyroid on Amio in the past, changed to Multaq    Patient Measurements: Height: 6' (182.9 cm) Weight: 166 lb 0.1 oz (75.3 kg) IBW/kg (Calculated) : 77.6  Vital Signs: Temp: 99.5 F (37.5 C) (05/12 1051) Temp Source: Oral (05/12 1051) BP: 95/72 mmHg (05/12 1051) Pulse Rate: 122 (05/12 1051)  Labs:  Recent Labs  03/06/15 1352 03/08/15 2037 03/09/15 0042 03/09/15 0555 03/09/15 1010  HGB  --  10.2*  --   --  10.7*  HCT  --  31.2*  --   --  33.0*  PLT  --  181  --   --  167  LABPROT  --   --  31.8*  --   --   INR 2.6  --  3.05*  --   --   CREATININE  --  1.35*  --  1.07  --     Estimated Creatinine Clearance: 49.8 mL/min (by C-G formula based on Cr of 1.07).   Medical History: Past Medical History  Diagnosis Date  . Shortness of breath   . Hypothyroidism   . Hypertension   . Dysrhythmia     atrial fibrilation  . Arthritis   . Diabetes mellitus     type 2  . Insomnia, unspecified   . Acute bronchitis   . Other and unspecified hyperlipidemia   . Unspecified hearing loss   . Insomnia, unspecified   . Cataract     Assessment: 89 YOM on coumadin PTA admitted for increased confusion, fever and chills. Head CT- no acute intracranial abnormalities. Pharmacy is consulted to manage coumadin dosing. INR 3.05 high-end therapeutic goal, last dose on 5/10. hgb 10.7, plt wnl.  PTA dose: 5mg  on TTSun, 2.5mg  on all other days.   Goal of Therapy:  INR 2-3 Monitor platelets by anticoagulation protocol: Yes   Plan:  - Coumadin 2.5 mg po x 1 - Daily PT/INR  Bayard Hugger, PharmD, BCPS  Clinical Pharmacist  Pager: [phone]   03/09/2015,11:51 AM

## 2015-03-09 NOTE — H&P (Signed)
[facility] Admission History and Physical       Ronald Payne:[id number] DOB: [birth year] DOA: 03/08/2015  Referring physician: EDP PCP: Ronald Dials, MD  Specialists:   Chief Complaint: Confusion  HPI: Ronald Payne is a 81 y.o. male with a history of Paroxysmal Atrial Fibrillation on Coumadin Rx, HTN, DM2, Hyperlipidemia, BPH who presents to the ED with increased confusion since last night, and fever and chills that started at noon today.   He has urinary retention and self caths himself and has frequent UTI's.  He denies any history of  a previous CVA but has chronic dysphagia and is on a soft diet at home and has to have his medications by liquid or crushed.   He also drinks thickened liquids, his wife reports that he is scheduled to have a swallowing study.     Review of Systems:  Constitutional: No Weight Loss, No Weight Gain, Night Sweats,+Fevers, +Chills, Dizziness, Light Headedness, Fatigue, or Generalized Weakness HEENT: No Headaches, +Difficulty Swallowing,Tooth/Dental Problems,Sore Throat,  No Sneezing, Rhinitis, Ear Ache, Nasal Congestion, or Post Nasal Drip,  Cardio-vascular:  No Chest pain, Orthopnea, PND, Edema in Lower Extremities, Anasarca, Dizziness, Palpitations  Resp: No Dyspnea, No DOE, No Productive Cough, +Non-Productive Cough, No Hemoptysis, No Wheezing.    GI: No Heartburn, Indigestion, Abdominal Pain, Nausea, Vomiting, Diarrhea, Constipation, Hematemesis, Hematochezia, Melena, Change in Bowel Habits,  Loss of Appetite  GU: No Dysuria, No Change in Color of Urine, No Urgency or Urinary Frequency, No Flank pain.  Musculoskeletal: No Joint Pain or Swelling, No Decreased Range of Motion, No Back Pain.  Neurologic: No Syncope, No Seizures, Muscle Weakness, Paresthesia, Vision Disturbance or Loss, No Diplopia, No Vertigo, No Difficulty Walking,  Skin: No Rash or Lesions. Psych: No Change in Mood or Affect, No Depression or Anxiety, No Memory loss,  No Confusion, or Hallucinations   Past Medical History  Diagnosis Date  . Shortness of breath   . Hypothyroidism   . Hypertension   . Dysrhythmia     atrial fibrilation  . Arthritis   . Diabetes mellitus     type 2  . Insomnia, unspecified   . Acute bronchitis   . Other and unspecified hyperlipidemia   . Unspecified hearing loss   . Insomnia, unspecified   . Cataract      Past Surgical History  Procedure Laterality Date  . Back surgery    . Colon surgery      2001  . Colectomy  2001    Dr. Jamey Payne      Prior to Admission medications   Medication Sig Start Date End Date Taking? Authorizing Provider  cetirizine (ZYRTEC) 10 MG tablet Take 10 mg by mouth daily. Take 1 tablet daily for allergies.   Yes Historical Provider, MD  COUMADIN 5 MG tablet TAKE 1 TABLET DAILY Patient taking differently: No sig reported 11/16/14  Yes Ronald Croitoru, MD  dronedarone (MULTAQ) 400 MG tablet Take 1 tablet (400 mg total) by mouth 2 (two) times daily with a meal. 11/02/14  Yes Ronald Croitoru, MD  HYDROcodone-acetaminophen (NORCO/VICODIN) 5-325 MG per tablet Take 1 tablet by mouth every 6 (six) hours as needed for moderate pain.   Yes Historical Provider, MD  lovastatin (MEVACOR) 40 MG tablet Take 40 mg by mouth at bedtime. Take 40 mg daily to control cholesterol.   Yes Historical Provider, MD  metFORMIN (GLUCOPHAGE) 500 MG tablet Take 500 mg by mouth 2 (two) times daily with a meal.  Yes Historical Provider, MD  nitrofurantoin, macrocrystal-monohydrate, (MACROBID) 100 MG capsule Take 100 mg by mouth at bedtime. 11/09/14  Yes Historical Provider, MD  polyethylene glycol (MIRALAX / GLYCOLAX) packet Take 17 g by mouth daily.   Yes Historical Provider, MD  traZODone (DESYREL) 50 MG tablet Take 50 mg by mouth at bedtime.   Yes Historical Provider, MD  lansoprazole (PREVACID) 3 mg/ml SUSP oral suspension 2 tsp Take 30- 60 min before your first and last meals of the day Patient not taking: Reported on  03/08/2015 03/06/15   Ronald Cowden, MD     Allergies  Allergen Reactions  . Amiodarone Other (See Comments)    Hyperthyroid on Amio in the past, changed to [employer]    Social History:  Married,   reports that he quit smoking about 21 years ago. His smoking use included Cigarettes and Cigars. He has a 27.5 pack-year smoking history. He has never used smokeless tobacco. He reports that he does not drink alcohol or use illicit drugs.    Family History  Problem Relation Age of Onset  . Heart disease Mother        Physical Exam:  GEN:  Pleasant Elderly Thin 81 y.o. Caucsian male examined and in no acute distress; cooperative with exam Filed Vitals:   03/08/15 2330 03/08/15 2345 03/09/15 0000 03/09/15 0015  BP: 118/71 116/69 130/83 125/64  Pulse: 73 72 73 72  Temp:      TempSrc:      Resp: SpO2: 98% 96% 98% 99%   Blood pressure 125/64, pulse 72, temperature 98.5 F (36.9 C), temperature source Oral, resp. rate 27, SpO2 99 %. PSYCH: He is alert and oriented x4; does not appear anxious does not appear depressed; affect is normal HEENT: Normocephalic and Atraumatic, Mucous membranes pink; PERRLA; EOM intact; Fundi:  Benign;  No scleral icterus, Nares: Patent, Oropharynx: Clear, Fair Dentition,    Neck:  FROM, No Cervical Lymphadenopathy nor Thyromegaly or Carotid Bruit; No JVD; Breasts:: Not examined CHEST WALL: No tenderness CHEST: Normal respiration, clear to auscultation bilaterally HEART: Regular rate and rhythm; no murmurs rubs or gallops BACK: No kyphosis or scoliosis; No CVA tenderness ABDOMEN: Positive Bowel Sounds, Soft Non-Tender, No Rebound or Guarding; No Masses, No Organomegaly Rectal Exam: Not done EXTREMITIES: No Cyanosis, Clubbing, or Edema; No Ulcerations. Genitalia: not examined PULSES: 2+ and symmetric SKIN: Normal hydration no rash or ulceration CNS:  Alert and Oriented x 4, No Focal Deficits Vascular: pulses palpable throughout    Labs on  Admission:  Basic Metabolic Panel:  Recent Labs Lab 03/08/15 2037  NA 139  K 3.7  CL 106  CO2 23  GLUCOSE 121*  BUN 18  CREATININE 1.35*  CALCIUM 9.7   Liver Function Tests: No results for input(s): AST, ALT, ALKPHOS, BILITOT, PROT, ALBUMIN in the last 168 hours. No results for input(s): LIPASE, AMYLASE in the last 168 hours. No results for input(s): AMMONIA in the last 168 hours. CBC:  Recent Labs Lab 03/08/15 2037  WBC 11.7*  NEUTROABS 9.6*  HGB 10.2*  HCT 31.2*  MCV 91.0  PLT 181   Cardiac Enzymes: No results for input(s): CKTOTAL, CKMB, CKMBINDEX, TROPONINI in the last 168 hours.  BNP (last 3 results) No results for input(s): BNP in the last 8760 hours.  ProBNP (last 3 results) No results for input(s): PROBNP in the last 8760 hours.  CBG: No results for input(s): GLUCAP in the last 168 hours.  Radiological Exams  on Admission: Dg Chest 2 View  03/08/2015   CLINICAL DATA:  Fever. Altered mental status. Hypertension. Diabetes.  EXAM: CHEST  2 VIEW  COMPARISON:  03/06/2015  FINDINGS: Normal heart size and pulmonary vascularity. No focal airspace disease or consolidation in the lungs. No blunting of costophrenic angles. No pneumothorax. Mediastinal contours appear intact. Calcified and tortuous aorta. Degenerative changes in the spine and shoulders.  IMPRESSION: No active cardiopulmonary disease.   Electronically Signed   By: Burman Nieves M.D.   On: 03/08/2015 22:04   Ct Head Wo Contrast  03/09/2015   CLINICAL DATA:  Altered mental status and fever since noon.  EXAM: CT HEAD WITHOUT CONTRAST  TECHNIQUE: Contiguous axial images were obtained from the base of the skull through the vertex without intravenous contrast.  COMPARISON:  09/27/2014  FINDINGS: Diffuse cerebral atrophy. Ventricular dilatation consistent with central atrophy. Low-attenuation changes throughout the deep white matter consistent with small vessel ischemia. No mass effect or midline shift. No  abnormal extra-axial fluid collections. Gray-white matter junctions are distinct. Basal cisterns are not effaced. No evidence of acute intracranial hemorrhage. No depressed skull fractures. Visualized paranasal sinuses and mastoid air cells are not opacified. Vascular calcifications. Old nasal bone fractures.  IMPRESSION: No acute intracranial abnormalities. Chronic atrophy and small vessel ischemic changes.   Electronically Signed   By: Burman Nieves M.D.   On: 03/09/2015 00:50     EKG: Independently reviewed. Normal Sinus Rhythm rate = 76   Assessment/Plan:   81 y.o. male with  Principal Problem:   1.    Acute encephalopathy- Most Likely due to UTI, Improving   Neuro checks   Telemetry Monitoring       Active Problems:   2.   UTI (lower urinary tract infection)   Urine C+S sent   IV Rocephin     3.   Paroxysmal atrial fibrillation   Telemetry Monitoring   On coumadin Rx   Check PT/INR daily    Adjust Coumadin PRN   Continue Multaq (Dronedarone) Rx     4.   Chronic anticoagulation   On coumadin Rx    Monitor PT/INRs     5.   Diabetes mellitus   Hold Metformin Rx   SSI coverage PRN   Check HbA1C     6.   Dyslipidemia   Continue Statin Rx ( Mevacor)     7.   BPH (benign prostatic hyperplasia), Dr Vernie Ammons follows   Hx     8.   Dysphagia   Speech Swallow Evaluation ordered   Soft Diet with Thickened Liquids ordered     9.   Urinary retention- Due to BPH   Continue to Straight Cath every 6 hrs    10.  Hypothyroidism- not on medications currently   Check TSH Level   11. DVT Prophylaxis   On Coumadin Rx.          Code Status:     FULL CODE       Family Communication:   Wife at Bedside    Disposition Plan:    Inpatient Status        Time spent:  55 Minutes      Ron Portela [facility] Pager [phone]   If 7AM -7PM Please Contact the Day Rounding Team MD for [facility]  If 7PM-7AM, Please Contact Night-Floor Coverage    [email or web address] Password [email or web address] 03/09/2015, 1:04 AM     ADDENDUM:   Patient was seen and examined on 03/09/2015

## 2015-03-09 NOTE — Progress Notes (Signed)
Patient seen and examined, reported feeling better, though still slightly confused, not oriented to time, but to place and person, poor memory. Wife at bedside.

## 2015-03-09 NOTE — Progress Notes (Signed)
Initial Nutrition Assessment  DOCUMENTATION CODES:  Severe malnutrition in context of chronic illness  INTERVENTION:  Other (Comment) (Boost Plus TID)  NUTRITION DIAGNOSIS:  Malnutrition related to dysphagia, other (see comment) (decreased appetite) as evidenced by meal completion < 25%, per patient/family report, severe depletion of body fat, severe depletion of muscle mass.   GOAL:  Patient will meet greater than or equal to 90% of their needs   MONITOR:  PO intake, Supplement acceptance, Labs, Weight trends, I & O's, Skin  REASON FOR ASSESSMENT:  Malnutrition Screening Tool    ASSESSMENT: Ronald Payne is a 81 y.o. male with a history of Paroxysmal Atrial Fibrillation on Coumadin Rx, HTN, DM2, Hyperlipidemia, BPH who presents to the ED with increased confusion since last night, and fever and chills that started at noon today. He has urinary retention and self caths himself and has frequent UTI's. He denies any history of a previous CVA but has chronic dysphagia and is on a soft diet at home and has to have his medications by liquid or crushed. He also drinks thickened liquids, his wife reports that he is scheduled to have a swallowing study.   Pt admitted with encephaloapthy, likely related to UTI.  Hx obtained by pt and wife at bedside. Both report a general decline in health over the past year, however, noted significant decline over the past 2 weeks. Per pt wife, appetite has continued to decrease and pt has had more difficulty swallowing. Pt has been on nectar thick liquids for the past 6 months. Pt wife also reports that pt was scheduled for a MBSS on 03/13/15 per Dr. Thurston Hole orders but is hopeful that this can be done here.   Pt UBW is 182# according to wife and reports a steady decline in weight over the past 5-6 months. She also has noticed that pt has become much weaker over the past month. Nutrition focused exam revealed severe fat and muscle  depletion.  Pt drinks Boost at home and would like to continue here. Discussed importance of good meal and supplement intake to promote healing.   Height:  Ht Readings from Last 1 Encounters:  03/09/15 6' (1.829 m)    Weight:  Wt Readings from Last 1 Encounters:  03/09/15 166 lb 0.1 oz (75.3 kg)    Ideal Body Weight:  80.9 kg  Wt Readings from Last 10 Encounters:  03/09/15 166 lb 0.1 oz (75.3 kg)  03/06/15 171 lb (77.565 kg)  09/29/14 182 lb 1.6 oz (82.6 kg)  06/08/14 182 lb 9.6 oz (82.827 kg)  04/20/14 189 lb 6.4 oz (85.911 kg)  01/14/14 186 lb 11.7 oz (84.7 kg)  09/30/13 197 lb (89.359 kg)  06/03/13 199 lb (90.266 kg)  11/05/11 206 lb (93.441 kg)    BMI:  Body mass index is 22.51 kg/(m^2).  Estimated Nutritional Needs:  Kcal:  2000-2200  Protein:  90-100 grams  Fluid:  2.0-2.2 L  Skin:  Reviewed, no issues  Diet Order:  Diet heart healthy/carb modified Room service appropriate?: Yes; Fluid consistency:: Nectar Thick  EDUCATION NEEDS:  Education needs addressed   Intake/Output Summary (Last 24 hours) at 03/09/15 1500 Last data filed at 03/09/15 0700  Gross per 24 hour  Intake      0 ml  Output    300 ml  Net   -300 ml    Last BM:  03/08/15  Gerell Fortson A. Mayford Knife, RD, LDN, CDE Pager: [phone] After hours Pager: [phone]

## 2015-03-10 ENCOUNTER — Inpatient Hospital Stay (HOSPITAL_COMMUNITY): Payer: Medicare Other

## 2015-03-10 DIAGNOSIS — R251 Tremor, unspecified: Secondary | ICD-10-CM

## 2015-03-10 DIAGNOSIS — G20 Parkinson's disease: Secondary | ICD-10-CM

## 2015-03-10 DIAGNOSIS — R627 Adult failure to thrive: Secondary | ICD-10-CM

## 2015-03-10 DIAGNOSIS — R1314 Dysphagia, pharyngoesophageal phase: Secondary | ICD-10-CM

## 2015-03-10 DIAGNOSIS — E43 Unspecified severe protein-calorie malnutrition: Secondary | ICD-10-CM

## 2015-03-10 LAB — BASIC METABOLIC PANEL
Anion gap: 13 (ref 5–15)
BUN: 22 mg/dL — AB (ref 6–20)
CHLORIDE: 105 mmol/L (ref 101–111)
CO2: 20 mmol/L — ABNORMAL LOW (ref 22–32)
Calcium: 9.7 mg/dL (ref 8.9–10.3)
Creatinine, Ser: 1.39 mg/dL — ABNORMAL HIGH (ref 0.61–1.24)
GFR calc non Af Amer: 43 mL/min — ABNORMAL LOW (ref >60)
GFR, EST AFRICAN AMERICAN: 50 mL/min — AB (ref >60)
Glucose, Bld: 138 mg/dL — ABNORMAL HIGH (ref 65–99)
POTASSIUM: 4.4 mmol/L (ref 3.5–5.1)
SODIUM: 138 mmol/L (ref 135–145)

## 2015-03-10 LAB — CBC
HEMATOCRIT: 34 % — AB (ref 39.0–52.0)
Hemoglobin: 11.1 g/dL — ABNORMAL LOW (ref 13.0–17.0)
MCH: 29.5 pg (ref 26.0–34.0)
MCHC: 32.6 g/dL (ref 30.0–36.0)
MCV: 90.4 fL (ref 78.0–100.0)
Platelets: 176 10*3/uL (ref 150–400)
RBC: 3.76 MIL/uL — ABNORMAL LOW (ref 4.22–5.81)
RDW: 14.4 % (ref 11.5–15.5)
WBC: 13.6 10*3/uL — ABNORMAL HIGH (ref 4.0–10.5)

## 2015-03-10 LAB — MAGNESIUM: Magnesium: 2 mg/dL (ref 1.7–2.4)

## 2015-03-10 LAB — PROTIME-INR
INR: 4.22 — AB (ref 0.00–1.49)
Prothrombin Time: 41 seconds — ABNORMAL HIGH (ref 11.6–15.2)

## 2015-03-10 MED ORDER — METOPROLOL TARTRATE 12.5 MG HALF TABLET
12.5000 mg | ORAL_TABLET | Freq: Two times a day (BID) | ORAL | Status: DC
  Administered 2015-03-10 (×2): 12.5 mg via ORAL
  Filled 2015-03-10 (×4): qty 1

## 2015-03-10 MED ORDER — CARBIDOPA-LEVODOPA 25-100 MG PO TABS
1.0000 | ORAL_TABLET | Freq: Four times a day (QID) | ORAL | Status: DC
  Administered 2015-03-10 – 2015-03-14 (×15): 1 via ORAL
  Filled 2015-03-10 (×22): qty 1

## 2015-03-10 MED ORDER — SODIUM CHLORIDE 0.9 % IV SOLN
INTRAVENOUS | Status: AC
  Administered 2015-03-10 – 2015-03-11 (×2): via INTRAVENOUS

## 2015-03-10 NOTE — Progress Notes (Signed)
Patient continually pulling tele leads off, pulling clothes off. Very confused all night.  Mitts placed at this time. SR up, call bell in reach, bed alarm on. Will monitor.

## 2015-03-10 NOTE — Procedures (Cosign Needed)
Objective Swallowing Evaluation: Other (Comment)  Patient Details  Name: Ronald Payne MRN: [id number] Date of Birth: 03/22/1923  Today's Date: 03/10/2015 Time: SLP Start Time (ACUTE ONLY): 1020-SLP Stop Time (ACUTE ONLY): 1040 SLP Time Calculation (min) (ACUTE ONLY): 20 min  Past Medical History:  Past Medical History  Diagnosis Date  . Shortness of breath   . Hypothyroidism   . Hypertension   . Dysrhythmia     atrial fibrilation  . Arthritis   . Diabetes mellitus     type 2  . Insomnia, unspecified   . Acute bronchitis   . Other and unspecified hyperlipidemia   . Unspecified hearing loss   . Insomnia, unspecified   . Cataract    Past Surgical History:  Past Surgical History  Procedure Laterality Date  . Back surgery    . Colon surgery      2001  . Colectomy  2001    Dr. Jamey Ripa   HPI:  Other Pertinent Information: Ronald Payne is a 81 y.o. male with a history of Paroxysmal Atrial Fibrillation on Coumadin Rx, HTN, DM2, Hyperlipidemia, BPH who presents to the ED with increased confusion and fever and chills. He has urinary retention and self caths himself and has frequent UTI's. He denies any history of a previous CVA but has chronic dysphagia and has had 2 MBS ince admission on 01/14/14 for UTI. Last MBS recommended Dys 3 diet/nectar thick liquids.  Pt is scheduled for an outpatient MBS on 5/16.   No Data Recorded  Assessment / Plan / Recommendation CHL IP CLINICAL IMPRESSIONS 03/10/2015  Therapy Diagnosis Severe pharyngeal phase dysphagia;Severe oral phase dysphagia  Clinical Impression Pt demonstrates a severe to profound dysphagia, significantly worsened over the last year since last MBS. Presentation very concerning for a progressive neuromuscular disorder.   Oral function is severey impaired with weak lingual maniuplation and bolus formation with anterior spillage and oral residue (also observed with basic secretion management resulting in drooling). Swallow  resonse is delayed and grossly weak with poor bolus propulsion and airway closure with significant sensed and silent aspiration and severe residue present post swallow. Pt is unable to transit residue with multiple swallows and postures and strategies are not effective.   Given that pt has had a decline over the past 6 months and more precipitous decline over the last month with pt and his wife suspecting aspiration of his current diet, likely dysphagia was severe even before pt developed AMS and UTI. Pt does not have any acute pulmonay finding despite severity of aspiration. Pt may chose to continue a diet despite high risk of aspiration, recommend Dys 2/nectar if risk is accepted. Strongly suggest neurology consult (discussed with MD). Palliative care would also be of assistance to pt and family in making decisions regarding goals of care.       CHL IP TREATMENT RECOMMENDATION 03/10/2015  Treatment Recommendations Therapy as outlined in treatment plan below     CHL IP DIET RECOMMENDATION 03/10/2015  SLP Diet Recommendations NPO  Liquid Administration via (None)  Medication Administration Crushed with puree  Compensations Slow rate;Small sips/bites;Multiple dry swallows after each bite/sip  Postural Changes and/or Swallow Maneuvers (None)     CHL IP OTHER RECOMMENDATIONS 03/10/2015  Recommended Consults Other (Comment)  Oral Care Recommendations Oral care BID  Other Recommendations Have oral suction available     CHL IP FOLLOW UP RECOMMENDATIONS 09/28/2014  Follow up Recommendations Home health SLP     CHL IP FREQUENCY AND DURATION 03/10/2015  Speech Therapy Frequency (ACUTE ONLY) (None)  Treatment Duration 2 weeks     Pertinent Vitals/Pain NA    SLP Swallow Goals No flowsheet data found.  No flowsheet data found.    CHL IP REASON FOR REFERRAL 03/10/2015  Reason for Referral (No Data)     CHL IP ORAL PHASE 03/10/2015  Lips (None)  Tongue (None)  Mucous membranes (None)   Nutritional status (None)  Other (None)  Oxygen therapy (None)  Oral Phase Impaired  Oral - Pudding Teaspoon (None)  Oral - Pudding Cup (None)  Oral - Honey Teaspoon (None)  Oral - Honey Cup (None)  Oral - Honey Syringe (None)  Oral - Nectar Teaspoon (None)  Oral - Nectar Cup (None)  Oral - Nectar Straw (None)  Oral - Nectar Syringe (None)  Oral - Ice Chips (None)  Oral - Thin Teaspoon (None)  Oral - Thin Cup (None)  Oral - Thin Straw (None)  Oral - Thin Syringe (None)  Oral - Puree (None)  Oral - Mechanical Soft (None)  Oral - Regular (None)  Oral - Multi-consistency (None)  Oral - Pill (None)  Oral Phase - Comment (None)      CHL IP PHARYNGEAL PHASE 03/10/2015  Pharyngeal Phase Impaired  Pharyngeal - Pudding Teaspoon (None)  Penetration/Aspiration details (pudding teaspoon) (None)  Pharyngeal - Pudding Cup (None)  Penetration/Aspiration details (pudding cup) (None)  Pharyngeal - Honey Teaspoon (None)  Penetration/Aspiration details (honey teaspoon) (None)  Pharyngeal - Honey Cup (None)  Penetration/Aspiration details (honey cup) (None)  Pharyngeal - Honey Syringe (None)  Penetration/Aspiration details (honey syringe) (None)  Pharyngeal - Nectar Teaspoon (None)  Penetration/Aspiration details (nectar teaspoon) (None)  Pharyngeal - Nectar Cup (None)  Penetration/Aspiration details (nectar cup) (None)  Pharyngeal - Nectar Straw (None)  Penetration/Aspiration details (nectar straw) (None)  Pharyngeal - Nectar Syringe (None)  Penetration/Aspiration details (nectar syringe) (None)  Pharyngeal - Ice Chips (None)  Penetration/Aspiration details (ice chips) (None)  Pharyngeal - Thin Teaspoon (None)  Penetration/Aspiration details (thin teaspoon) (None)  Pharyngeal - Thin Cup (None)  Penetration/Aspiration details (thin cup) (None)  Pharyngeal - Thin Straw (None)  Penetration/Aspiration details (thin straw) (None)  Pharyngeal - Thin Syringe (None)   Penetration/Aspiration details (thin syringe') (None)  Pharyngeal - Puree (None)  Penetration/Aspiration details (puree) (None)  Pharyngeal - Mechanical Soft (None)  Penetration/Aspiration details (mechanical soft) (None)  Pharyngeal - Regular (None)  Penetration/Aspiration details (regular) (None)  Pharyngeal - Multi-consistency (None)  Penetration/Aspiration details (multi-consistency) (None)  Pharyngeal - Pill (None)  Penetration/Aspiration details (pill) (None)  Pharyngeal Comment (None)      CHL IP CERVICAL ESOPHAGEAL PHASE 04/05/2014  Cervical Esophageal Phase WFL  Pudding Teaspoon (None)  Pudding Cup (None)  Honey Teaspoon (None)  Honey Cup (None)  Honey Straw (None)  Nectar Teaspoon (None)  Nectar Cup (None)  Nectar Straw (None)  Nectar Sippy Cup (None)  Thin Teaspoon (None)  Thin Cup (None)  Thin Straw (None)  Thin Sippy Cup (None)  Cervical Esophageal Comment (None)    CHL IP GO 04/05/2014  Functional Assessment Tool Used clinical judgement  Functional Limitations Swallowing  Swallow Current Status (Z6109) CK  Swallow Goal Status (U0454) CK  Swallow Discharge Status (U9811) CK  Motor Speech Current Status (B1478) (None)  Motor Speech Goal Status (G9562) (None)  Motor Speech Goal Status (Z3086) (None)  Spoken Language Comprehension Current Status (V7846) (None)  Spoken Language Comprehension Goal Status (N6295) (None)  Spoken Language Comprehension Discharge Status (M8413) (None)  Spoken Language Expression Current Status (  M3361) (None)  Spoken Language Expression Goal Status [phone]) (None)  Spoken Language Expression Discharge Status [phone]) (None)  Attention Current Status [phone]) (None)  Attention Goal Status (T1173) (None)  Attention Discharge Status [phone]) (None)  Memory Current Status (I1030) (None)  Memory Goal Status (D3143) (None)  Memory Discharge Status (O8875) (None)  Voice Current Status (Z9728) (None)  Voice Goal Status (A0601) (None)   Voice Discharge Status (V6153) (None)  Other Speech-Language Pathology Functional Limitation [phone]) (None)  Other Speech-Language Pathology Functional Limitation Goal Status (X6147) (None)  Other Speech-Language Pathology Functional Limitation Discharge Status [phone]) (None)          Harlon Ditty, MA CCC-SLP [phone]  Claudine Mouton 03/10/2015, 1:11 PM

## 2015-03-10 NOTE — Consult Note (Addendum)
NEURO HOSPITALIST CONSULT NOTE    Reason for Consult: Dysphagia  HPI:                                                                                                                                          Ronald Payne is an 81 y.o. male with known Afib on coumadin who has been in and out of hospital with encephalopathy due to UTI.  Wife states since December of 2015 she has notice he has been having a progressive decline in his swallowing.  At first he felt as though his food was getting stuck in the back of his throat. This progressed to coughing and gaging.  He now has been on a soft pureed diet for the past three months and gets all his pills crushed.  Family is not aware of any CVA's. They have noted his short term memory has decline, he uses a walker and has a stooped posture.  He will take short steps and does shuffle his feet.  He has been drooling more over the past 3 months. While talking with him I noted a resting tremor of both hands and tongue.  Wife states this has been going on for "quit some time".  She also notes he cannot get out of a chair without multiple attempts. Neuro was asked to evaluate patient for other causes of difficulty swallowing.   Past Medical History  Diagnosis Date  . Shortness of breath   . Hypothyroidism   . Hypertension   . Dysrhythmia     atrial fibrilation  . Arthritis   . Diabetes mellitus     type 2  . Insomnia, unspecified   . Acute bronchitis   . Other and unspecified hyperlipidemia   . Unspecified hearing loss   . Insomnia, unspecified   . Cataract     Past Surgical History  Procedure Laterality Date  . Back surgery    . Colon surgery      2001  . Colectomy  2001    Dr. Jamey Ripa    Family History  Problem Relation Age of Onset  . Heart disease Mother     Social History:  reports that he quit smoking about 21 years ago. His smoking use included Cigarettes and Cigars. He has a 27.5 pack-year smoking history.  He has never used smokeless tobacco. He reports that he does not drink alcohol or use illicit drugs.  Allergies  Allergen Reactions  . Amiodarone Other (See Comments)    Hyperthyroid on Amio in the past, changed to Multaq    MEDICATIONS:  Prior to Admission:  Prescriptions prior to admission  Medication Sig Dispense Refill Last Dose  . cetirizine (ZYRTEC) 10 MG tablet Take 10 mg by mouth daily. Take 1 tablet daily for allergies.   03/08/2015 at Unknown time  . COUMADIN 5 MG tablet TAKE 1 TABLET DAILY (Patient taking differently: No sig reported) 90 tablet 2 03/07/2015 at Unknown time  . dronedarone (MULTAQ) 400 MG tablet Take 1 tablet (400 mg total) by mouth 2 (two) times daily with a meal. 180 tablet 2 03/08/2015 at Unknown time  . HYDROcodone-acetaminophen (NORCO/VICODIN) 5-325 MG per tablet Take 1 tablet by mouth every 6 (six) hours as needed for moderate pain.   Past Week at Unknown time  . levothyroxine (SYNTHROID, LEVOTHROID) 50 MCG tablet Take 50 mcg by mouth daily before breakfast.   03/08/2015  . lovastatin (MEVACOR) 40 MG tablet Take 40 mg by mouth at bedtime. Take 40 mg daily to control cholesterol.   03/08/2015 at Unknown time  . metFORMIN (GLUCOPHAGE) 500 MG tablet Take 500 mg by mouth 2 (two) times daily with a meal.    03/08/2015 at Unknown time  . nitrofurantoin, macrocrystal-monohydrate, (MACROBID) 100 MG capsule Take 100 mg by mouth at bedtime.  12 03/07/2015 at Unknown time  . polyethylene glycol (MIRALAX / GLYCOLAX) packet Take 17 g by mouth daily.   03/08/2015 at Unknown time  . traZODone (DESYREL) 50 MG tablet Take 50 mg by mouth at bedtime.   03/07/2015 at Unknown time  . lansoprazole (PREVACID) 3 mg/ml SUSP oral suspension 2 tsp Take 30- 60 min before your first and last meals of the day (Patient not taking: Reported on 03/08/2015) 600 mL 11 Not Taking at Unknown time    Scheduled: . cefTRIAXone (ROCEPHIN)  IV  1 g Intravenous Q24H  . dronedarone  400 mg Oral BID WC  . lactose free nutrition  237 mL Oral TID WC  . loratadine  10 mg Oral Daily  . metoprolol tartrate  12.5 mg Oral BID  . polyethylene glycol  17 g Oral Daily  . pravastatin  40 mg Oral q1800  . sodium chloride  3 mL Intravenous Q12H  . sodium chloride  3 mL Intravenous Q12H  . Warfarin - Pharmacist Dosing Inpatient   Does not apply q1800   Continuous: . sodium chloride 75 mL/hr at 03/10/15 1305     ROS:                                                                                                                                       History obtained from the patient and wife  General ROS: negative for - chills, fatigue, fever, night sweats, weight gain or weight loss Psychological ROS: negative for - behavioral disorder, hallucinations, memory difficulties, mood swings or suicidal ideation Ophthalmic ROS: negative for - blurry vision, double vision, eye pain or loss of vision ENT ROS: negative for - epistaxis,  nasal discharge, oral lesions, sore throat, tinnitus or vertigo Allergy and Immunology ROS: negative for - hives or itchy/watery eyes Hematological and Lymphatic ROS: negative for - bleeding problems, bruising or swollen lymph nodes Endocrine ROS: negative for - galactorrhea, hair pattern changes, polydipsia/polyuria or temperature intolerance Respiratory ROS: negative for - cough, hemoptysis, shortness of breath or wheezing Cardiovascular ROS: negative for - chest pain, dyspnea on exertion, edema or irregular heartbeat Gastrointestinal ROS: negative for - abdominal pain, diarrhea, hematemesis, nausea/vomiting or stool incontinence Genito-Urinary ROS: negative for - dysuria, hematuria, incontinence or urinary frequency/urgency Musculoskeletal ROS: negative for - joint swelling or muscular weakness Neurological ROS: as noted in HPI Dermatological ROS: negative for rash and  skin lesion changes   Blood pressure 95/56, pulse 83, temperature 98.2 F (36.8 C), temperature source Oral, resp. rate 16, height 6' (1.829 m), weight 75.3 kg (166 lb 0.1 oz), SpO2 95 %.   Neurologic Examination:                                                                                                      HEENT-  Normocephalic, no lesions, without obvious abnormality.  Normal external eye and conjunctiva.  Normal TM's bilaterally.  Normal auditory canals and external ears. Normal external nose, mucus membranes and septum.  Normal pharynx. Cardiovascular- irregularly irregular rhythm, pulses palpable throughout   Lungs- chest clear, no wheezing, rales, normal symmetric air entry Abdomen- normal findings: bowel sounds normal Extremities- no edema Lymph-no adenopathy palpable Musculoskeletal-no joint tenderness, deformity or swelling Skin-warm and dry, no hyperpigmentation, vitiligo, or suspicious lesions  Neurological Examination Mental Status: Alert, oriented to hospital and month but thinks it is 2015.  Speech slurred without evidence of aphasia.  Able to follow 3 step commands without difficulty. Cranial Nerves: II: Discs flat bilaterally; Visual fields grossly normal, pupils equal, round, reactive to light and accommodation III,IV, VI: ptosis not present, extra-ocular motions intact bilaterally V,VII: smile symmetric, facial light touch sensation normal bilaterally--masked facial expression at baseline.  VIII: hearing normal bilaterally IX,X: uvula rises symmetrically XI: bilateral shoulder shrug XII: midline tongue extension--at rest has a tremor Motor: Right : Upper extremity   5/5    Left:     Upper extremity   5/5  Lower extremity   5/5     Lower extremity   5/5 --at rest has a right hand tremor that stops when in action --cogwheel rigidity bilateral wrist --no significant tone.  Tone and bulk:normal tone throughout; no atrophy noted Sensory: Pinprick and light touch  intact throughout, bilaterally Deep Tendon Reflexes: 1+ and symmetric throughout UE ,no KJ or AJ Plantars: Up going bilaterally Cerebellar: normal finger-to-nose,and normal heel-to-shin test Gait: short shuffled steps.       Lab Results: Basic Metabolic Panel:  Recent Labs Lab 03/08/15 2037 03/09/15 0555 03/10/15 0435  NA 139 139 138  K 3.7 3.7 4.4  CL 106 107 105  CO2 23 22 20*  GLUCOSE 121* 89 138*  BUN 18 16 22*  CREATININE 1.35* 1.07 1.39*  CALCIUM 9.7 9.5 9.7  MG  --   --  2.0    Liver Function Tests: No results for input(s): AST, ALT, ALKPHOS, BILITOT, PROT, ALBUMIN in the last 168 hours. No results for input(s): LIPASE, AMYLASE in the last 168 hours. No results for input(s): AMMONIA in the last 168 hours.  CBC:  Recent Labs Lab 03/08/15 2037 03/09/15 1010 03/10/15 0435  WBC 11.7* 13.1* 13.6*  NEUTROABS 9.6*  --   --   HGB 10.2* 10.7* 11.1*  HCT 31.2* 33.0* 34.0*  MCV 91.0 91.2 90.4  PLT 181 167 176    Cardiac Enzymes: No results for input(s): CKTOTAL, CKMB, CKMBINDEX, TROPONINI in the last 168 hours.  Lipid Panel: No results for input(s): CHOL, TRIG, HDL, CHOLHDL, VLDL, LDLCALC in the last 168 hours.  CBG: No results for input(s): GLUCAP in the last 168 hours.  Microbiology: Results for orders placed or performed during the hospital encounter of 03/08/15  Gram stain     Status: None   Collection Time: 03/08/15 10:24 PM  Result Value Ref Range Status   Specimen Description URINE, CATHETERIZED  Final   Special Requests NONE  Final   Gram Stain   Final    WBC PRESENT,BOTH PMN AND MONONUCLEAR GRAM POSITIVE COCCI IN PAIRS AND CHAINS CYTOSPUN    Report Status 03/08/2015 FINAL  Final  Urine culture     Status: None (Preliminary result)   Collection Time: 03/08/15 10:24 PM  Result Value Ref Range Status   Specimen Description URINE, CATHETERIZED  Final   Special Requests NONE  Final   Colony Count   Final    >=100,000 COLONIES/ML Performed  at [facility]    Culture   Final    ESCHERICHIA COLI Performed at [facility]    Report Status PENDING  Incomplete    Coagulation Studies:  Recent Labs  03/09/15 0042 03/10/15 1030  LABPROT 31.8* 41.0*  INR 3.05* 4.22*    Imaging: Dg Chest 2 View  03/08/2015   CLINICAL DATA:  Fever. Altered mental status. Hypertension. Diabetes.  EXAM: CHEST  2 VIEW  COMPARISON:  03/06/2015  FINDINGS: Normal heart size and pulmonary vascularity. No focal airspace disease or consolidation in the lungs. No blunting of costophrenic angles. No pneumothorax. Mediastinal contours appear intact. Calcified and tortuous aorta. Degenerative changes in the spine and shoulders.  IMPRESSION: No active cardiopulmonary disease.   Electronically Signed   By: Burman Nieves M.D.   On: 03/08/2015 22:04   Ct Head Wo Contrast  03/09/2015   CLINICAL DATA:  Altered mental status and fever since noon.  EXAM: CT HEAD WITHOUT CONTRAST  TECHNIQUE: Contiguous axial images were obtained from the base of the skull through the vertex without intravenous contrast.  COMPARISON:  09/27/2014  FINDINGS: Diffuse cerebral atrophy. Ventricular dilatation consistent with central atrophy. Low-attenuation changes throughout the deep white matter consistent with small vessel ischemia. No mass effect or midline shift. No abnormal extra-axial fluid collections. Gray-white matter junctions are distinct. Basal cisterns are not effaced. No evidence of acute intracranial hemorrhage. No depressed skull fractures. Visualized paranasal sinuses and mastoid air cells are not opacified. Vascular calcifications. Old nasal bone fractures.  IMPRESSION: No acute intracranial abnormalities. Chronic atrophy and small vessel ischemic changes.   Electronically Signed   By: Burman Nieves M.D.   On: 03/09/2015 00:50   Dg Swallowing Func-speech Pathology  03/10/2015   Yehuda Savannah Deblois, CCC-SLP     03/10/2015  1:14 PM  Objective Swallowing  Evaluation: Other (Comment)  Patient Details  Name: Ronald Payne  MRN: [id number] Date of Birth: 10/13/1923  Today's Date: 03/10/2015 Time: SLP Start Time (ACUTE ONLY): 1020-SLP Stop Time (ACUTE  ONLY): 1040 SLP Time Calculation (min) (ACUTE ONLY): 20 min  Past Medical History:  Past Medical History  Diagnosis Date  . Shortness of breath   . Hypothyroidism   . Hypertension   . Dysrhythmia     atrial fibrilation  . Arthritis   . Diabetes mellitus     type 2  . Insomnia, unspecified   . Acute bronchitis   . Other and unspecified hyperlipidemia   . Unspecified hearing loss   . Insomnia, unspecified   . Cataract    Past Surgical History:  Past Surgical History  Procedure Laterality Date  . Back surgery    . Colon surgery      2001  . Colectomy  2001    Dr. Jamey Ripa   HPI:  Other Pertinent Information: Ronald Payne is a 81 y.o. male  with a history of Paroxysmal Atrial Fibrillation on Coumadin Rx,  HTN, DM2, Hyperlipidemia, BPH who presents to the ED with  increased confusion and fever and chills. He has urinary  retention and self caths himself and has frequent UTI's. He  denies any history of a previous CVA but has chronic dysphagia  and has had 2 MBS ince admission on 01/14/14 for UTI. Last MBS  recommended Dys 3 diet/nectar thick liquids.  Pt is scheduled for  an outpatient MBS on 5/16.   No Data Recorded  Assessment / Plan / Recommendation CHL IP CLINICAL IMPRESSIONS 03/10/2015  Therapy Diagnosis Severe pharyngeal phase dysphagia;Severe oral  phase dysphagia  Clinical Impression Pt demonstrates a severe to profound  dysphagia, significantly worsened over the last year since last  MBS. Presentation very concerning for a progressive neuromuscular  disorder.   Oral function is severey impaired with weak lingual maniuplation  and bolus formation with anterior spillage and oral residue (also  observed with basic secretion management resulting in drooling).  Swallow resonse is delayed and grossly weak with poor bolus   propulsion and airway closure with significant sensed and silent  aspiration and severe residue present post swallow. Pt is unable  to transit residue with multiple swallows and postures and  strategies are not effective.   Given that pt has had a decline over the past 6 months and more  precipitous decline over the last month with pt and his wife  suspecting aspiration of his current diet, likely dysphagia was  severe even before pt developed AMS and UTI. Pt does not have any  acute pulmonay finding despite severity of aspiration. Pt may  chose to continue a diet despite high risk of aspiration,  recommend Dys 2/nectar if risk is accepted. Strongly suggest  neurology consult (discussed with MD). Palliative care would also  be of assistance to pt and family in making decisions regarding  goals of care.       CHL IP TREATMENT RECOMMENDATION 03/10/2015  Treatment Recommendations Therapy as outlined in treatment plan  below     CHL IP DIET RECOMMENDATION 03/10/2015  SLP Diet Recommendations NPO  Liquid Administration via (None)  Medication Administration Crushed with puree  Compensations Slow rate;Small sips/bites;Multiple dry swallows  after each bite/sip  Postural Changes and/or Swallow Maneuvers (None)     CHL IP OTHER RECOMMENDATIONS 03/10/2015  Recommended Consults Other (Comment)  Oral Care Recommendations Oral care BID  Other Recommendations Have oral suction available     CHL IP FOLLOW UP RECOMMENDATIONS 09/28/2014  Follow up Recommendations Home health SLP     CHL IP FREQUENCY AND DURATION 03/10/2015  Speech Therapy Frequency (ACUTE ONLY) (None)  Treatment Duration 2 weeks     Pertinent Vitals/Pain NA    SLP Swallow Goals No flowsheet data found.  No flowsheet data found.    CHL IP REASON FOR REFERRAL 03/10/2015  Reason for Referral (No Data)     CHL IP ORAL PHASE 03/10/2015  Lips (None)  Tongue (None)  Mucous membranes (None)  Nutritional status (None)  Other (None)  Oxygen therapy (None)  Oral Phase Impaired  Oral  - Pudding Teaspoon (None)  Oral - Pudding Cup (None)  Oral - Honey Teaspoon (None)  Oral - Honey Cup (None)  Oral - Honey Syringe (None)  Oral - Nectar Teaspoon (None)  Oral - Nectar Cup (None)  Oral - Nectar Straw (None)  Oral - Nectar Syringe (None)  Oral - Ice Chips (None)  Oral - Thin Teaspoon (None)  Oral - Thin Cup (None)  Oral - Thin Straw (None)  Oral - Thin Syringe (None)  Oral - Puree (None)  Oral - Mechanical Soft (None)  Oral - Regular (None)  Oral - Multi-consistency (None)  Oral - Pill (None)  Oral Phase - Comment (None)      CHL IP PHARYNGEAL PHASE 03/10/2015  Pharyngeal Phase Impaired  Pharyngeal - Pudding Teaspoon (None)  Penetration/Aspiration details (pudding teaspoon) (None)  Pharyngeal - Pudding Cup (None) Penetration/Aspiration details  (pudding cup) (None)  Pharyngeal - Honey Teaspoon (None)  Penetration/Aspiration details (honey teaspoon) (None)  Pharyngeal - Honey Cup (None)  Penetration/Aspiration details (honey cup) (None)  Pharyngeal - Honey Syringe (None)  Penetration/Aspiration details (honey syringe) (None)  Pharyngeal - Nectar Teaspoon (None)  Penetration/Aspiration details (nectar teaspoon) (None)  Pharyngeal - Nectar Cup (None)  Penetration/Aspiration details (nectar cup) (None)  Pharyngeal - Nectar Straw (None)  Penetration/Aspiration details (nectar straw) (None)  Pharyngeal - Nectar Syringe (None)  Penetration/Aspiration details (nectar syringe) (None)  Pharyngeal - Ice Chips (None)  Penetration/Aspiration details (ice chips) (None)  Pharyngeal - Thin Teaspoon (None)  Penetration/Aspiration details (thin teaspoon) (None)  Pharyngeal - Thin Cup (None)  Penetration/Aspiration details (thin cup) (None)  Pharyngeal - Thin Straw (None)  Penetration/Aspiration details (thin straw) (None)  Pharyngeal - Thin Syringe (None)  Penetration/Aspiration details (thin syringe') (None)  Pharyngeal - Puree (None)  Penetration/Aspiration details (puree) (None)  Pharyngeal - Mechanical Soft (None)   Penetration/Aspiration details (mechanical soft) (None)  Pharyngeal - Regular (None)  Penetration/Aspiration details (regular) (None)  Pharyngeal - Multi-consistency (None)  Penetration/Aspiration details (multi-consistency) (None)  Pharyngeal - Pill (None)  Penetration/Aspiration details (pill) (None)  Pharyngeal Comment (None)      CHL IP CERVICAL ESOPHAGEAL PHASE 04/05/2014  Cervical Esophageal Phase WFL  Pudding Teaspoon (None)  Pudding Cup (None)  Honey Teaspoon (None)  Honey Cup (None)  Honey Straw (None)  Nectar Teaspoon (None)  Nectar Cup (None)  Nectar Straw (None)  Nectar Sippy Cup (None)  Thin Teaspoon (None)  Thin Cup (None)  Thin Straw (None)  Thin Sippy Cup (None)  Cervical Esophageal Comment (None)    CHL IP GO 04/05/2014  Functional Assessment Tool Used clinical judgement  Functional Limitations Swallowing  Swallow Current Status (Z6109) CK  Swallow Goal Status (U0454) CK  Swallow Discharge Status (U9811) CK  Motor Speech Current Status (B1478) (None)  Motor Speech Goal Status (G9562) (None)  Motor Speech Goal Status (Z3086) (None) Spoken Language  Comprehension Current Status (V7846) (None)  Spoken Language Comprehension Goal  Status [phone]) (None)  Spoken Language Comprehension Discharge Status [phone]) (None)  Spoken Language Expression Current Status [phone]) (None)  Spoken Language Expression Goal Status [phone]) (None)  Spoken Language Expression Discharge Status [phone]) (None)  Attention Current Status (Q4696) (None)  Attention Goal Status (E9528) (None)  Attention Discharge Status [phone]) (None)  Memory Current Status (M0102) (None)  Memory Goal Status (V2536) (None)  Memory Discharge Status (U4403) (None)  Voice Current Status (K7425) (None)  Voice Goal Status (Z5638) (None)  Voice Discharge Status [phone]) (None)  Other Speech-Language Pathology Functional Limitation [phone])  (None)  Other Speech-Language Pathology Functional Limitation Goal Status  (O8416) (None)  Other Speech-Language Pathology  Functional Limitation Discharge  Status [phone]) (None)         Harlon Ditty, MA CCC-SLP [phone]  DeBlois, Riley Nearing 03/10/2015, 1:11 PM        Assessment and plan per attending neurologist  Felicie Morn PA-C [facility] [phone]  03/10/2015, 3:12 PM   Assessment/Plan:  81 yo M with prominent gait changes and R > L resting tremor. I suspect that this represents parkinson's disease and think that a sinemet trial is indicated. I suspect that his encephalopathy is multifactorial including possible UTI.   1) Sinemet 25-100 QID 2) Will continue to follow.   Ritta Slot, MD [facility] [phone]  If 7pm- 7am, please page neurology on call as listed in [facility].

## 2015-03-10 NOTE — Progress Notes (Signed)
Physical Therapy Treatment Patient Details Name: Ronald Payne MRN: [id number] DOB: February 20, 1923 Today's Date: 24-Mar-2015    History of Present Illness 81 y.o. male with h/o a fib, HTN, DM, urinary retention -self catheterizes, chronic dysphagia admitted with confusion, fever, chills. Dx of acute encephalopathy 2* UTI.     PT Comments    Ambulation distance limited by incontinence of liquid stool. Pt's gait pattern appears very like a Parkinson's type gait.  Follow Up Recommendations  Supervision/Assistance - 24 hour;Home health PT     Equipment Recommendations  None recommended by PT    Recommendations for Other Services       Precautions / Restrictions Precautions Precautions: Fall Precaution Comments: wife reports 1 fall in past year Restrictions Weight Bearing Restrictions: No    Mobility  Bed Mobility               General bed mobility comments: Up in chair.  Transfers Overall transfer level: Needs assistance Equipment used: 4-wheeled walker Transfers: Sit to/from Stand Sit to Stand: Mod assist         General transfer comment: Assist to bring hips up and shift weight anteriorly  Ambulation/Gait Ambulation/Gait assistance: Min assist Ambulation Distance (Feet): 10 Feet (x 2) Assistive device: 4-wheeled walker Gait Pattern/deviations: Step-to pattern;Decreased step length - right;Decreased step length - left;Shuffle     General Gait Details: Pt incontinent of liquid stool x 2 which limited amb distance. Verbal cues to stand more erect and incr step length.    Stairs            Wheelchair Mobility    Modified Rankin (Stroke Patients Only)       Balance Overall balance assessment: History of Falls;Needs assistance   Sitting balance-Leahy Scale: Good     Standing balance support: Bilateral upper extremity supported Standing balance-Leahy Scale: Poor Standing balance comment: rollator and min A for static standing. Posterior lean  and flexed posture.                    Cognition Arousal/Alertness: Awake/alert Behavior During Therapy: WFL for tasks assessed/performed Overall Cognitive Status: History of cognitive impairments - at baseline Area of Impairment: Memory     Memory: Decreased short-term memory              Exercises      General Comments        Pertinent Vitals/Pain      Home Living                      Prior Function            PT Goals (current goals can now be found in the care plan section) Acute Rehab PT Goals Patient Stated Goal: go back home Progress towards PT goals: Not progressing toward goals - comment (stool incontinence)    Frequency  Min 3X/week    PT Plan Current plan remains appropriate    Co-evaluation             End of Session Equipment Utilized During Treatment: Gait belt Activity Tolerance: Other (comment) (Limited by stool incontinence) Patient left: in chair;with call bell/phone within reach;with family/visitor present     Time: 1655-3748 PT Time Calculation (min) (ACUTE ONLY): 31 min  Charges:  $Gait Training: 38-52 mins                    G Codes:      Jem Castro Mar 24, 2015, 1:31 PM  [employer] PT [phone]

## 2015-03-10 NOTE — Progress Notes (Signed)
Thank you for consulting Palliative Medicine Team. We have received your consult and reviewed the chart. We are in the process of trying to reach the  family to set up a meeting this weekend to discuss goals of care and other issues that maybe important to Mr. Faga and his family.   Thank you, Eduard Roux, ANP

## 2015-03-10 NOTE — Progress Notes (Signed)
Medicare Important Message given?  YES (If response is "NO", the following Medicare IM given date fields will be blank) Date Medicare IM given:  03/10/15 Medicare IM given by:  Kenyan Karnes 

## 2015-03-10 NOTE — Progress Notes (Signed)
OT Cancellation Note  Patient Details Name: Ronald Payne MRN: [id number] DOB: 02-13-1923   Cancelled Treatment:    Reason Eval/Treat Not Completed: Other (comment) Spoke with PT and pt just finishing PT session and was incontinent of stool during session. Will try back another time.  Lennox Laity  [phone] 03/10/2015, 12:54 PM

## 2015-03-10 NOTE — Progress Notes (Signed)
ANTICOAGULATION CONSULT NOTE - Follow Up Consult  Pharmacy Consult for Coumadin Indication: atrial fibrillation  Allergies  Allergen Reactions  . Amiodarone Other (See Comments)    Hyperthyroid on Amio in the past, changed to Multaq    Patient Measurements: Height: 6' (182.9 cm) Weight: 166 lb 0.1 oz (75.3 kg) IBW/kg (Calculated) : 77.6 Heparin Dosing Weight:   Vital Signs: Temp: 98.2 F (36.8 C) (05/13 1049) Temp Source: Oral (05/13 1049) BP: 138/89 mmHg (05/13 1049) Pulse Rate: 79 (05/13 1049)  Labs:  Recent Labs  03/08/15 2037 03/09/15 0042 03/09/15 0555 03/09/15 1010 03/10/15 0435 03/10/15 1030  HGB 10.2*  --   --  10.7* 11.1*  --   HCT 31.2*  --   --  33.0* 34.0*  --   PLT 181  --   --  167 176  --   LABPROT  --  31.8*  --   --   --  41.0*  INR  --  3.05*  --   --   --  4.22*  CREATININE 1.35*  --  1.07  --  1.39*  --     Estimated Creatinine Clearance: 38.4 mL/min (by C-G formula based on Cr of 1.39).   Medications:  Scheduled:  . cefTRIAXone (ROCEPHIN)  IV  1 g Intravenous Q24H  . dronedarone  400 mg Oral BID WC  . lactose free nutrition  237 mL Oral TID WC  . loratadine  10 mg Oral Daily  . metoprolol tartrate  12.5 mg Oral BID  . polyethylene glycol  17 g Oral Daily  . pravastatin  40 mg Oral q1800  . sodium chloride  3 mL Intravenous Q12H  . sodium chloride  3 mL Intravenous Q12H  . Warfarin - Pharmacist Dosing Inpatient   Does not apply q1800    Assessment: 81yo male with AFib on chronic Coumadin.  Head CT on admission was (-) for acute abnormalities.  INR drawn late this AM, up to 4.22 today which is a significant jump from 3.05 yesterday.  No bleeding noted.  Dr Roda Shutters requested a f/u on his home medications to clarify whether he was taking Coreg pta or not.  I spoke with his wife and found he has been off this medication.  It was not on his Med Rec.  I did note that Levothyroxine was on his list and not on the Med Rec, but had to call [facility] to clarify the strength ( ).  Goal of Therapy:  INR 2-3 Monitor platelets by anticoagulation protocol: Yes   Plan:  No Coumadin today Added Levothyroxine daily to his MedRec and notified Dr Roda Shutters Continue to monitor CBC and for s/s of bleeding F/U Levothyroxine  Marisue Humble, PharmD Clinical Pharmacist [facility]

## 2015-03-10 NOTE — Progress Notes (Signed)
PROGRESS NOTE  Ronald Payne ZOX:[id number] DOB: [birth year] DOA: 03/08/2015 PCP: Aura Dials, MD  HPI/Recap of past 24 hours:  Intermittent confusion, mood labile. Crying after failed swallow eval, wife at bedside.  Assessment/Plan: Principal Problem:   Acute encephalopathy Active Problems:   Chronic anticoagulation   Diabetes mellitus   Dyslipidemia   BPH (benign prostatic hyperplasia), Dr Vernie Ammons follows   UTI (lower urinary tract infection)   Paroxysmal atrial fibrillation   Dysphagia   Urinary retention   Hypothyroidism   Protein-calorie malnutrition, severe Principal Problem:  1. Acute encephalopathy- Most Likely due to UTI, Improving Neuro checks suspect underline parkinson's, due to reported progressive weakness/tremor/difficulty swallowing, neurology consulted.  Active Problems:  2. UTI (lower urinary tract infection) Urine C+S sent IV Rocephin    3. Paroxysmal atrial fibrillation Telemetry Monitoring On coumadin Rx Check PT/INR daily Adjust Coumadin PRN Continue Multaq (Dronedarone) Rx    4. Chronic anticoagulation On coumadin Rx Monitor PT/INRs    5. Diabetes mellitus Hold Metformin Rx SSI coverage PRN Check HbA1C    6. Dyslipidemia Continue Statin Rx ( Mevacor)    7. BPH (benign prostatic hyperplasia), Dr Vernie Ammons follows Hx    8. Dysphagia failed swallow eval, family to make decision continue diet for comfort or consider feeding tube,  palliative care to see patient    9. Urinary retention- Due to BPH Continue to Straight Cath every 6 hrs   10. Hypothyroidism- not on medications currently TSH Level wnl   11. DVT Prophylaxis On Coumadin Rx.      Code Status: remain full code until family make decision.   Family Communication:   Patient and family  Disposition Plan: remain inpatient  Time spent: >97mins   Consultants:  Neurology  Palliative care  Procedures:  MBS  Antibiotics:  Rocephin from admission   Objective: BP 95/56 mmHg  Pulse 83  Temp(Src) 98.2 F (36.8 C) (Oral)  Resp 16  Ht 6' (1.829 m)  Wt 75.3 kg (166 lb 0.1 oz)  BMI 22.51 kg/m2  SpO2 95%  Intake/Output Summary (Last 24 hours) at 03/10/15 1459 Last data filed at 03/10/15 0948  Gross per 24 hour  Intake     50 ml  Output    500 ml  Net   -450 ml   Filed Weights   03/09/15 0206  Weight: 75.3 kg (166 lb 0.1 oz)    Exam:   General:  NAD, frail  Cardiovascular: RRR  Respiratory: CTABL  Abdomen: Soft/ND/NT, positive BS  Musculoskeletal: No Edema  Neuro: intermittent confusion, labile mood. No focal deficit.  Data Reviewed: Basic Metabolic Panel:  Recent Labs Lab 03/08/15 2037 03/09/15 0555 03/10/15 0435  NA 139 139 138  K 3.7 3.7 4.4  CL 106 107 105  CO2 23 22 20*  GLUCOSE 121* 89 138*  BUN 18 16 22*  CREATININE 1.35* 1.07 1.39*  CALCIUM 9.7 9.5 9.7  MG  --   --  2.0   Liver Function Tests: No results for input(s): AST, ALT, ALKPHOS, BILITOT, PROT, ALBUMIN in the last 168 hours. No results for input(s): LIPASE, AMYLASE in the last 168 hours. No results for input(s): AMMONIA in the last 168 hours. CBC:  Recent Labs Lab 03/08/15 2037 03/09/15 1010 03/10/15 0435  WBC 11.7* 13.1* 13.6*  NEUTROABS 9.6*  --   --   HGB 10.2* 10.7* 11.1*  HCT 31.2* 33.0* 34.0*  MCV 91.0 91.2 90.4  PLT 181 167 176   Cardiac  Enzymes:  No results for input(s): CKTOTAL, CKMB, CKMBINDEX, TROPONINI in the last 168 hours. BNP (last 3 results) No results for input(s): BNP in the last 8760 hours.  ProBNP (last 3 results) No results for input(s): PROBNP in the last 8760 hours.  CBG: No results for input(s): GLUCAP in the last 168 hours.  Recent Results (from the past 240 hour(s))  Gram stain     Status: None   Collection Time: 03/08/15 10:24 PM  Result Value Ref Range Status   Specimen Description URINE, CATHETERIZED  Final   Special Requests NONE  Final   Gram Stain   Final    WBC PRESENT,BOTH PMN AND MONONUCLEAR GRAM POSITIVE COCCI IN PAIRS AND CHAINS CYTOSPUN    Report Status 03/08/2015 FINAL  Final  Urine culture     Status: None (Preliminary result)   Collection Time: 03/08/15 10:24 PM  Result Value Ref Range Status   Specimen Description URINE, CATHETERIZED  Final   Special Requests NONE  Final   Colony Count   Final    >=100,000 COLONIES/ML Performed at [facility]    Culture   Final    ESCHERICHIA COLI Performed at [facility]    Report Status PENDING  Incomplete     Studies: Ct Head Wo Contrast  03/09/2015   CLINICAL DATA:  Altered mental status and fever since noon.  EXAM: CT HEAD WITHOUT CONTRAST  TECHNIQUE: Contiguous axial images were obtained from the base of the skull through the vertex without intravenous contrast.  COMPARISON:  09/27/2014  FINDINGS: Diffuse cerebral atrophy. Ventricular dilatation consistent with central atrophy. Low-attenuation changes throughout the deep white matter consistent with small vessel ischemia. No mass effect or midline shift. No abnormal extra-axial fluid collections. Gray-white matter junctions are distinct. Basal cisterns are not effaced. No evidence of acute intracranial hemorrhage. No depressed skull fractures. Visualized paranasal sinuses and mastoid air cells are not opacified. Vascular calcifications. Old nasal bone fractures.   IMPRESSION: No acute intracranial abnormalities. Chronic atrophy and small vessel ischemic changes.   Electronically Signed   By: Burman Nieves M.D.   On: 03/09/2015 00:50    Scheduled Meds: . cefTRIAXone (ROCEPHIN)  IV  1 g Intravenous Q24H  . dronedarone  400 mg Oral BID WC  . lactose free nutrition  237 mL Oral TID WC  . loratadine  10 mg Oral Daily  . metoprolol tartrate  12.5 mg Oral BID  . polyethylene glycol  17 g Oral Daily  . pravastatin  40 mg Oral q1800  . sodium chloride  3 mL Intravenous Q12H  . sodium chloride  3 mL Intravenous Q12H  . Warfarin - Pharmacist Dosing Inpatient   Does not apply q1800    Continuous Infusions: . sodium chloride 75 mL/hr at 03/10/15 1305     Time spent: >37mins  Keymoni Mccaster MD, PhD  [facility] Pager [phone]. If 7PM-7AM, please contact night-coverage at [email or web address], password [facility] 03/10/2015, 2:59 PM  LOS: 1 day

## 2015-03-11 ENCOUNTER — Inpatient Hospital Stay (HOSPITAL_COMMUNITY): Payer: Medicare Other

## 2015-03-11 DIAGNOSIS — Z515 Encounter for palliative care: Secondary | ICD-10-CM

## 2015-03-11 LAB — CBC
HCT: 33.1 % — ABNORMAL LOW (ref 39.0–52.0)
Hemoglobin: 10.5 g/dL — ABNORMAL LOW (ref 13.0–17.0)
MCH: 28.9 pg (ref 26.0–34.0)
MCHC: 31.7 g/dL (ref 30.0–36.0)
MCV: 91.2 fL (ref 78.0–100.0)
Platelets: 183 10*3/uL (ref 150–400)
RBC: 3.63 MIL/uL — ABNORMAL LOW (ref 4.22–5.81)
RDW: 14.5 % (ref 11.5–15.5)
WBC: 10.1 10*3/uL (ref 4.0–10.5)

## 2015-03-11 LAB — BASIC METABOLIC PANEL
Anion gap: 10 (ref 5–15)
BUN: 25 mg/dL — ABNORMAL HIGH (ref 6–20)
CALCIUM: 9.5 mg/dL (ref 8.9–10.3)
CO2: 23 mmol/L (ref 22–32)
Chloride: 107 mmol/L (ref 101–111)
Creatinine, Ser: 1.3 mg/dL — ABNORMAL HIGH (ref 0.61–1.24)
GFR calc Af Amer: 54 mL/min — ABNORMAL LOW (ref >60)
GFR, EST NON AFRICAN AMERICAN: 47 mL/min — AB (ref >60)
GLUCOSE: 129 mg/dL — AB (ref 65–99)
Potassium: 3 mmol/L — ABNORMAL LOW (ref 3.5–5.1)
SODIUM: 140 mmol/L (ref 135–145)

## 2015-03-11 LAB — HEMOGLOBIN A1C
HEMOGLOBIN A1C: 5.8 % — AB (ref 4.8–5.6)
MEAN PLASMA GLUCOSE: 120 mg/dL

## 2015-03-11 LAB — URINE CULTURE: Colony Count: >=100000

## 2015-03-11 LAB — PROTIME-INR
INR: 4.3 — ABNORMAL HIGH (ref 0.00–1.49)
Prothrombin Time: 41.6 seconds — ABNORMAL HIGH (ref 11.6–15.2)

## 2015-03-11 LAB — MAGNESIUM: MAGNESIUM: 1.9 mg/dL (ref 1.7–2.4)

## 2015-03-11 MED ORDER — MAGNESIUM SULFATE 2 GM/50ML IV SOLN
2.0000 g | Freq: Once | INTRAVENOUS | Status: AC
  Administered 2015-03-11: 2 g via INTRAVENOUS
  Filled 2015-03-11: qty 50

## 2015-03-11 MED ORDER — SERTRALINE HCL 20 MG/ML PO CONC: 25.0000 mg | Freq: Every day | ORAL | Status: DC

## 2015-03-11 MED ORDER — METOPROLOL TARTRATE 25 MG PO TABS
25.0000 mg | ORAL_TABLET | Freq: Two times a day (BID) | ORAL | Status: DC
  Administered 2015-03-11 – 2015-03-12 (×4): 25 mg via ORAL
  Filled 2015-03-11 (×6): qty 1

## 2015-03-11 MED ORDER — POTASSIUM CHLORIDE CRYS ER 20 MEQ PO TBCR
40.0000 meq | EXTENDED_RELEASE_TABLET | ORAL | Status: AC
  Administered 2015-03-11 (×2): 40 meq via ORAL
  Filled 2015-03-11 (×2): qty 2

## 2015-03-11 MED ORDER — METOPROLOL TARTRATE 1 MG/ML IV SOLN
2.5000 mg | Freq: Three times a day (TID) | INTRAVENOUS | Status: DC | PRN
  Administered 2015-03-11 – 2015-03-12 (×2): 2.5 mg via INTRAVENOUS
  Filled 2015-03-11 (×3): qty 5

## 2015-03-11 MED ORDER — LORAZEPAM 0.5 MG PO TABS: 0.2500 mg | ORAL_TABLET | ORAL | Status: DC | PRN

## 2015-03-11 MED ORDER — SODIUM CHLORIDE 0.9 % IV BOLUS (SEPSIS)
1000.0000 mL | INTRAVENOUS | Status: DC | PRN
  Administered 2015-03-11: 1000 mL via INTRAVENOUS
  Filled 2015-03-11: qty 1000

## 2015-03-11 MED ORDER — LORAZEPAM 2 MG/ML PO CONC: 0.2500 mg | ORAL | Status: DC | PRN

## 2015-03-11 MED ORDER — LEVOTHYROXINE SODIUM 50 MCG PO TABS
50.0000 ug | ORAL_TABLET | Freq: Every day | ORAL | Status: DC
  Administered 2015-03-11 – 2015-03-12 (×2): 50 ug via ORAL
  Filled 2015-03-11 (×3): qty 1

## 2015-03-11 MED ORDER — SERTRALINE HCL 25 MG PO TABS
25.0000 mg | ORAL_TABLET | Freq: Every day | ORAL | Status: DC
  Administered 2015-03-11 – 2015-03-14 (×4): 25 mg via ORAL
  Filled 2015-03-11 (×5): qty 1

## 2015-03-11 NOTE — Progress Notes (Signed)
PROGRESS NOTE  [name] BSJ:[id number] DOB: [birth year] DOA: 03/08/2015 PCP: Aura Dials, MD  HPI/Recap of past 24 hours:  Intermittent confusion, mood labile. Less tearful today, reported diarrhea, denies pain, intermittent dry cough noticed during encounter, wife states this is at baseline.  Assessment/Plan: Principal Problem:   Acute encephalopathy Active Problems:   Chronic anticoagulation   Diabetes mellitus   Dyslipidemia   BPH (benign prostatic hyperplasia), Dr Vernie Ammons follows   UTI (lower urinary tract infection)   Paroxysmal atrial fibrillation   Dysphagia   Urinary retention   Hypothyroidism   Protein-calorie malnutrition, severe   Palliative care encounter Principal Problem:  1. Acute encephalopathy- Most Likely due to UTI, Improving Neuro checks suspect underline parkinson's, due to reported progressive weakness/tremor/difficulty swallowing, neurology consulted.  Active Problems:  2. UTI (lower urinary tract infection) Ecoli UTI, pan sensitive IV Rocephin    3. Paroxysmal atrial fibrillation Telemetry Monitoring On coumadin Rx Check PT/INR daily Adjust Coumadin PRN Continue Multaq (Dronedarone) Rx, betablocker    4. Chronic anticoagulation On coumadin Rx Monitor PT/INRs    5. Diabetes mellitus Hold Metformin Rx SSI coverage PRN HbA1C 5.8    6. Dyslipidemia Continue Statin Rx ( Mevacor)    7. BPH (benign prostatic hyperplasia), Dr Vernie Ammons follows Hx    8.  Dysphagia failed swallow eval, family to make decision continue diet for comfort or consider feeding tube, palliative care to see patient,                         Still in the process of making decision, palliative care input appreciated, patient currently choose to continue oral intake knowing the risk of aspiration    9. Urinary retention- Due to BPH Continue to Straight Cath every 6 hrs   10. Hypothyroidism- not on medications currently TSH Level wnl   11. DVT Prophylaxis On Coumadin Rx.      Code Status: remain full code until family make decision.   Family Communication:   Patient and family  Disposition Plan: remain inpatient  Time spent: >25mins   Consultants:  Neurology  Palliative care  Procedures:  MBS  Antibiotics:  Rocephin from admission   Objective: BP 81/64 mmHg  Pulse 121  Temp(Src) 97.8 F (36.6 C) (Axillary)  Resp 20  Ht 6' (1.829 m)  Wt 75.8 kg (167 lb 1.7 oz)  BMI 22.66 kg/m2  SpO2 86%  Intake/Output Summary (Last 24 hours) at 03/11/15 1658 Last data filed at 03/11/15 0950  Gross per 24 hour  Intake    450 ml  Output    280 ml  Net    170 ml   Filed Weights   03/09/15 0206 03/11/15 0539  Weight: 75.3 kg (166 lb 0.1 oz) 75.8 kg (167 lb 1.7 oz)    Exam:   General:  NAD, frail  Cardiovascular: IRRR  Respiratory: CTABL  Abdomen: Soft/ND/NT, positive BS  Musculoskeletal: No Edema  Neuro: intermittent confusion, labile mood. No focal deficit.  Data Reviewed: Basic Metabolic Panel:  Recent Labs Lab 03/08/15 2037 03/09/15 0555 03/10/15 0435 03/11/15 0526 03/11/15 1400  NA 139 139 138 140  --   K 3.7 3.7 4.4 3.0*  --   CL 106 107 105 107  --   CO2 23 22 20* 23  --   GLUCOSE 121* 89 138* 129*  --   BUN 18 16 22* 25*  --   CREATININE 1.35* 1.07 1.39* 1.30*  --   CALCIUM  9.7 9.5 9.7 9.5  --   MG  --   --  2.0   --  1.9   Liver Function Tests: No results for input(s): AST, ALT, ALKPHOS, BILITOT, PROT, ALBUMIN in the last 168 hours. No results for input(s): LIPASE, AMYLASE in the last 168 hours. No results for input(s): AMMONIA in the last 168 hours. CBC:  Recent Labs Lab 03/08/15 2037 03/09/15 1010 03/10/15 0435 03/11/15 0526  WBC 11.7* 13.1* 13.6* 10.1  NEUTROABS 9.6*  --   --   --   HGB 10.2* 10.7* 11.1* 10.5*  HCT 31.2* 33.0* 34.0* 33.1*  MCV 91.0 91.2 90.4 91.2  PLT 181 167 176 183   Cardiac Enzymes:   No results for input(s): CKTOTAL, CKMB, CKMBINDEX, TROPONINI in the last 168 hours. BNP (last 3 results) No results for input(s): BNP in the last 8760 hours.  ProBNP (last 3 results) No results for input(s): PROBNP in the last 8760 hours.  CBG: No results for input(s): GLUCAP in the last 168 hours.  Recent Results (from the past 240 hour(s))  Gram stain     Status: None   Collection Time: 03/08/15 10:24 PM  Result Value Ref Range Status   Specimen Description URINE, CATHETERIZED  Final   Special Requests NONE  Final   Gram Stain   Final    WBC PRESENT,BOTH PMN AND MONONUCLEAR GRAM POSITIVE COCCI IN PAIRS AND CHAINS CYTOSPUN    Report Status 03/08/2015 FINAL  Final  Urine culture     Status: None   Collection Time: 03/08/15 10:24 PM  Result Value Ref Range Status   Specimen Description URINE, CATHETERIZED  Final   Special Requests NONE  Final   Colony Count   Final    >=100,000 COLONIES/ML Performed at [facility]    Culture   Final    ESCHERICHIA COLI Performed at [facility]    Report Status 03/11/2015 FINAL  Final   Organism ID, Bacteria ESCHERICHIA COLI  Final      Susceptibility   Escherichia coli - MIC*    AMPICILLIN <=2 SENSITIVE Sensitive     CEFAZOLIN <=4 SENSITIVE Sensitive     CEFTRIAXONE <=1 SENSITIVE Sensitive     CIPROFLOXACIN <=0.25 SENSITIVE Sensitive     GENTAMICIN <=1 SENSITIVE Sensitive     LEVOFLOXACIN <=0.12 SENSITIVE  Sensitive     NITROFURANTOIN <=16 SENSITIVE Sensitive     TOBRAMYCIN <=1 SENSITIVE Sensitive     TRIMETH/SULFA <=20 SENSITIVE Sensitive     PIP/TAZO <=4 SENSITIVE Sensitive     * ESCHERICHIA COLI     Studies: Dg Swallowing Func-speech Pathology  03/10/2015   Yehuda Savannah Deblois, CCC-SLP     03/10/2015  1:14 PM  Objective Swallowing Evaluation: Other (Comment)  Patient Details  Name: Ronald Payne MRN: [id number] Date of Birth: [birth year]  Today's Date: 03/10/2015 Time: SLP Start Time (ACUTE ONLY): 1020-SLP Stop Time (ACUTE  ONLY): 1040 SLP Time Calculation (min) (ACUTE ONLY): 20 min  Past Medical History:  Past Medical History  Diagnosis Date  . Shortness of breath   . Hypothyroidism   . Hypertension   . Dysrhythmia     atrial fibrilation  . Arthritis   . Diabetes mellitus     type 2  . Insomnia, unspecified   . Acute bronchitis   . Other and unspecified hyperlipidemia   . Unspecified hearing loss   . Insomnia, unspecified   . Cataract    Past Surgical History:  Past Surgical  History  Procedure Laterality Date  . Back surgery    . Colon surgery      2001  . Colectomy  2001    Dr. Jamey Ripa   HPI:  Other Pertinent Information: KEETON KASSEBAUM is a 81 y.o. male  with a history of Paroxysmal Atrial Fibrillation on Coumadin Rx,  HTN, DM2, Hyperlipidemia, BPH who presents to the ED with  increased confusion and fever and chills. He has urinary  retention and self caths himself and has frequent UTI's. He  denies any history of a previous CVA but has chronic dysphagia  and has had 2 MBS ince admission on 01/14/14 for UTI. Last MBS  recommended Dys 3 diet/nectar thick liquids.  Pt is scheduled for  an outpatient MBS on 5/16.   No Data Recorded  Assessment / Plan / Recommendation CHL IP CLINICAL IMPRESSIONS 03/10/2015  Therapy Diagnosis Severe pharyngeal phase dysphagia;Severe oral  phase dysphagia  Clinical Impression Pt demonstrates a severe to profound  dysphagia, significantly worsened over the last year since last   MBS. Presentation very concerning for a progressive neuromuscular  disorder.   Oral function is severey impaired with weak lingual maniuplation  and bolus formation with anterior spillage and oral residue (also  observed with basic secretion management resulting in drooling).  Swallow resonse is delayed and grossly weak with poor bolus  propulsion and airway closure with significant sensed and silent  aspiration and severe residue present post swallow. Pt is unable  to transit residue with multiple swallows and postures and  strategies are not effective.   Given that pt has had a decline over the past 6 months and more  precipitous decline over the last month with pt and his wife  suspecting aspiration of his current diet, likely dysphagia was  severe even before pt developed AMS and UTI. Pt does not have any  acute pulmonay finding despite severity of aspiration. Pt may  chose to continue a diet despite high risk of aspiration,  recommend Dys 2/nectar if risk is accepted. Strongly suggest  neurology consult (discussed with MD). Palliative care would also  be of assistance to pt and family in making decisions regarding  goals of care.       CHL IP TREATMENT RECOMMENDATION 03/10/2015  Treatment Recommendations Therapy as outlined in treatment plan  below     CHL IP DIET RECOMMENDATION 03/10/2015  SLP Diet Recommendations NPO  Liquid Administration via (None)  Medication Administration Crushed with puree  Compensations Slow rate;Small sips/bites;Multiple dry swallows  after each bite/sip  Postural Changes and/or Swallow Maneuvers (None)     CHL IP OTHER RECOMMENDATIONS 03/10/2015  Recommended Consults Other (Comment)  Oral Care Recommendations Oral care BID  Other Recommendations Have oral suction available     CHL IP FOLLOW UP RECOMMENDATIONS 09/28/2014  Follow up Recommendations Home health SLP     CHL IP FREQUENCY AND DURATION 03/10/2015  Speech Therapy Frequency (ACUTE ONLY) (None)  Treatment Duration 2 weeks      Pertinent Vitals/Pain NA    SLP Swallow Goals No flowsheet data found.  No flowsheet data found.    CHL IP REASON FOR REFERRAL 03/10/2015  Reason for Referral (No Data)     CHL IP ORAL PHASE 03/10/2015  Lips (None)  Tongue (None)  Mucous membranes (None)  Nutritional status (None)  Other (None)  Oxygen therapy (None)  Oral Phase Impaired  Oral - Pudding Teaspoon (None)  Oral - Pudding Cup (None)  Oral - Honey Teaspoon (None)  Oral - Honey Cup (None)  Oral - Honey Syringe (None)  Oral - Nectar Teaspoon (None)  Oral - Nectar Cup (None)  Oral - Nectar Straw (None)  Oral - Nectar Syringe (None)  Oral - Ice Chips (None)  Oral - Thin Teaspoon (None)  Oral - Thin Cup (None)  Oral - Thin Straw (None)  Oral - Thin Syringe (None)  Oral - Puree (None)  Oral - Mechanical Soft (None)  Oral - Regular (None)  Oral - Multi-consistency (None)  Oral - Pill (None)  Oral Phase - Comment (None)      CHL IP PHARYNGEAL PHASE 03/10/2015  Pharyngeal Phase Impaired  Pharyngeal - Pudding Teaspoon (None)  Penetration/Aspiration details (pudding teaspoon) (None)  Pharyngeal - Pudding Cup (None) Penetration/Aspiration details  (pudding cup) (None)  Pharyngeal - Honey Teaspoon (None)  Penetration/Aspiration details (honey teaspoon) (None)  Pharyngeal - Honey Cup (None)  Penetration/Aspiration details (honey cup) (None)  Pharyngeal - Honey Syringe (None)  Penetration/Aspiration details (honey syringe) (None)  Pharyngeal - Nectar Teaspoon (None)  Penetration/Aspiration details (nectar teaspoon) (None)  Pharyngeal - Nectar Cup (None)  Penetration/Aspiration details (nectar cup) (None)  Pharyngeal - Nectar Straw (None)  Penetration/Aspiration details (nectar straw) (None)  Pharyngeal - Nectar Syringe (None)  Penetration/Aspiration details (nectar syringe) (None)  Pharyngeal - Ice Chips (None)  Penetration/Aspiration details (ice chips) (None)  Pharyngeal - Thin Teaspoon (None)  Penetration/Aspiration details (thin teaspoon) (None)  Pharyngeal - Thin Cup  (None)  Penetration/Aspiration details (thin cup) (None)  Pharyngeal - Thin Straw (None)  Penetration/Aspiration details (thin straw) (None)  Pharyngeal - Thin Syringe (None)  Penetration/Aspiration details (thin syringe') (None)  Pharyngeal - Puree (None)  Penetration/Aspiration details (puree) (None)  Pharyngeal - Mechanical Soft (None)  Penetration/Aspiration details (mechanical soft) (None)  Pharyngeal - Regular (None)  Penetration/Aspiration details (regular) (None)  Pharyngeal - Multi-consistency (None)  Penetration/Aspiration details (multi-consistency) (None)  Pharyngeal - Pill (None)  Penetration/Aspiration details (pill) (None)  Pharyngeal Comment (None)      CHL IP CERVICAL ESOPHAGEAL PHASE 04/05/2014  Cervical Esophageal Phase WFL  Pudding Teaspoon (None)  Pudding Cup (None)  Honey Teaspoon (None)  Honey Cup (None)  Honey Straw (None)  Nectar Teaspoon (None)  Nectar Cup (None)  Nectar Straw (None)  Nectar Sippy Cup (None)  Thin Teaspoon (None)  Thin Cup (None)  Thin Straw (None)  Thin Sippy Cup (None)  Cervical Esophageal Comment (None)    CHL IP GO 04/05/2014  Functional Assessment Tool Used clinical judgement  Functional Limitations Swallowing  Swallow Current Status (Z6109) CK  Swallow Goal Status (U0454) CK  Swallow Discharge Status (U9811) CK  Motor Speech Current Status (B1478) (None)  Motor Speech Goal Status (G9562) (None)  Motor Speech Goal Status (Z3086) (None) Spoken Language  Comprehension Current Status (V7846) (None)  Spoken Language Comprehension Goal Status (N6295) (None)  Spoken Language Comprehension Discharge Status (M8413) (None)  Spoken Language Expression Current Status (K4401) (None)  Spoken Language Expression Goal Status (U2725) (None)  Spoken Language Expression Discharge Status [phone]) (None)  Attention Current Status (I3474) (None)  Attention Goal Status (Q5956) (None)  Attention Discharge Status (L8756) (None)  Memory Current Status (E3329) (None)  Memory Goal Status (J1884)  (None)  Memory Discharge Status (Z6606) (None)  Voice Current Status (T0160) (None)  Voice Goal Status (F0932) (None)  Voice Discharge Status (T5573) (None)  Other Speech-Language Pathology Functional Limitation (U2025)  (None)  Other Speech-Language Pathology Functional Limitation Goal Status  (K2706) (None)  Other Speech-Language Pathology Functional Limitation Discharge  Status (  Z6109) (None)         Harlon Ditty, MA CCC-SLP [phone]  DeBlois, Riley Nearing 03/10/2015, 1:11 PM     Scheduled Meds: . carbidopa-levodopa  1 tablet Oral QID  . cefTRIAXone (ROCEPHIN)  IV  1 g Intravenous Q24H  . dronedarone  400 mg Oral BID WC  . lactose free nutrition  237 mL Oral TID WC  . levothyroxine  50 mcg Oral QAC breakfast  . loratadine  10 mg Oral Daily  . metoprolol tartrate  25 mg Oral BID  . polyethylene glycol  17 g Oral Daily  . pravastatin  40 mg Oral q1800  . sertraline  25 mg Oral Daily  . sodium chloride  3 mL Intravenous Q12H  . sodium chloride  3 mL Intravenous Q12H  . Warfarin - Pharmacist Dosing Inpatient   Does not apply q1800    Continuous Infusions:     Time spent: >23mins  Sua Spadafora MD, PhD  [facility] Pager [phone]. If 7PM-7AM, please contact night-coverage at [email or web address], password [facility] 03/11/2015, 4:58 PM  LOS: 2 days

## 2015-03-11 NOTE — Progress Notes (Signed)
Subjective: No change noted yet, but only had one dose.   Exam: Filed Vitals:   03/11/15 0539  BP: 144/104  Pulse: 75  Temp: 97.6 F (36.4 C)  Resp: 20   Gen: In bed, NAD MS: Awake, alert WO:[id number], abnormal tongue movements which abate with intentional tongue movement(not fasciculations).  Motor: increased tone R > L  Pertinent Labs: Elevated INR  Impression: 81 yo M who I suspect has parkinsons disease. He will need a sinemet trial. Though this can be started as an inpatient, I typically think that this is best done as an outpatient. His encephaloapthy seems to be improving, though having family to corroborate this would be helpful.   Recommendations: 1) sinemet 25-100 QID  Ritta Slot, MD [facility] [phone]  If 7pm- 7am, please page neurology on call as listed in AMION.

## 2015-03-11 NOTE — Progress Notes (Signed)
ANTICOAGULATION CONSULT NOTE - Follow Up Consult  Pharmacy Consult for Coumadin Indication: atrial fibrillation  Allergies  Allergen Reactions  . Amiodarone Other (See Comments)    Hyperthyroid on Amio in the past, changed to Multaq    Patient Measurements: Height: 6' (182.9 cm) Weight: 167 lb 1.7 oz (75.8 kg) IBW/kg (Calculated) : 77.6 Heparin Dosing Weight:   Vital Signs: Temp: 97.8 F (36.6 C) (05/14 1342) Temp Source: Axillary (05/14 1342) BP: 81/64 mmHg (05/14 1342) Pulse Rate: 121 (05/14 1342)  Labs:  Recent Labs  03/09/15 0042 03/09/15 0555 03/09/15 1010 03/10/15 0435 03/10/15 1030 03/11/15 0526  HGB  --   --  10.7* 11.1*  --  10.5*  HCT  --   --  33.0* 34.0*  --  33.1*  PLT  --   --  167 176  --  183  LABPROT 31.8*  --   --   --  41.0* 41.6*  INR 3.05*  --   --   --  4.22* 4.30*  CREATININE  --  1.07  --  1.39*  --  1.30*    Estimated Creatinine Clearance: 41.3 mL/min (by C-G formula based on Cr of 1.3).   Medications:  Scheduled:  . carbidopa-levodopa  1 tablet Oral QID  . cefTRIAXone (ROCEPHIN)  IV  1 g Intravenous Q24H  . dronedarone  400 mg Oral BID WC  . lactose free nutrition  237 mL Oral TID WC  . levothyroxine  50 mcg Oral QAC breakfast  . loratadine  10 mg Oral Daily  . metoprolol tartrate  25 mg Oral BID  . polyethylene glycol  17 g Oral Daily  . pravastatin  40 mg Oral q1800  . sertraline  25 mg Oral Daily  . sodium chloride  3 mL Intravenous Q12H  . sodium chloride  3 mL Intravenous Q12H  . Warfarin - Pharmacist Dosing Inpatient   Does not apply q1800    Assessment: 81yo male with PAF.  INR is currently above goal and increased again today, but appears to be cresting.  I expect it to decrease tomorrow.  No bleeding noted and Hg stable.  Goal of Therapy:  INR 2-3 Monitor platelets by anticoagulation protocol: Yes   Plan:  No Coumadin today F/U in AM  Marisue Humble, PharmD Clinical Pharmacist  System- [facility]

## 2015-03-11 NOTE — Progress Notes (Signed)
Patient ID: Ronald Payne, male   DOB: 1923/03/27, 81 y.o.   MRN: [id number]  Consultation Note Date: 03/11/2015   Patient Name: Ronald Payne  DOB: December 15, 1922  MRN: [id number]  Age / Sex: 81 y.o., male   PCP: Ronald Limbo, MD Referring Physician: Florencia Reasons, MD  Reason for Consultation: Establishing goals of care  Palliative Care Assessment and Plan Summary of Established Goals of Care and Medical Treatment Preferences    Palliative Care Discussion Held Today Contacts/Participants in Discussion: Primary Decision Maker: Wife Ronald Payne  HCPOA: yes  Met with pt, wife and daughter to begin discussion regarding goals of care in light of severe dysphagia and likely new dx of Parkinson disease. Pt was seen by neurology and started on sinemet 03/10/15. Pt has been declining over the past year. He has had recurrent UTI's secondary to urinary retention and has been self-cathing at home, trouble swallowing and has become markedly weaker. He has been falling at home. He also has been having crying spells and severe anxiety. Spent time this morning discusssing disease progression secondary to Parkinson Disease specifically high risk for aspiration pneumonia, management of respiratory decline, secretions as well as weakness, and urinary retention. Wife shares in looking back she feels that this probable dx of Parkinson Disease makes sense with what they have been experiencing over the past year. Pt is very tearful through out our conversation. Speaks a lot about the past and has difficulty participating in concrete decision making. Wife shares they have never discussed as a couple advanced care planning or  what if scenarios. Wife and daughter given booklet " Hard Choices: to facilitate further discussion about code status and PEG tube going forward.  Code Status/Advance Care Planning:  Full Code  Symptom Management:   Depression: Pt is very tearful, feels worried. Will start Zoloft 25 mg daily  with goal to titrate to 50 mg if tolerated in a week and then further titration for effect. Therapeutic dose is usually 100 mg daily.  Anxiety: Pt unable to sleep. Feels worried and anxious. Will leave orders for low dose ativan po .44m q4 prn. Zoloft may help this when a therapeutic dose is reached    Psycho-social/Spiritual:   Support System: Wife and daughter very supportive  Desire for further Chaplaincy support:not at this time  Prognosis: < 6 months. Pt at high risk for aspiration pneumonia, sepsis. Prognosis is variable though since he has just started on sinemet and degree of benefit not yet known.   Discharge Planning:  Likely will need SNF for rehab given his level of weakness. If more of a comfort approach is desired he would probably meet criteria for hospice in-home services         Chief Complaint/History of Present Illness: Pt is a 81yo man adm with worsening confusion, fever and chills. He has a h/o recurrent UTI's and severe dysphagia.  Primary Diagnoses  Present on Admission:  . Acute encephalopathy . UTI (lower urinary tract infection) . Paroxysmal atrial fibrillation . Dyslipidemia . Urinary retention . BPH (benign prostatic hyperplasia), Dr OKarsten Rofollows . Hypothyroidism  Palliative Review of Systems: Pt is confused, tearful.  I have reviewed the medical record, interviewed the patient and family, and examined the patient. The following aspects are pertinent.  Past Medical History  Diagnosis Date  . Shortness of breath   . Hypothyroidism   . Hypertension   . Dysrhythmia     atrial fibrilation  . Arthritis   . Diabetes mellitus  type 2  . Insomnia, unspecified   . Acute bronchitis   . Other and unspecified hyperlipidemia   . Unspecified hearing loss   . Insomnia, unspecified   . Cataract    History   Social History  . Marital Status: Married    Spouse Name: N/A  . Number of Children: N/A  . Years of Education: N/A   Social  History Main Topics  . Smoking status: Former Smoker -- 0.50 packs/day for 55 years    Types: Cigarettes, Cigars    Quit date: 01/14/1994  . Smokeless tobacco: Never Used  . Alcohol Use: No  . Drug Use: No  . Sexual Activity: Not Currently   Other Topics Concern  . None   Social History Narrative   Family History  Problem Relation Age of Onset  . Heart disease Mother    Scheduled Meds: . carbidopa-levodopa  1 tablet Oral QID  . cefTRIAXone (ROCEPHIN)  IV  1 g Intravenous Q24H  . dronedarone  400 mg Oral BID WC  . lactose free nutrition  237 mL Oral TID WC  . levothyroxine  50 mcg Oral QAC breakfast  . loratadine  10 mg Oral Daily  . metoprolol tartrate  25 mg Oral BID  . polyethylene glycol  17 g Oral Daily  . potassium chloride  40 mEq Oral Q4H  . pravastatin  40 mg Oral q1800  . sertraline  25 mg Oral Daily  . sodium chloride  3 mL Intravenous Q12H  . sodium chloride  3 mL Intravenous Q12H  . Warfarin - Pharmacist Dosing Inpatient   Does not apply q1800   Continuous Infusions:  PRN Meds:.sodium chloride, acetaminophen **OR** acetaminophen, food thickener, LORazepam, metoprolol, sodium chloride Medications Prior to Admission:  Prior to Admission medications   Medication Sig Start Date End Date Taking? Authorizing Provider  cetirizine (ZYRTEC) 10 MG tablet Take 10 mg by mouth daily. Take 1 tablet daily for allergies.   Yes Historical Provider, MD  COUMADIN 5 MG tablet TAKE 1 TABLET DAILY Patient taking differently: No sig reported 11/16/14  Yes Mihai Croitoru, MD  dronedarone (MULTAQ) 400 MG tablet Take 1 tablet (400 mg total) by mouth 2 (two) times daily with a meal. 11/02/14  Yes Mihai Croitoru, MD  HYDROcodone-acetaminophen (NORCO/VICODIN) 5-325 MG per tablet Take 1 tablet by mouth every 6 (six) hours as needed for moderate pain.   Yes Historical Provider, MD  levothyroxine (SYNTHROID, LEVOTHROID) 50 MCG tablet Take 50 mcg by mouth daily before breakfast.   Yes Historical  Provider, MD  lovastatin (MEVACOR) 40 MG tablet Take 40 mg by mouth at bedtime. Take 40 mg daily to control cholesterol.   Yes Historical Provider, MD  metFORMIN (GLUCOPHAGE) 500 MG tablet Take 500 mg by mouth 2 (two) times daily with a meal.    Yes Historical Provider, MD  nitrofurantoin, macrocrystal-monohydrate, (MACROBID) 100 MG capsule Take 100 mg by mouth at bedtime. 11/09/14  Yes Historical Provider, MD  polyethylene glycol (MIRALAX / GLYCOLAX) packet Take 17 g by mouth daily.   Yes Historical Provider, MD  traZODone (DESYREL) 50 MG tablet Take 50 mg by mouth at bedtime.   Yes Historical Provider, MD  lansoprazole (PREVACID) 3 mg/ml SUSP oral suspension 2 tsp Take 30- 60 min before your first and last meals of the day Patient not taking: Reported on 03/08/2015 03/06/15   Tanda Rockers, MD   Allergies  Allergen Reactions  . Amiodarone Other (See Comments)    Hyperthyroid on Amio  in the past, changed to Multaq   CBC:    Component Value Date/Time   WBC 10.1 03/11/2015 0526   HGB 10.5* 03/11/2015 0526   HCT 33.1* 03/11/2015 0526   PLT 183 03/11/2015 0526   MCV 91.2 03/11/2015 0526   NEUTROABS 9.6* 03/08/2015 2037   LYMPHSABS 1.0 03/08/2015 2037   MONOABS 1.1* 03/08/2015 2037   EOSABS 0.1 03/08/2015 2037   BASOSABS 0.0 03/08/2015 2037   Comprehensive Metabolic Panel:    Component Value Date/Time   NA 140 03/11/2015 0526   K 3.0* 03/11/2015 0526   CL 107 03/11/2015 0526   CO2 23 03/11/2015 0526   BUN 25* 03/11/2015 0526   CREATININE 1.30* 03/11/2015 0526   GLUCOSE 129* 03/11/2015 0526   CALCIUM 9.5 03/11/2015 0526   AST 12 09/28/2014 0557   ALT 7 09/28/2014 0557   ALKPHOS 58 09/28/2014 0557   BILITOT 0.2* 09/28/2014 0557   PROT 6.0 09/28/2014 0557   ALBUMIN 3.0* 09/28/2014 0557    Physical Exam: Vital Signs: BP 144/104 mmHg  Pulse 136  Temp(Src) 97.6 F (36.4 C) (Oral)  Resp 20  Ht 6' (1.829 m)  Wt 75.8 kg (167 lb 1.7 oz)  BMI 22.66 kg/m2  SpO2 100% SpO2: SpO2:  100 % O2 Device: O2 Device: Nasal Cannula O2 Flow Rate: O2 Flow Rate (L/min): 0 L/min Intake/output summary:  Intake/Output Summary (Last 24 hours) at 03/11/15 1111 Last data filed at 03/11/15 0950  Gross per 24 hour  Intake    450 ml  Output    280 ml  Net    170 ml   LBM:   Baseline Weight: Weight: 75.3 kg (166 lb 0.1 oz) Most recent weight: Weight: 75.8 kg (167 lb 1.7 oz)           Palliative Performance Scale: 40%              Additional Data Reviewed: Recent Labs     03/10/15  0435  03/11/15  0526  WBC  13.6*  10.1  HGB  11.1*  10.5*  PLT  176  183  NA  138  140  BUN  22*  25*  CREATININE  1.39*  1.30*     Time In: 0900 Time Out: 1015 Time Total: 75  Greater than 50%  of this time was spent counseling and coordinating care related to the above assessment and plan. Discussed With Dr. Erlinda Hong. Will follow up 03/12/15  Signed by: Dory Horn, NP  Dory Horn, NP  03/11/2015, 11:11 AM  Please contact Palliative Medicine Team phone at [phone] for questions and concerns.

## 2015-03-11 NOTE — Progress Notes (Signed)
Speech Language Pathology Treatment: Dysphagia  Patient Details Name: Ronald Payne MRN: [id number] DOB: [birth year] Today's Date: 03/11/2015 Time: 4854-6270 SLP Time Calculation (min) (ACUTE ONLY): 15 min  Assessment / Plan / Recommendation Clinical Impression  Pt observed with nectar thick liquids - note upon chart review that pt is now on regular diet and nectar thick liquids. Per MD, pt has met with palliative care (note not yet in chart) and does not want a feeding tube. Pt and family reiterate these wishes to SLP this morning. SLP reviewed the risks of aspiration with continued PO intake, with review of recommended textures and swallowing precautions should pt opt for comfort feeds to increase quality of life. Pt observed to have anterior loss, wet vocal quality, and delayed coughing with oral expectoration with nectar thick liquids.   Will adjust diet order to reflect recommended, least restrictive diet - educated pt/family about overall recommendation to remain NPO until their decision has been confirmed for comfort feeds given known aspiration. They are in agreement with plan.   HPI Other Pertinent Information: Ronald Payne is a 81 y.o. male with a history of Paroxysmal Atrial Fibrillation on Coumadin Rx, HTN, DM2, Hyperlipidemia, BPH who presents to the ED with increased confusion and fever and chills. He has urinary retention and self caths himself and has frequent UTI's. He denies any history of a previous CVA but has chronic dysphagia and has had 2 MBS ince admission on 01/14/14 for UTI. Last MBS recommended Dys 3 diet/nectar thick liquids.  Pt is scheduled for an outpatient MBS on 5/16.    Pertinent Vitals Pain Assessment: No/denies pain  SLP Plan  Goals updated    Recommendations Diet recommendations: Dysphagia 2 (fine chop);Nectar-thick liquid;Other(comment) (ONLY if choosing to accept aspiration risk - otherwise NPO) Liquids provided via: Cup;No straw;Teaspoon Medication  Administration: Crushed with puree Supervision: Patient able to self feed;Full supervision/cueing for compensatory strategies Compensations: Slow rate;Small sips/bites;Multiple dry swallows after each bite/sip Postural Changes and/or Swallow Maneuvers: Seated upright 90 degrees;Upright 30-60 min after meal       Oral Care Recommendations: Oral care BID Follow up Recommendations:  (tba) Plan: Goals updated    Ronald Payne, M.A. CCC-SLP [phone]  Ronald Payne 03/11/2015, 10:41 AM

## 2015-03-12 LAB — CBC
HCT: 28.9 % — ABNORMAL LOW (ref 39.0–52.0)
Hemoglobin: 9.4 g/dL — ABNORMAL LOW (ref 13.0–17.0)
MCH: 29.7 pg (ref 26.0–34.0)
MCHC: 32.5 g/dL (ref 30.0–36.0)
MCV: 91.2 fL (ref 78.0–100.0)
Platelets: 173 10*3/uL (ref 150–400)
RBC: 3.17 MIL/uL — ABNORMAL LOW (ref 4.22–5.81)
RDW: 14.7 % (ref 11.5–15.5)
WBC: 5.5 10*3/uL (ref 4.0–10.5)

## 2015-03-12 LAB — BASIC METABOLIC PANEL
Anion gap: 9 (ref 5–15)
BUN: 25 mg/dL — AB (ref 6–20)
CALCIUM: 9.4 mg/dL (ref 8.9–10.3)
CO2: 20 mmol/L — ABNORMAL LOW (ref 22–32)
Chloride: 115 mmol/L — ABNORMAL HIGH (ref 101–111)
Creatinine, Ser: 1.07 mg/dL (ref 0.61–1.24)
GFR calc Af Amer: >60 mL/min (ref >60)
GFR calc non Af Amer: 59 mL/min — ABNORMAL LOW (ref >60)
Glucose, Bld: 117 mg/dL — ABNORMAL HIGH (ref 65–99)
Potassium: 3.6 mmol/L (ref 3.5–5.1)
Sodium: 144 mmol/L (ref 135–145)

## 2015-03-12 LAB — MAGNESIUM: MAGNESIUM: 2.2 mg/dL (ref 1.7–2.4)

## 2015-03-12 LAB — PROTIME-INR
INR: 3.94 — ABNORMAL HIGH (ref 0.00–1.49)
Prothrombin Time: 38.8 seconds — ABNORMAL HIGH (ref 11.6–15.2)

## 2015-03-12 LAB — CLOSTRIDIUM DIFFICILE BY PCR: CDIFFPCR: NEGATIVE

## 2015-03-12 MED ORDER — METOPROLOL TARTRATE 1 MG/ML IV SOLN
5.0000 mg | Freq: Four times a day (QID) | INTRAVENOUS | Status: DC
  Administered 2015-03-12 – 2015-03-13 (×3): 5 mg via INTRAVENOUS
  Filled 2015-03-12 (×7): qty 5

## 2015-03-12 MED ORDER — METOPROLOL TARTRATE 1 MG/ML IV SOLN
5.0000 mg | Freq: Once | INTRAVENOUS | Status: AC
  Administered 2015-03-12: 5 mg via INTRAVENOUS
  Filled 2015-03-12: qty 5

## 2015-03-12 MED ORDER — SODIUM CHLORIDE 0.9 % IV BOLUS (SEPSIS)
500.0000 mL | Freq: Once | INTRAVENOUS | Status: AC
  Administered 2015-03-12: 500 mL via INTRAVENOUS

## 2015-03-12 MED ORDER — POTASSIUM CHLORIDE CRYS ER 20 MEQ PO TBCR
40.0000 meq | EXTENDED_RELEASE_TABLET | Freq: Once | ORAL | Status: AC
  Administered 2015-03-12: 40 meq via ORAL
  Filled 2015-03-12: qty 2

## 2015-03-12 MED ORDER — POTASSIUM CHLORIDE IN NACL 20-0.9 MEQ/L-% IV SOLN
INTRAVENOUS | Status: DC
Start: 2015-03-12 — End: 2015-03-13
  Administered 2015-03-12 – 2015-03-13 (×2): via INTRAVENOUS
  Filled 2015-03-12 (×4): qty 1000

## 2015-03-12 NOTE — Progress Notes (Signed)
HR was going between 120-150 this AM.  Gave PRN dose of metoprolol IV. HR rate getting slowly better.

## 2015-03-12 NOTE — Progress Notes (Signed)
ANTICOAGULATION CONSULT NOTE - Follow Up Consult  Pharmacy Consult for Coumadin Indication: atrial fibrillation  Allergies  Allergen Reactions  . Amiodarone Other (See Comments)    Hyperthyroid on Amio in the past, changed to Multaq    Patient Measurements: Height: 6' (182.9 cm) Weight: 167 lb 1.7 oz (75.8 kg) IBW/kg (Calculated) : 77.6 Heparin Dosing Weight:   Vital Signs: Temp: 97.7 F (36.5 C) (05/15 0503) Temp Source: Oral (05/15 0503) BP: 116/83 mmHg (05/15 0503) Pulse Rate: 113 (05/15 1037)  Labs:  Recent Labs  03/10/15 0435 03/10/15 1030 03/11/15 0526 03/12/15 0630  HGB 11.1*  --  10.5* 9.4*  HCT 34.0*  --  33.1* 28.9*  PLT 176  --  183 173  LABPROT  --  41.0* 41.6* 38.8*  INR  --  4.22* 4.30* 3.94*  CREATININE 1.39*  --  1.30* 1.07    Estimated Creatinine Clearance: 50.2 mL/min (by C-G formula based on Cr of 1.07).   Medications:  Scheduled:  . carbidopa-levodopa  1 tablet Oral QID  . cefTRIAXone (ROCEPHIN)  IV  1 g Intravenous Q24H  . dronedarone  400 mg Oral BID WC  . lactose free nutrition  237 mL Oral TID WC  . loratadine  10 mg Oral Daily  . metoprolol  5 mg Intravenous 4 times per day  . metoprolol tartrate  25 mg Oral BID  . pravastatin  40 mg Oral q1800  . sertraline  25 mg Oral Daily  . sodium chloride  500 mL Intravenous Once  . sodium chloride  3 mL Intravenous Q12H  . sodium chloride  3 mL Intravenous Q12H  . Warfarin - Pharmacist Dosing Inpatient   Does not apply q1800   Assessment: 81yo male with PAF. INR is currently above goal and down today as expected. No bleeding noted and Hg stable.  Goal of Therapy:  INR 2-3 Monitor platelets by anticoagulation protocol: Yes  Plan:  No Coumadin today F/U in AM  Marisue Humble, PharmD Clinical Pharmacist [facility]- [facility]

## 2015-03-12 NOTE — Progress Notes (Signed)
PROGRESS NOTE  Ronald Payne WJX:[id number] DOB: [birth year] DOA: 03/08/2015 PCP: Aura Dials, MD  HPI/Recap of past 24 hours:  Intermittent confusion, Less tearful today, reported no diarrhea today, reported less tremor today Seems more interactive,   wife at bedside.  Assessment/Plan: Principal Problem:   Acute encephalopathy Active Problems:   Chronic anticoagulation   Diabetes mellitus   Dyslipidemia   BPH (benign prostatic hyperplasia), Dr Vernie Ammons follows   UTI (lower urinary tract infection)   Paroxysmal atrial fibrillation   Dysphagia   Urinary retention   Hypothyroidism   Protein-calorie malnutrition, severe   Palliative care encounter Principal Problem:  1. Acute encephalopathy- Most Likely due to UTI, Improving Neuro checks likely underline parkinson's, started sinemet, reported less tremor, appreciate neurology input.  Active Problems:  2. UTI (lower urinary tract infection) Ecoli UTI, pan sensitive IV Rocephin from 5/12.    3. Paroxysmal atrial fibrillation/intermittent Afib/RVR Telemetry Monitoring On coumadin Rx, pharmacy to dose INT 3.94 5/15 Continue Multaq (Dronedarone) Rx,                           Betablocker, add iv lopressor due to afib/rvr on 5/15, not a candidate for amiodarone drip due to history of allergy, consider digoxin if rate remain elevated, d/c synthroid, tsh on the low side.    4. Chronic anticoagulation On coumadin Rx Monitor PT/INRs    5. Diabetes mellitus Hold Metformin Rx SSI coverage PRN HbA1C 5.8    6. Dyslipidemia Continue Statin  Rx ( Mevacor)    7. BPH (benign prostatic hyperplasia), Dr Vernie Ammons follows Hx    8. Dysphagia failed swallow eval, family to make decision continue diet for comfort or consider feeding tube, palliative care input appreciated                        Still in the process of making decision, patient currently choose to continue oral intake knowing the risk of aspiration                        Poor oral intake, start gentle hydration.   9. Urinary retention- Due to BPH Continue to Straight Cath every 6 hrs   10. Hypothyroidism-  TSH low normal, d/c synthroid due to tachycardia   11. DVT Prophylaxis On Coumadin Rx.      Code Status: remain full code until family make decision.   Family Communication:   Patient and family  Disposition Plan: remain inpatient  Time spent: >17mins   Consultants:  Neurology  Palliative care  Procedures:  MBS  Antibiotics:  Rocephin from admission   Objective: BP 116/83 mmHg  Pulse 113  Temp(Src) 97.7 F (36.5 C) (Oral)  Resp 16  Ht 6' (1.829 m)  Wt 75.8 kg (167 lb 1.7 oz)  BMI 22.66 kg/m2  SpO2 100%  Intake/Output Summary (Last 24 hours) at 03/12/15 1250 Last data filed at 03/12/15 0934  Gross per 24 hour  Intake   1320 ml  Output    660 ml  Net    660 ml   Filed Weights   03/09/15 0206 03/11/15 0539  Weight: 75.3 kg (166 lb 0.1 oz) 75.8 kg (167 lb 1.7 oz)    Exam:   General:  NAD, frail  Cardiovascular: IRRR  Respiratory: CTABL  Abdomen: Soft/ND/NT, positive BS  Musculoskeletal: No Edema  Neuro: intermittent confusion, labile mood. No focal deficit. Less tremor.  Data Reviewed: Basic Metabolic Panel:  Recent Labs Lab 03/08/15 2037 03/09/15 0555 03/10/15 0435 03/11/15 0526 03/11/15 1400 03/12/15 0630  NA 139 139 138 140  --  144  K 3.7 3.7 4.4 3.0*  --  3.6  CL  106 107 105 107  --  115*  CO2 23 22 20* 23  --  20*  GLUCOSE 121* 89 138* 129*  --  117*  BUN 18 16 22* 25*  --  25*  CREATININE 1.35* 1.07 1.39* 1.30*  --  1.07  CALCIUM 9.7 9.5 9.7 9.5  --  9.4  MG  --   --  2.0  --  1.9 2.2   Liver Function Tests: No results for input(s): AST, ALT, ALKPHOS, BILITOT, PROT, ALBUMIN in the last 168 hours. No results for input(s): LIPASE, AMYLASE in the last 168 hours. No results for input(s): AMMONIA in the last 168 hours. CBC:  Recent Labs Lab 03/08/15 2037 03/09/15 1010 03/10/15 0435 03/11/15 0526 03/12/15 0630  WBC 11.7* 13.1* 13.6* 10.1 5.5  NEUTROABS 9.6*  --   --   --   --   HGB 10.2* 10.7* 11.1* 10.5* 9.4*  HCT 31.2* 33.0* 34.0* 33.1* 28.9*  MCV 91.0 91.2 90.4 91.2 91.2  PLT 181 167 176 183 173   Cardiac Enzymes:   No results for input(s): CKTOTAL, CKMB, CKMBINDEX, TROPONINI in the last 168 hours. BNP (last 3 results) No results for input(s): BNP in the last 8760 hours.  ProBNP (last 3 results) No results for input(s): PROBNP in the last 8760 hours.  CBG: No results for input(s): GLUCAP in the last 168 hours.  Recent Results (from the past 240 hour(s))  Gram stain     Status: None   Collection Time: 03/08/15 10:24 PM  Result Value Ref Range Status   Specimen Description URINE, CATHETERIZED  Final   Special Requests NONE  Final   Gram Stain   Final    WBC PRESENT,BOTH PMN AND MONONUCLEAR GRAM POSITIVE COCCI IN PAIRS AND CHAINS CYTOSPUN    Report Status 03/08/2015 FINAL  Final  Urine culture     Status: None   Collection Time: 03/08/15 10:24 PM  Result Value Ref Range Status   Specimen Description URINE, CATHETERIZED  Final   Special Requests NONE  Final   Colony Count   Final    >=100,000 COLONIES/ML Performed at [facility]    Culture   Final    ESCHERICHIA COLI Performed at [facility]    Report Status 03/11/2015 FINAL  Final   Organism ID, Bacteria ESCHERICHIA COLI  Final       Susceptibility   Escherichia coli - MIC*    AMPICILLIN <=2 SENSITIVE Sensitive     CEFAZOLIN <=4 SENSITIVE Sensitive     CEFTRIAXONE <=1 SENSITIVE Sensitive     CIPROFLOXACIN <=0.25 SENSITIVE Sensitive     GENTAMICIN <=1 SENSITIVE Sensitive     LEVOFLOXACIN <=0.12 SENSITIVE Sensitive     NITROFURANTOIN <=16 SENSITIVE Sensitive     TOBRAMYCIN <=1 SENSITIVE Sensitive     TRIMETH/SULFA <=20 SENSITIVE Sensitive     PIP/TAZO <=4 SENSITIVE Sensitive     * ESCHERICHIA COLI     Studies: Dg Chest 2 View  03/11/2015   CLINICAL DATA:  Hypoxia, confusion, diarrhea  EXAM: CHEST  2 VIEW  COMPARISON:  Radiograph 03/08/2015  FINDINGS: Normal cardiac silhouette with ectatic aorta. No effusion. There is fine airspace disease in the right lower lobe. Degenerate spurring of  spine. Hyperinflated lungs.  IMPRESSION: A fine airspace disease in the right lower lobe differential including asymmetric edema versus early pneumonia or aspiration pneumonitis.   Electronically Signed   By: Genevive Bi M.D.   On: 03/11/2015 17:20    Scheduled Meds: . carbidopa-levodopa  1 tablet Oral QID  . cefTRIAXone (ROCEPHIN)  IV  1 g Intravenous Q24H  . dronedarone  400 mg Oral BID WC  . lactose free nutrition  237 mL Oral TID WC  . loratadine  10 mg Oral Daily  . metoprolol  5 mg Intravenous 4 times per day  . metoprolol tartrate  25 mg Oral BID  . pravastatin  40 mg Oral q1800  . sertraline  25 mg Oral Daily  . sodium chloride  500 mL Intravenous Once  . sodium chloride  3 mL Intravenous Q12H  . sodium chloride  3 mL Intravenous Q12H  . Warfarin - Pharmacist Dosing Inpatient   Does not apply q1800    Continuous Infusions: . 0.9 % NaCl with KCl 20 mEq / L       Time spent: >54mins  Jayzon Taras MD, PhD  [facility] Pager [phone]. If 7PM-7AM, please contact night-coverage at [email or web address], password [email or web address] 03/12/2015, 12:50 PM  LOS: 3 days

## 2015-03-12 NOTE — Progress Notes (Signed)
Subjective: Wife feels that tremor is better. He was confused overnight.   Exam: Filed Vitals:   03/12/15 1037  BP:   Pulse: 113  Temp:   Resp:    Gen: In bed, NAD MS: Awake, alert MV:VKPQA, abnormal tongue movements which abate with intentional tongue movement(not fasciculations).  Motor: some improvemetn in tone.   Pertinent Labs: Elevated INR  Impression: 81 yo M who I suspect has parkinsons disease, thoguh difficult to be sure since patient is not in his [place] environment.   Recommendations: 1) continue sinemet 25-100 QID  Ritta Slot, MD [facility] [phone]  If 7pm- 7am, please page neurology on call as listed in AMION.

## 2015-03-12 NOTE — Progress Notes (Signed)
Patient ID: Ronald Payne, male   DOB: 05-16-1923, 81 y.o.   MRN: [id number]   Daily Progress Note   Patient Name: Ronald Payne       Date: 03/12/2015 DOB: [birth year]  Age: 81 y.o. MRN#: [id number] Attending Physician: Florencia Reasons, MD Primary Care Physician: Phineas Inches, MD Admit Date: 03/08/2015  Reason for Consultation/Follow-up: Establishing goals of care  Subjective: Pt is a 81 yo man with a h/o a-fib, HTN, HLD, BPH, recurrent UTI's, CVA and dysphagia adm with increased confusion and fever and chills. He was found to have another UTI and started on abx. He underwent a MBS which was concerning for progressive neuromuscular d/o. He was seen by neurology and started on sinemet He looks much improved today. Not tearful . Fed himself. Voice not as weak. Less rigidity noted to UE. Met with wife who reviewed "Hard Choices for Loving People" booklet last night. She discussed issues with her husband and sates he would not want a feeding tube nor would he want aggressive life support. She requested that a DNR be filled out during this hospitalization. Interval Events: Starting Sinemet and anti-depressant Length of Stay: 3 days  Current Medications: Scheduled Meds:  . carbidopa-levodopa  1 tablet Oral QID  . cefTRIAXone (ROCEPHIN)  IV  1 g Intravenous Q24H  . dronedarone  400 mg Oral BID WC  . lactose free nutrition  237 mL Oral TID WC  . loratadine  10 mg Oral Daily  . metoprolol  5 mg Intravenous 4 times per day  . metoprolol tartrate  25 mg Oral BID  . pravastatin  40 mg Oral q1800  . sertraline  25 mg Oral Daily  . sodium chloride  3 mL Intravenous Q12H  . sodium chloride  3 mL Intravenous Q12H  . Warfarin - Pharmacist Dosing Inpatient   Does not apply q1800    Continuous Infusions: . 0.9 % NaCl with KCl 20 mEq / L 75 mL/hr at 03/12/15 1442    PRN Meds: sodium chloride, acetaminophen **OR** acetaminophen, food thickener, LORazepam, sodium chloride, sodium  chloride  Palliative Performance Scale: 40%     Vital Signs: BP 116/83 mmHg  Pulse 113  Temp(Src) 97.7 F (36.5 C) (Oral)  Resp 16  Ht 6' (1.829 m)  Wt 75.8 kg (167 lb 1.7 oz)  BMI 22.66 kg/m2  SpO2 100% SpO2: SpO2: 100 % O2 Device: O2 Device: Nasal Cannula O2 Flow Rate: O2 Flow Rate (L/min): 2 L/min  Intake/output summary:  Intake/Output Summary (Last 24 hours) at 03/12/15 1524 Last data filed at 03/12/15 0934  Gross per 24 hour  Intake   1320 ml  Output    660 ml  Net    660 ml   LBM:   Baseline Weight: Weight: 75.3 kg (166 lb 0.1 oz) Most recent weight: Weight: 75.8 kg (167 lb 1.7 oz)  Physical Exam: General: Alert, more engaged. No distress HEENT: Tongue rolling Resp: Weak resp effort but clear Cardiac: RRR, distant Musculoskeletal: Upper extremities less stiff. Mild cog wheeling              Additional Data Reviewed: Recent Labs     03/11/15  0526  03/12/15  0630  WBC  10.1  5.5  HGB  10.5*  9.4*  PLT  183  173  NA  140  144  BUN  25*  25*  CREATININE  1.30*  1.07     Problem List:  Patient Active Problem List   Diagnosis Date  Noted  . Palliative care encounter   . Protein-calorie malnutrition, severe 03/10/2015  . Acute encephalopathy 03/09/2015  . Paroxysmal atrial fibrillation 03/09/2015  . Dysphagia 03/09/2015  . Urinary retention 03/09/2015  . Hypothyroidism 03/09/2015  . Dysphagia, pharyngoesophageal phase 03/06/2015  . Upper airway cough syndrome 03/06/2015  . Dyspnea 03/06/2015  . UTI (lower urinary tract infection) 01/14/2014  . Self-catheterizes urinary bladder 01/14/2014  . Orthostatic hypotension 01/14/2014  . PAF (paroxysmal atrial fibrillation), recurrent symptomatic AF this admission 11/06/2011  . NICM (nonischemic cardiomyopathy), 45-50% 2D 2013 11/06/2011  . Chronic anticoagulation 11/06/2011  . Diabetes mellitus 11/06/2011  . HTN (hypertension), hypotensive in AF 11/06/2011  . Dyslipidemia 11/06/2011  . BPH (benign  prostatic hyperplasia), Dr Karsten Ro follows 11/06/2011  . Atrial flutter, s/p RFA 12/08 11/06/2011  . Chronic renal insufficiency, stage III (moderate) 11/06/2011     Palliative Care Assessment & Plan    Code Status:  DNR  Goals of Care:  Code Status: Change to DNR  Feeding Tube: Would not want feeding tube if dysphagia worsens to the point whee he can no longer swallow  Desire for further Chaplaincy support: no   3. Symptom Management:  Depression: Continue Zoloft and titrate for effect.   Anxiety: Cont PRN ativan    5. Prognosis: > 6 - 12 months  5. Discharge Planning: [place] for rehab with Palliative care service follow-up   Care plan was discussed with wife.   Thank you for allowing the Palliative Medicine Team to assist in the care of this patient.   Time In: 1430 Time Out: 1515 Total Time 45 Prolonged Time Billed  no    Greater than 50%  of this time was spent counseling and coordinating care related to the above assessment and plan. When pt is ready for DC, likely will need skilled rehab for strength training with goal then to return home. Pt is a veteran and has [place], NP  03/12/2015, 3:24 PM  Please contact Palliative Medicine Team phone at [phone] for questions and concerns.

## 2015-03-12 NOTE — Progress Notes (Signed)
Patient refused to be I&O cath'd. He is sleeping pretty well and did not want to be messed with. Asked to do it 3 times.  Will try again later this AM.

## 2015-03-13 ENCOUNTER — Ambulatory Visit (HOSPITAL_COMMUNITY): Payer: Medicare Other

## 2015-03-13 ENCOUNTER — Inpatient Hospital Stay (HOSPITAL_COMMUNITY): Payer: Medicare Other

## 2015-03-13 ENCOUNTER — Other Ambulatory Visit (HOSPITAL_COMMUNITY): Payer: Self-pay

## 2015-03-13 ENCOUNTER — Telehealth: Payer: Self-pay | Admitting: *Deleted

## 2015-03-13 ENCOUNTER — Encounter (HOSPITAL_COMMUNITY): Payer: Self-pay | Admitting: Physician Assistant

## 2015-03-13 DIAGNOSIS — I482 Chronic atrial fibrillation: Secondary | ICD-10-CM

## 2015-03-13 LAB — BASIC METABOLIC PANEL
Anion gap: 12 (ref 5–15)
BUN: 25 mg/dL — AB (ref 6–20)
CALCIUM: 9.3 mg/dL (ref 8.9–10.3)
CHLORIDE: 116 mmol/L — AB (ref 101–111)
CO2: 15 mmol/L — ABNORMAL LOW (ref 22–32)
Creatinine, Ser: 1.09 mg/dL (ref 0.61–1.24)
GFR calc Af Amer: >60 mL/min (ref >60)
GFR calc non Af Amer: 58 mL/min — ABNORMAL LOW (ref >60)
Glucose, Bld: 136 mg/dL — ABNORMAL HIGH (ref 65–99)
Potassium: 4.1 mmol/L (ref 3.5–5.1)
Sodium: 143 mmol/L (ref 135–145)

## 2015-03-13 LAB — CBC
HCT: 30.9 % — ABNORMAL LOW (ref 39.0–52.0)
Hemoglobin: 9.9 g/dL — ABNORMAL LOW (ref 13.0–17.0)
MCH: 29.7 pg (ref 26.0–34.0)
MCHC: 32 g/dL (ref 30.0–36.0)
MCV: 92.8 fL (ref 78.0–100.0)
PLATELETS: 220 10*3/uL (ref 150–400)
RBC: 3.33 MIL/uL — ABNORMAL LOW (ref 4.22–5.81)
RDW: 14.8 % (ref 11.5–15.5)
WBC: 7.9 10*3/uL (ref 4.0–10.5)

## 2015-03-13 LAB — PROTIME-INR
INR: 3.56 — AB (ref 0.00–1.49)
Prothrombin Time: 35.8 seconds — ABNORMAL HIGH (ref 11.6–15.2)

## 2015-03-13 MED ORDER — METOPROLOL TARTRATE 25 MG PO TABS
25.0000 mg | ORAL_TABLET | Freq: Two times a day (BID) | ORAL | Status: DC
  Administered 2015-03-13: 25 mg via ORAL
  Filled 2015-03-13 (×2): qty 1

## 2015-03-13 MED ORDER — LORAZEPAM 2 MG/ML IJ SOLN
1.0000 mg | Freq: Four times a day (QID) | INTRAMUSCULAR | Status: DC | PRN
  Administered 2015-03-13: 1 mg via INTRAVENOUS
  Filled 2015-03-13: qty 1

## 2015-03-13 MED ORDER — METOPROLOL TARTRATE 25 MG PO TABS
37.5000 mg | ORAL_TABLET | Freq: Two times a day (BID) | ORAL | Status: DC
  Administered 2015-03-13 – 2015-03-14 (×2): 37.5 mg via ORAL
  Filled 2015-03-13 (×5): qty 1

## 2015-03-13 MED ORDER — METOPROLOL TARTRATE 1 MG/ML IV SOLN
5.0000 mg | Freq: Four times a day (QID) | INTRAVENOUS | Status: DC | PRN
  Administered 2015-03-14: 5 mg via INTRAVENOUS
  Filled 2015-03-13 (×3): qty 5

## 2015-03-13 MED ORDER — LACTATED RINGERS IV BOLUS (SEPSIS)
500.0000 mL | Freq: Once | INTRAVENOUS | Status: AC
  Administered 2015-03-13: 500 mL via INTRAVENOUS

## 2015-03-13 NOTE — Clinical Social Work Placement (Signed)
   CLINICAL SOCIAL WORK PLACEMENT  NOTE  Date:  03/13/2015  Patient Details  Name: CORMAC FELIZ MRN: [id number] Date of Birth: 03-Oct-1923  Clinical Social Work is seeking post-discharge placement for this patient at the Skilled  Nursing Facility level of care (*CSW will initial, date and re-position this form in  chart as items are completed):  Yes   Patient/family provided with [facility] Clinical Social Work Department's list of facilities offering this level of care within the geographic area requested by the patient (or if unable, by the patient's family).  Yes   Patient/family informed of their freedom to choose among providers that offer the needed level of care, that participate in Medicare, Medicaid or managed care program needed by the patient, have an available bed and are willing to accept the patient.  Yes   Patient/family informed of [facility]'s ownership interest in [facility] and [facility], as well as of the fact that they are under no obligation to receive care at these facilities.  PASRR submitted to EDS on 03/13/15     PASRR number received on 03/13/15     Existing PASRR number confirmed on       FL2 transmitted to all facilities in geographic area requested by pt/family on 03/13/15     FL2 transmitted to all facilities within larger geographic area on       Patient informed that his/her managed care company has contracts with or will negotiate with certain facilities, including the following:            Patient/family informed of bed offers received.  Patient chooses bed at       Physician recommends and patient chooses bed at      Patient to be transferred to   on  .  Patient to be transferred to facility by       Patient family notified on   of transfer.  Name of family member notified:        PHYSICIAN Please sign FL2, Please sign DNR     Additional Comment:    Macario Golds, LCSW [phone]

## 2015-03-13 NOTE — Progress Notes (Signed)
Dr. Roda Shutters notified about chest Xray results.

## 2015-03-13 NOTE — Progress Notes (Signed)
Physical Therapy Treatment Patient Details Name: Ronald Payne MRN: [id number] DOB: [birth year] Today's Date: 03/13/2015    History of Present Illness 81 y.o. male with h/o a fib, HTN, DM, urinary retention -self catheterizes, chronic dysphagia admitted with confusion, fever, chills. Dx of acute encephalopathy 2* UTI.     PT Comments    Pt more confused today and weak and fatigued. Do not feel pt's wife can manage him at this level and she is agreeable to ST-SNF.  Follow Up Recommendations  SNF     Equipment Recommendations  None recommended by PT    Recommendations for Other Services OT consult     Precautions / Restrictions Precautions Precautions: Fall Precaution Comments: wife reports 1 fall in past year Restrictions Weight Bearing Restrictions: No    Mobility  Bed Mobility Overal bed mobility: Needs Assistance Bed Mobility: Supine to Sit     Supine to sit: Mod assist;HOB elevated     General bed mobility comments: Assist to bring legs over and to elevate trunk.  Transfers Overall transfer level: Needs assistance Equipment used: Rolling walker (2 wheeled) Transfers: Sit to/from [employer] Sit to Stand: Mod assist;+2 physical assistance Stand pivot transfers: +2 physical assistance;Mod assist       General transfer comment: Assist to bring hips up and shift weight anteriorly. Pt with posterior lean and flexed posture.  Ambulation/Gait Ambulation/Gait assistance: +2 physical assistance;Mod assist   Assistive device: Rolling walker (2 wheeled) Gait Pattern/deviations: Shuffle;Trunk flexed     General Gait Details: Pivotal steps to [facility] and then to recliner. Pt incontinent of stool when stood up and pivoting.   Stairs            Wheelchair Mobility    Modified Rankin (Stroke Patients Only)       Balance   Sitting-balance support: No upper extremity supported;Feet supported Sitting balance-Leahy Scale: Fair      Standing balance support: Bilateral upper extremity supported Standing balance-Leahy Scale: Poor Standing balance comment: walker and mod A                    Cognition Arousal/Alertness: Lethargic Behavior During Therapy: Flat affect Overall Cognitive Status: Impaired/Different from baseline Area of Impairment: Memory;Following commands;Orientation;Attention;Problem solving Orientation Level: Disoriented to;Place;Time;Situation Current Attention Level: Sustained Memory: Decreased short-term memory Following Commands: Follows one step commands with increased time     Problem Solving: Slow processing;Decreased initiation;Requires verbal cues;Requires tactile cues General Comments: Wife reports his cognition is worse than his baseline.    Exercises      General Comments        Pertinent Vitals/Pain      Home Living                      Prior Function            PT Goals (current goals can now be found in the care plan section) Acute Rehab PT Goals Patient Stated Goal: go back home Progress towards PT goals: Not progressing toward goals - comment (weakness and confusion)    Frequency  Min 3X/week    PT Plan Discharge plan needs to be updated    Co-evaluation PT/OT/SLP Co-Evaluation/Treatment: Yes Reason for Co-Treatment: For patient/therapist safety PT goals addressed during session: Mobility/safety with mobility OT goals addressed during session: ADL's and self-care;Strengthening/ROM     End of Session Equipment Utilized During Treatment: Gait belt Activity Tolerance: Patient limited by fatigue (Limited by stool incontinence) Patient left: in  chair;with call bell/phone within reach;with family/visitor present     Time: 1300-1316 PT Time Calculation (min) (ACUTE ONLY): 16 min  Charges:  $Gait Training: 8-22 mins                    G Codes:      Matti Minney 03/18/15, 3:35 PM  [facility] PT [phone]

## 2015-03-13 NOTE — Clinical Social Work Note (Signed)
Clinical Social Work Assessment  Patient Details  Name: Ronald Payne MRN: [id number] Date of Birth: 1923/01/22  Date of referral:  03/13/15               Reason for consult:  Discharge Planning, Facility Placement                Permission sought to share information with:  Family Supports Permission granted to share information::  Yes, Verbal Permission Granted  Name::     Bunnie Lederman  Relationship::  Spouse  Contact Information:  [phone]  Housing/Transportation Living arrangements for the past 2 months:  Single Family Home Source of Information:  Spouse, Adult Children Patient Interpreter Needed:  None Criminal Activity/Legal Involvement Pertinent to Current Situation/Hospitalization:  No - Comment as needed Significant Relationships:  Spouse, Adult Children Lives with:  Spouse Do you feel safe going back to the place where you live?  Yes Need for family participation in patient care:  Yes (Comment)  Care giving concerns:  Patient spouse and daughter verbalize concerns regarding patient orientation and need for constant reminders about where he is and what is currently happening.  Patient wife is hopeful that once patient becomes established in a more concrete environment his ability to appropriately communicate will improve.   Social Worker assessment / plan:  Holiday representative met with patient spouse and daughter outside of room while patient on bedside commode to offer support and discuss discharge planning needs.  Patient family states that patient has been living at home with spouse who has been assisting patient with transfers and ADL's for the past several months.  Patient spouse and daughter are agreeable with SNF placement in [facility] - patient family made mention of [facility] or [facility], however will await bed offers to determine bed choice.  Patient spouse requests that patient be followed at SNF by Palliative Care to continue to assist with  comfort/pain.  CSW will follow up with patient family regarding available bed offers and facilitate patient discharge needs once medically stable.  Employment status:  Retired Forensic scientist:  Medicare PT Recommendations:  [place] / Referral to community resources:  [place]  Patient/Family's Response to care:  Patient family very realistic regarding patient potential long term needs and need for placement upon discharge.  Patient family has verbalized their desires for patient to remain comfortable, but would still like for him to participate in rehab.  Patient/Family's Understanding of and Emotional Response to Diagnosis, Current Treatment, and Prognosis:  Patient's family understanding of CSW role and involvement in patient care.  Patient family is appreciative of hospital staff continued efforts to provide necessary support to patient and family.  Patient family coping with Parkinson's diagnosis but express a sense of peace now knowing the cause of patient tremors.  Emotional Assessment Appearance:  Appears stated age Attitude/Demeanor/Rapport:  Unable to Assess (Patient on bedside commode) Affect (typically observed):  Unable to Assess (Per patient family, patient seems to be experiencing some delirium) Orientation:  Oriented to Self Alcohol / Substance use:  Not Applicable Psych involvement (Current and /or in the community):  No (Comment)  Discharge Needs  Concerns to be addressed:  Discharge Planning Concerns, Care Coordination Readmission within the last 30 days:  No Current discharge risk:  Dependent with Mobility Barriers to Discharge:  Continued Medical Work up   [employer], [place]

## 2015-03-13 NOTE — Progress Notes (Signed)
Occupational Therapy Treatment Patient Details Name: Ronald Payne MRN: [id number] DOB: [birth year] Today's Date: 03/13/2015    History of present illness 81 y.o. male with h/o a fib, HTN, DM, urinary retention -self catheterizes, chronic dysphagia admitted with confusion, fever, chills. Dx of acute encephalopathy 2* UTI.    OT comments  Patient progressing slowly towards OT goals, continue plan of care for now. Patient with incontinence of stool during sit<>sands and unable to control. Wife with concerns due to patient with decrease in cognition, she states it is different/worse than his baseline. Patient's 02 sats remained greater than 90% while on RA, HR elevated throughout session; RN aware of this.    Follow Up Recommendations  Supervision/Assistance - 24 hour;SNF    Equipment Recommendations  Other (comment) (TBD next venue of care)    Recommendations for Other Services  None at this time  Precautions / Restrictions Precautions Precautions: Fall Precaution Comments: wife reports 1 fall in past year Restrictions Weight Bearing Restrictions: No     Mobility Bed Mobility Overal bed mobility: Needs Assistance Bed Mobility: Supine to Sit Rolling: Mod assist Sidelying to sit: Mod assist Supine to sit: Mod assist;HOB elevated General bed mobility comments: Assist to bring legs over and to elevate trunk.  Transfers Overall transfer level: Needs assistance Equipment used: Rolling walker (2 wheeled) Transfers: Sit to/from Stand Sit to Stand: Mod assist;+2 physical assistance Stand pivot transfers: +2 physical assistance;Mod assist  General transfer comment: Assist to bring hips up and shift weight anteriorly. Pt with posterior lean and flexed posture.    Balance Overall balance assessment: Needs assistance Sitting-balance support: No upper extremity supported;Feet supported Sitting balance-Leahy Scale: Fair     Standing balance support: Bilateral upper extremity  supported;During functional activity Standing balance-Leahy Scale: Poor Standing balance comment: walker and mod A   ADL Overall ADL's : Needs assistance/impaired General ADL Comments: Patient required mod encouragement to participate in therapy today. Patient engaged in bed mobility with mod assist (+2 present for safety). Patient then transferred EOB > BSC (with incontinence) with mod assist (+2 for safety). Patient then transferred > recliner with mod assist (+2 for safety). Left patient seated in recliner with chair alarm set.      Cognition   Behavior During Therapy: Flat affect Overall Cognitive Status: Impaired/Different from baseline Area of Impairment: Memory;Following commands;Orientation;Attention;Problem solving Orientation Level: Disoriented to;Place;Time;Situation Current Attention Level: Sustained Memory: Decreased short-term memory  Following Commands: Follows one step commands with increased time     Problem Solving: Slow processing;Decreased initiation;Requires verbal cues;Requires tactile cues General Comments: Wife reports his cognition is worse than his baseline.                 Pertinent Vitals/ Pain       Pain Assessment: No/denies pain         Frequency Min 2X/week     Progress Toward Goals  OT Goals(current goals can now be found in the care plan section)  Progress towards OT goals: Progressing toward goals  Acute Rehab OT Goals Patient Stated Goal: go home!  Plan Discharge plan needs to be updated    Co-evaluation    PT/OT/SLP Co-Evaluation/Treatment: Yes Reason for Co-Treatment: For patient/therapist safety PT goals addressed during session: Mobility/safety with mobility OT goals addressed during session: ADL's and self-care;Strengthening/ROM      End of Session Equipment Utilized During Treatment: Gait belt;Rolling walker   Activity Tolerance Patient tolerated treatment well   Patient Left in chair;with call bell/phone within  reach;with  family/visitor present;with chair alarm set     Time: 1457-1520 OT Time Calculation (min): 23 min  Charges: OT General Charges $OT Visit: 1 Procedure OT Treatments $Self Care/Home Management : 8-22 mins  Jagger Beahm , MS, OTR/L, CLT Pager: [phone]  03/13/2015, 3:43 PM

## 2015-03-13 NOTE — Progress Notes (Signed)
Patient being physically aggressive with nursing staff assisting patient back to bed in attempts to do in and out prn catheter. Patient grabbing at nurses, ripping off badges and swearing. Attempted to console patient and he refusing to work with staff.  Dr. Roda Shutters notified and orders received.

## 2015-03-13 NOTE — Progress Notes (Signed)
PROGRESS NOTE  Ronald Payne ZOX:[id number] DOB: [birth year] DOA: 03/08/2015 PCP: Aura Dials, MD  HPI/Recap of past 24 hours:  Not oriented, Less tearful today, noticed congested cough, reported continued diarrhea  reported less tremor today   wife and daughter at bedside.  Assessment/Plan: Principal Problem:   Acute encephalopathy Active Problems:   Chronic anticoagulation   Diabetes mellitus   Dyslipidemia   BPH (benign prostatic hyperplasia), Dr Vernie Ammons follows   UTI (lower urinary tract infection)   Paroxysmal atrial fibrillation   Dysphagia   Urinary retention   Hypothyroidism   Protein-calorie malnutrition, severe   Palliative care encounter Principal Problem:  1. Acute encephalopathy in the setting of uti/ acute illness, in addition to baseline dementia- , fluctuating mental status Neuro checks likely underline parkinson's, started sinemet, reported less tremor, appreciate neurology input.  Active Problems:  2. UTI (lower urinary tract infection) Ecoli UTI, pan sensitive IV Rocephin from 5/12.    3. Paroxysmal atrial fibrillation/intermittent Afib/RVR Telemetry Monitoring On coumadin Rx, pharmacy to dose INT 3.56 5/15 Continue Multaq (Dronedarone) Rx,                           d/c synthroid, tsh on the low side.                         uptitrate betablocker, cardiology consulted on 5/16.    4. Chronic anticoagulation On coumadin Rx Monitor PT/INRs    5. Diabetes mellitus Hold Metformin Rx SSI coverage PRN HbA1C 5.8    6. Dyslipidemia Continue Statin Rx (  Mevacor)    7. BPH (benign prostatic hyperplasia), Dr Vernie Ammons follows Hx    8. Dysphagia failed swallow eval, family decided no feeding tube, continue diet for comfort, palliative care input appreciated, not patient is DNR.                        patient currently choose to continue oral intake knowing the risk of aspiration                        Poor oral intake, start gentle hydration.                        Congested cough noted on 5/16, cxr 2view pending.   9. Urinary retention- Due to BPH Continue to Straight Cath every 6 hrs   10. Hypothyroidism-  TSH low normal, d/c synthroid due to tachycardia   11. DVT Prophylaxis On Coumadin Rx.      Code Status: DNR  Family Communication:   Patient and family  Disposition Plan: remain inpatient  Time spent: >73mins   Consultants:  Neurology  Palliative care  cardiology  Procedures:  MBS  Antibiotics:  Rocephin from admission   Objective: BP 122/88 mmHg  Pulse 94  Temp(Src) 98 F (36.7 C) (Oral)  Resp 22  Ht 6' (1.829 m)  Wt 75.8 kg (167 lb 1.7 oz)  BMI 22.66 kg/m2  SpO2 100%  Intake/Output Summary (Last 24 hours) at 03/13/15 1313 Last data filed at 03/13/15 1016  Gross per 24 hour  Intake 1871.25 ml  Output    695 ml  Net 1176.25 ml   Filed Weights   03/09/15 0206 03/11/15 0539  Weight: 75.3 kg (166 lb 0.1 oz) 75.8 kg (167 lb 1.7 oz)    Exam:  General:  NAD, frail  Cardiovascular: IRRR  Respiratory: CTABL  Abdomen: Soft/ND/NT, positive BS  Musculoskeletal: No Edema  Neuro: intermittent confusion, labile mood. No focal deficit. Less tremor.  Data Reviewed: Basic Metabolic Panel:  Recent Labs Lab 03/09/15 0555 03/10/15 0435 03/11/15 0526 03/11/15 1400 03/12/15 0630 03/13/15 0528  NA 139 138 140  --  144 143  K 3.7 4.4 3.0*  --  3.6 4.1   CL 107 105 107  --  115* 116*  CO2 22 20* 23  --  20* 15*  GLUCOSE 89 138* 129*  --  117* 136*  BUN 16 22* 25*  --  25* 25*  CREATININE 1.07 1.39* 1.30*  --  1.07 1.09  CALCIUM 9.5 9.7 9.5  --  9.4 9.3  MG  --  2.0  --  1.9 2.2  --    Liver Function Tests: No results for input(s): AST, ALT, ALKPHOS, BILITOT, PROT, ALBUMIN in the last 168 hours. No results for input(s): LIPASE, AMYLASE in the last 168 hours. No results for input(s): AMMONIA in the last 168 hours. CBC:  Recent Labs Lab 03/08/15 2037 03/09/15 1010 03/10/15 0435 03/11/15 0526 03/12/15 0630 03/13/15 0528  WBC 11.7* 13.1* 13.6* 10.1 5.5 7.9  NEUTROABS 9.6*  --   --   --   --   --   HGB 10.2* 10.7* 11.1* 10.5* 9.4* 9.9*  HCT 31.2* 33.0* 34.0* 33.1* 28.9* 30.9*  MCV 91.0 91.2 90.4 91.2 91.2 92.8  PLT 181 167 176 183 173 220   Cardiac Enzymes:   No results for input(s): CKTOTAL, CKMB, CKMBINDEX, TROPONINI in the last 168 hours. BNP (last 3 results) No results for input(s): BNP in the last 8760 hours.  ProBNP (last 3 results) No results for input(s): PROBNP in the last 8760 hours.  CBG: No results for input(s): GLUCAP in the last 168 hours.  Recent Results (from the past 240 hour(s))  Gram stain     Status: None   Collection Time: 03/08/15 10:24 PM  Result Value Ref Range Status   Specimen Description URINE, CATHETERIZED  Final   Special Requests NONE  Final   Gram Stain   Final    WBC PRESENT,BOTH PMN AND MONONUCLEAR GRAM POSITIVE COCCI IN PAIRS AND CHAINS CYTOSPUN    Report Status 03/08/2015 FINAL  Final  Urine culture     Status: None   Collection Time: 03/08/15 10:24 PM  Result Value Ref Range Status   Specimen Description URINE, CATHETERIZED  Final   Special Requests NONE  Final   Colony Count   Final    >=100,000 COLONIES/ML Performed at [facility]    Culture   Final    ESCHERICHIA COLI Performed at [facility]    Report Status 03/11/2015 FINAL  Final   Organism ID,  Bacteria ESCHERICHIA COLI  Final      Susceptibility   Escherichia coli - MIC*    AMPICILLIN <=2 SENSITIVE Sensitive     CEFAZOLIN <=4 SENSITIVE Sensitive     CEFTRIAXONE <=1 SENSITIVE Sensitive     CIPROFLOXACIN <=0.25 SENSITIVE Sensitive     GENTAMICIN <=1 SENSITIVE Sensitive     LEVOFLOXACIN <=0.12 SENSITIVE Sensitive     NITROFURANTOIN <=16 SENSITIVE Sensitive     TOBRAMYCIN <=1 SENSITIVE Sensitive     TRIMETH/SULFA <=20 SENSITIVE Sensitive     PIP/TAZO <=4 SENSITIVE Sensitive     * ESCHERICHIA COLI  Clostridium Difficile by PCR     Status: None  Collection Time: 03/12/15  1:52 PM  Result Value Ref Range Status   C difficile by pcr NEGATIVE NEGATIVE Final     Studies: No results found.  Scheduled Meds: . carbidopa-levodopa  1 tablet Oral QID  . cefTRIAXone (ROCEPHIN)  IV  1 g Intravenous Q24H  . dronedarone  400 mg Oral BID WC  . lactose free nutrition  237 mL Oral TID WC  . loratadine  10 mg Oral Daily  . metoprolol tartrate  37.5 mg Oral BID  . pravastatin  40 mg Oral q1800  . sertraline  25 mg Oral Daily  . sodium chloride  3 mL Intravenous Q12H  . sodium chloride  3 mL Intravenous Q12H  . Warfarin - Pharmacist Dosing Inpatient   Does not apply q1800    Continuous Infusions: . 0.9 % NaCl with KCl 20 mEq / L 75 mL/hr at 03/13/15 0321     Time spent: >19mins  Hayly Litsey MD, PhD  [facility] Pager [phone]. If 7PM-7AM, please contact night-coverage at [email or web address], password [facility] 03/13/2015, 1:13 PM  LOS: 4 days

## 2015-03-13 NOTE — Consult Note (Signed)
CARDIOLOGY CONSULT NOTE   Patient ID: Ronald Payne MRN: [id number] DOB/AGE: 1923-08-10 81 y.o.  Admit date: 03/08/2015  Primary Physician   Aura Dials, MD Primary Cardiologist   Ronald Payne Reason for Consultation     [id number] Ronald Payne is Ronald 81 y.o. year old male with Ronald history of PAF on Coumadin Rx, HTN, DM2, Hyperlipidemia, BPH, urinary retention requiring self-caths and NICM w/ EF 45-50% in 2013. He was admitted 05/12 with confusion and fever, felt secondary to UTI. Pt in afib, RVR on admission and cardiology asked to see.   Ronald Payne is currently confused and oriented to name and place. His wife and daughter are in the room and assist in providing information. Information was also obtained from records.  Ronald Payne developed increasing weakness and confusion over the last several days. He has not been ambulatory since last Thursday, 05/12. His symptoms worsened and he was hospitalized on 05/13. Both Ronald Payne and his wife deny any knowledge of an increased or irregular heart rate. He has had no palpitations, no presyncope or syncope. He was otherwise in his usual state of health until he began developing confusion and weakness last week.  He is still in atrial fibrillation, and is completely unaware of this. He denies shortness of breath or chest pain. His wife has been using thickener at home on his liquids for Ronald long time. He is to continue to take pills that are crushed in applesauce. His swallowing function is poor but he does not wish to have Ronald feeding tube or be intubated. He is now Ronald DNR. He was coughing and produced some yellow sputum.  Past Medical History  Diagnosis Date  . Shortness of breath   . Hypothyroidism   . Hypertension   . PAF (paroxysmal atrial fibrillation)     s/p afib ablation 2011  . Arthritis   . Diabetes mellitus     type 2  . Insomnia, unspecified   . Acute bronchitis   . Other and unspecified hyperlipidemia   . Unspecified  hearing loss   . Insomnia, unspecified   . Cataract   . Atrial flutter 2008    s/p ablation     Past Surgical History  Procedure Laterality Date  . Back surgery    . Colon surgery      2001  . Colectomy  2001    Ronald Payne  . Atrial flutter ablation  2008    Ronald Payne, at [facility]  . Atrial fibrillation ablation  2011    at [facility]    Allergies  Allergen Reactions  . Amiodarone Other (See Comments)    Hyperthyroid on Amio in the past, changed to Multaq    I have reviewed the patient's current medications . carbidopa-levodopa  1 tablet Oral QID  . cefTRIAXone (ROCEPHIN)  IV  1 g Intravenous Q24H  . dronedarone  400 mg Oral BID WC  . lactose free nutrition  237 mL Oral TID WC  . loratadine  10 mg Oral Daily  . metoprolol tartrate  37.5 mg Oral BID  . pravastatin  40 mg Oral q1800  . sertraline  25 mg Oral Daily  . sodium chloride  3 mL Intravenous Q12H  . sodium chloride  3 mL Intravenous Q12H  . Warfarin - Pharmacist Dosing Inpatient   Does not apply q1800   . 0.9 % NaCl with KCl 20 mEq / L 75 mL/hr at 03/13/15 0321   sodium chloride, acetaminophen **  OR** acetaminophen, food thickener, LORazepam, metoprolol, sodium chloride, sodium chloride  Medication Sig  cetirizine (ZYRTEC) 10 MG tablet Take 10 mg by mouth daily. Take 1 tablet daily for allergies.  COUMADIN 5 MG tablet TAKE 1 TABLET DAILY Patient taking differently: No sig reported  dronedarone (MULTAQ) 400 MG tablet Take 1 tablet (400 mg total) by mouth 2 (two) times daily with Ronald meal.  HYDROcodone-acetaminophen (NORCO/VICODIN) 5-325 MG per tablet Take 1 tablet by mouth every 6 (six) hours as needed for moderate pain.  levothyroxine (SYNTHROID, LEVOTHROID) 50 MCG tablet Take 50 mcg by mouth daily before breakfast.  lovastatin (MEVACOR) 40 MG tablet Take 40 mg by mouth at bedtime. Take 40 mg daily to control cholesterol.  metFORMIN (GLUCOPHAGE) 500 MG tablet Take 500 mg by mouth 2 (two) times daily with Ronald meal.     nitrofurantoin, macrocrystal-monohydrate, (MACROBID) 100 MG capsule Take 100 mg by mouth at bedtime.  polyethylene glycol (MIRALAX / GLYCOLAX) packet Take 17 g by mouth daily.  traZODone (DESYREL) 50 MG tablet Take 50 mg by mouth at bedtime.  lansoprazole (PREVACID) 3 mg/ml SUSP oral suspension 2 tsp Take 30- 60 min before your first and last meals of the day Patient not taking: Reported on 03/08/2015     History   Social History  . Marital Status: Married    Spouse Name: N/Ronald  . Number of Children: N/Ronald  . Years of Education: N/Ronald   Occupational History  . Retired    Social History Main Topics  . Smoking status: Former Smoker -- 0.50 packs/day for 55 years    Types: Cigarettes, Cigars    Quit date: 01/14/1994  . Smokeless tobacco: Never Used  . Alcohol Use: No  . Drug Use: No  . Sexual Activity: Not Currently   Other Topics Concern  . Not on file   Social History Narrative   Lives in [place], [place].    Family Status  Relation Status Death Age  . Mother Deceased 46    heart disease  . Father Deceased 54    unknown  . Sister Alive   . Brother Alive   . Sister Alive   . Sister Alive    Family History  Problem Relation Age of Onset  . Heart disease Mother      ROS:  Full 14 point review of systems complete and found to be negative unless listed above.  Physical Exam: Blood pressure 134/99, pulse 114, temperature 98.2 F (36.8 C), temperature source Oral, resp. rate 15, height 6' (1.829 m), weight 167 lb 1.7 oz (75.8 kg), SpO2 100 %.  General: Well developed, well nourished, male in no acute distress Head: Eyes PERRLA, No xanthomas.   Normocephalic and atraumatic, oropharynx without edema or exudate. Dentition: Poor Lungs: Rales bases Heart: Heart irregular rate and rhythm with S1, S2, no  murmur. pulses are 1-2 + all 4 extrem.   Neck: No carotid bruits. No lymphadenopathy.  JVD not elevated. Abdomen: Bowel sounds present, abdomen soft and non-tender without  masses or hernias noted. Msk:  No spine or cva tenderness. No weakness, no joint deformities or effusions. Extremities: No clubbing or cyanosis. No edema.  Neuro: Alert and oriented X 2. No focal deficits noted. Generalized weakness, confusion noted Skin: No rashes or lesions noted.  Labs:   Lab Results  Component Value Date   WBC 7.9 03/13/2015   HGB 9.9* 03/13/2015   HCT 30.9* 03/13/2015   MCV 92.8 03/13/2015   PLT 220 03/13/2015  Recent Labs  03/13/15 0528  INR 3.56*     Recent Labs Lab 03/13/15 0528  NA 143  K 4.1  CL 116*  CO2 15*  BUN 25*  CREATININE 1.09  CALCIUM 9.3  GLUCOSE 136*   MAGNESIUM  Date Value Ref Range Status  03/12/2015 2.2 1.7 - 2.4 mg/dL Final   TSH  Date/Time Value Ref Range Status  03/08/2015 08:37 PM 0.535 0.350 - 4.500 uIU/mL Final   Echo: 09/29/2007 SUMMARY - Overall left ventricular systolic function was at the lower    limits of normal. Left ventricular ejection fraction was    estimated to be 55 %. There was mild diffuse left ventricular    hypokinesis. Left ventricular wall thickness was increased in    Ronald pattern of moderate concentric hypertrophy. Left    ventricular diastolic function parameters were normal. - Aortic valve thickness was mildly increased. - Right ventricular size was at the upper limits of normal. The    estimated peak right ventricular systolic pressure was mildly    increased. - There was moderate tricuspid valvular regurgitation.  ECG:  Atrial fibrillation, rapid ventricular response  Radiology:  Dg Chest 2 View 03/11/2015   CLINICAL DATA:  Hypoxia, confusion, diarrhea  EXAM: CHEST  2 VIEW  COMPARISON:  Radiograph 03/08/2015  FINDINGS: Normal cardiac silhouette with ectatic aorta. No effusion. There is fine airspace disease in the right lower lobe. Degenerate spurring of spine. Hyperinflated lungs.  IMPRESSION: Ronald fine airspace disease in the right lower lobe  differential including asymmetric edema versus early pneumonia or aspiration pneumonitis.   Electronically Signed   By: Genevive Bi M.D.   On: 03/11/2015 17:20    ASSESSMENT AND PLAN:   The patient was seen today by Ronald. Tenny Payne, the patient evaluated and the data reviewed.     Paroxysmal atrial fibrillation - Heart rate was described as regular by Ronald Payne on 03/06/2015 - He was in atrial fibrillation with RVR on admission, likely secondary to his acute illness - No distinct symptoms attributable to the atrial fibrillation - Problems with thyroid in the past felt secondary to amiodarone and the amiodarone was discontinued, dose at the time it was discontinued is unclear. - On Coumadin anticoagulation and was therapeutic on admission with an INR of 3.05 (INR as high as 4.30 since admit) - For now, agree with continuing metoprolol as blood pressure will allow. Currently 37.5 mg twice Ronald day Otherwise, per IM, with productive cough will recheck CXR, may have aspiration PNA Principal Problem:   Acute encephalopathy Active Problems:   Chronic anticoagulation   Diabetes mellitus   Dyslipidemia   BPH (benign prostatic hyperplasia), Ronald Vernie Ammons follows   UTI (lower urinary tract infection)   Dysphagia   Urinary retention   Hypothyroidism   Protein-calorie malnutrition, severe   Palliative care encounter   SignedLeanna Battles 03/13/2015 12:02 PM Beeper [phone]  Co-Sign MD  Patient seen and examined  I agree with findings as noted above by R Barrett Patinet is an 81 yo who presents with fever, confusion.  Found to have UTI  In atrial fibrillation with RVR.  On Muttaq. INR has been above 3.   On exam, pt is confused  Not clear on where he is.  Cough prod of clear sputum   Lungs with rhonchi  Cardiac exam:  Irreg irreg  Ext No edema HR still not controlled  Agree with increase in lopressor (had been on Coreg 6.25 bid as outpt until recently)  Continue Multaq for now Would  continue anticoagulation for now.   I do not think he is Ronald candidate at present for cardioversion.    Dietrich Pates

## 2015-03-13 NOTE — Progress Notes (Signed)
Medicare Important Message given?  YES (If response is "NO", the following Medicare IM given date fields will be blank) Date Medicare IM given:  03/13/15 Medicare IM given by:  Kyana Aicher 

## 2015-03-13 NOTE — Progress Notes (Signed)
SLP Cancellation Note  Patient Details Name: Ronald Payne MRN: [id number] DOB: [birth year]   Cancelled treatment:       Reason Eval/Treat Not Completed: Fatigue/lethargy limiting ability to participate. Pt trying to rest. Discussed findings of MBS and aspiration risk directly with family. We also discussed possibility that therapy in combination with new medication for Parkinson's could help pt improve swallow function over time. They accept risk of aspiration and understand that nectar thick liquids will still be aspirated. They also plan for f/u at SNF with SLP for possible progress. Will follow acutely, but primary f/u to be done at SNF level.    Jaylaa Gallion, Riley Nearing 03/13/2015, 1:11 PM

## 2015-03-13 NOTE — Telephone Encounter (Signed)
PA code given still coming back invalid per [facility]. Called [facility] again and spent 15 min on phone. Lansoprazole is compounded for patient and was not processed originally as compound. Called [place] back and they re-ran and got approval. Nothing further needed.

## 2015-03-13 NOTE — Progress Notes (Signed)
Utilization review completed.  

## 2015-03-13 NOTE — Progress Notes (Signed)
ANTICOAGULATION CONSULT NOTE - Follow Up Consult  Pharmacy Consult for Coumadin Indication: atrial fibrillation  Allergies  Allergen Reactions  . Amiodarone Other (See Comments)    Hyperthyroid on Amio in the past, changed to Multaq    Patient Measurements: Height: 6' (182.9 cm) Weight: 167 lb 1.7 oz (75.8 kg) IBW/kg (Calculated) : 77.6  Vital Signs: Temp: 98.2 F (36.8 C) (05/16 0434) Temp Source: Oral (05/15 2141) BP: 113/69 mmHg (05/16 0434) Pulse Rate: 120 (05/16 0022)  Labs:  Recent Labs  03/11/15 0526 03/12/15 0630 03/13/15 0528  HGB 10.5* 9.4* 9.9*  HCT 33.1* 28.9* 30.9*  PLT 183 173 220  LABPROT 41.6* 38.8* 35.8*  INR 4.30* 3.94* 3.56*  CREATININE 1.30* 1.07 1.09    Estimated Creatinine Clearance: 49.3 mL/min (by C-G formula based on Cr of 1.09).   Medications:  Scheduled:  . carbidopa-levodopa  1 tablet Oral QID  . cefTRIAXone (ROCEPHIN)  IV  1 g Intravenous Q24H  . dronedarone  400 mg Oral BID WC  . lactose free nutrition  237 mL Oral TID WC  . loratadine  10 mg Oral Daily  . metoprolol  5 mg Intravenous 4 times per day  . pravastatin  40 mg Oral q1800  . sertraline  25 mg Oral Daily  . sodium chloride  3 mL Intravenous Q12H  . sodium chloride  3 mL Intravenous Q12H  . Warfarin - Pharmacist Dosing Inpatient   Does not apply q1800   Assessment: 81yo male with PAF. INR is currently above goal and down today as expected. No bleeding noted and Hg stable.  Goal of Therapy:  INR 2-3 Monitor platelets by anticoagulation protocol: Yes  Plan:  No Coumadin today F/U in AM  Thanks for allowing pharmacy to be a part of this patient's care.  Talbert Cage, PharmD Clinical Pharmacist, [phone]

## 2015-03-14 DIAGNOSIS — Z66 Do not resuscitate: Secondary | ICD-10-CM

## 2015-03-14 DIAGNOSIS — R627 Adult failure to thrive: Secondary | ICD-10-CM | POA: Insufficient documentation

## 2015-03-14 LAB — BASIC METABOLIC PANEL
Anion gap: 10 (ref 5–15)
BUN: 27 mg/dL — ABNORMAL HIGH (ref 6–20)
CO2: 18 mmol/L — ABNORMAL LOW (ref 22–32)
Calcium: 9.6 mg/dL (ref 8.9–10.3)
Chloride: 116 mmol/L — ABNORMAL HIGH (ref 101–111)
Creatinine, Ser: 1.24 mg/dL (ref 0.61–1.24)
GFR, EST AFRICAN AMERICAN: 58 mL/min — AB (ref >60)
GFR, EST NON AFRICAN AMERICAN: 50 mL/min — AB (ref >60)
Glucose, Bld: 121 mg/dL — ABNORMAL HIGH (ref 65–99)
Potassium: 3.9 mmol/L (ref 3.5–5.1)
SODIUM: 144 mmol/L (ref 135–145)

## 2015-03-14 MED ORDER — BOOST PLUS PO LIQD
237.0000 mL | Freq: Three times a day (TID) | ORAL | Status: DC | PRN
  Filled 2015-03-14 (×3): qty 237

## 2015-03-14 MED ORDER — SCOPOLAMINE 1 MG/3DAYS TD PT72
1.0000 | MEDICATED_PATCH | TRANSDERMAL | Status: DC
  Administered 2015-03-14: 1.5 mg via TRANSDERMAL
  Filled 2015-03-14: qty 1

## 2015-03-14 MED ORDER — MORPHINE SULFATE (CONCENTRATE) 10 MG/0.5ML PO SOLN: 5.0000 mg | ORAL | Status: DC | PRN

## 2015-03-14 MED ORDER — FUROSEMIDE 10 MG/ML IJ SOLN
20.0000 mg | Freq: Once | INTRAMUSCULAR | Status: AC
  Administered 2015-03-14: 20 mg via INTRAVENOUS
  Filled 2015-03-14: qty 2

## 2015-03-14 MED ORDER — LORAZEPAM 2 MG/ML IJ SOLN
0.5000 mg | INTRAMUSCULAR | Status: DC | PRN
  Administered 2015-03-15: 0.5 mg via INTRAVENOUS
  Filled 2015-03-14: qty 1

## 2015-03-14 MED ORDER — LORAZEPAM 2 MG/ML IJ SOLN: 0.5000 mg | Freq: Four times a day (QID) | INTRAMUSCULAR | Status: DC | PRN

## 2015-03-14 MED ORDER — OLANZAPINE 2.5 MG PO TABS
2.5000 mg | ORAL_TABLET | Freq: Every day | ORAL | Status: DC
Start: 2015-03-14 — End: 2015-03-15
  Filled 2015-03-14 (×2): qty 1

## 2015-03-14 MED ORDER — GLYCOPYRROLATE 0.2 MG/ML IJ SOLN
0.2000 mg | INTRAMUSCULAR | Status: DC | PRN
  Administered 2015-03-14: 0.2 mg via INTRAVENOUS
  Filled 2015-03-14 (×2): qty 1

## 2015-03-14 MED ORDER — CETYLPYRIDINIUM CHLORIDE 0.05 % MT LIQD
7.0000 mL | Freq: Two times a day (BID) | OROMUCOSAL | Status: DC
  Administered 2015-03-14: 7 mL via OROMUCOSAL

## 2015-03-14 NOTE — Progress Notes (Signed)
PROGRESS NOTE  Ronald Payne NKN:[id number] DOB: Feb 10, 1923 DOA: 03/08/2015 PCP: Aura Dials, MD  HPI/Recap of past 24 hours:  Drowsy with more congested cough,  Now on comfort measures, awaiting d/c to home hospice.   wife and daughter at bedside.  Assessment/Plan: Principal Problem:   Acute encephalopathy Active Problems:   Chronic anticoagulation   Diabetes mellitus   Dyslipidemia   BPH (benign prostatic hyperplasia), Dr Vernie Ammons follows   UTI (lower urinary tract infection)   Paroxysmal atrial fibrillation   Dysphagia   Urinary retention   Hypothyroidism   Protein-calorie malnutrition, severe   Palliative care encounter   DNR (do not resuscitate) Principal Problem:  1. Acute encephalopathy in the setting of uti/ acute illness, in addition to baseline dementia- , fluctuating mental status Neuro checks likely underline parkinson's, started sinemet, reported less tremor, appreciate neurology input.  Active Problems:  2. UTI (lower urinary tract infection) Ecoli UTI, pan sensitive IV Rocephin from 5/12-5/17    3. Paroxysmal atrial fibrillation/intermittent Afib/RVR d/c Telemetry Monitoring d/c coumadin Rx due to comfort measures  Continue Multaq (Dronedarone) Rx,                           d/c synthroid, tsh on the low side.                         uptitrate betablocker, cardiology consulted on 5/16 and signed off on 5/17, now patient on comfort measures.    4. Chronic anticoagulation d/c coumadin on 5/17   5. Diabetes mellitus d/c meds and labs   6. Dyslipidemia d/ed Statin Rx ( Mevacor)    7. BPH (benign prostatic hyperplasia),   Hx    8. Dysphagia failed swallow eval, family decided no feeding tube, continue diet for comfort, palliative care input appreciated, now patient is DNR and on comfort measures.                                               9. Urinary retention- Due to BPH Continue to Straight Cath every 6 hrs   10. Hypothyroidism-  TSH low normal, d/c synthroid due to tachycardia   11. DVT Prophylaxis on comfort measures    Code Status: DNR  Family Communication:   Patient and family  Disposition Plan: likely home with home hospice 5/18  Time spent: >3mins   Consultants:  Neurology  Palliative care  cardiology  Procedures:  MBS  Antibiotics:  Rocephin from admission to 5/17   Objective: BP 107/75 mmHg  Pulse 125  Temp(Src) 98.1 F (36.7 C) (Oral)  Resp 24  Ht 6' (1.829 m)  Wt 75.8 kg (167 lb 1.7 oz)  BMI 22.66 kg/m2  SpO2 92%  Intake/Output Summary (Last 24 hours) at 03/14/15 2112 Last data filed at 03/14/15 1856  Gross per 24 hour  Intake     15 ml  Output    200 ml  Net   -185 ml   Filed Weights   03/09/15 0206 03/11/15 0539  Weight: 75.3 kg (166 lb 0.1 oz) 75.8 kg (167 lb 1.7 oz)    Exam:   General:  NAD, frail  Cardiovascular: IRRR  Respiratory: CTABL  Abdomen: Soft/ND/NT, positive BS  Musculoskeletal: No Edema  Neuro: lethargic, not interactive  Data Reviewed: Basic Metabolic Panel:  Recent Labs Lab 03/10/15 0435 03/11/15 0526 03/11/15 1400 03/12/15 0630 03/13/15 0528 03/14/15 0436  NA 138 140  --  144 143 144  K 4.4 3.0*  --  3.6 4.1 3.9  CL 105 107  --  115* 116* 116*  CO2 20* 23  --  20* 15* 18*  GLUCOSE 138* 129*  --  117* 136* 121*  BUN 22* 25*  --  25* 25* 27*  CREATININE 1.39* 1.30*  --  1.07 1.09 1.24  CALCIUM 9.7 9.5  --  9.4 9.3 9.6  MG 2.0  --  1.9 2.2  --   --    Liver Function  Tests: No results for input(s): AST, ALT, ALKPHOS, BILITOT, PROT, ALBUMIN in the last 168 hours. No results for input(s): LIPASE, AMYLASE in the last 168 hours. No results for input(s): AMMONIA in the last 168 hours. CBC:  Recent Labs Lab 03/08/15 2037 03/09/15 1010 03/10/15 0435 03/11/15 0526 03/12/15 0630 03/13/15 0528  WBC 11.7* 13.1* 13.6* 10.1 5.5 7.9  NEUTROABS 9.6*  --   --   --   --   --   HGB 10.2* 10.7* 11.1* 10.5* 9.4* 9.9*  HCT 31.2* 33.0* 34.0* 33.1* 28.9* 30.9*  MCV 91.0 91.2 90.4 91.2 91.2 92.8  PLT 181 167 176 183 173 220   Cardiac Enzymes:   No results for input(s): CKTOTAL, CKMB, CKMBINDEX, TROPONINI in the last 168 hours. BNP (last 3 results) No results for input(s): BNP in the last 8760 hours.  ProBNP (last 3 results) No results for input(s): PROBNP in the last 8760 hours.  CBG: No results for input(s): GLUCAP in the last 168 hours.  Recent Results (from the past 240 hour(s))  Gram stain     Status: None   Collection Time: 03/08/15 10:24 PM  Result Value Ref Range Status   Specimen Description URINE, CATHETERIZED  Final   Special Requests NONE  Final   Gram Stain   Final    WBC PRESENT,BOTH PMN AND MONONUCLEAR GRAM POSITIVE COCCI IN PAIRS AND CHAINS CYTOSPUN    Report Status 03/08/2015 FINAL  Final  Urine culture     Status: None   Collection Time: 03/08/15 10:24 PM  Result Value Ref Range Status   Specimen Description URINE, CATHETERIZED  Final   Special Requests NONE  Final   Colony Count   Final    >=100,000 COLONIES/ML Performed at [facility]    Culture   Final    ESCHERICHIA COLI Performed at [facility]    Report Status 03/11/2015 FINAL  Final   Organism ID, Bacteria ESCHERICHIA COLI  Final      Susceptibility   Escherichia coli - MIC*    AMPICILLIN <=2 SENSITIVE Sensitive     CEFAZOLIN <=4 SENSITIVE Sensitive     CEFTRIAXONE <=1 SENSITIVE Sensitive     CIPROFLOXACIN <=0.25 SENSITIVE Sensitive     GENTAMICIN  <=1 SENSITIVE Sensitive     LEVOFLOXACIN <=0.12 SENSITIVE Sensitive     NITROFURANTOIN <=16 SENSITIVE Sensitive     TOBRAMYCIN <=1 SENSITIVE Sensitive     TRIMETH/SULFA <=20 SENSITIVE Sensitive     PIP/TAZO <=4 SENSITIVE Sensitive     * ESCHERICHIA COLI  Clostridium Difficile by PCR     Status: None   Collection Time: 03/12/15  1:52 PM  Result Value Ref Range Status   C difficile by pcr NEGATIVE NEGATIVE Final     Studies: Dg Chest 2 View  03/13/2015   CLINICAL DATA:  Short of breath.  Productive cough.  EXAM: CHEST  2 VIEW  COMPARISON:  03/11/2015.  FINDINGS: The cardiopericardial silhouette is within normal limits for projection. Aortic arch atherosclerosis. Basilar opacity is present that appears to represent a combination of airspace disease and atelectasis. Small bilateral pleural effusions layer dependently on the lateral view. The findings are most compatible with CHF.  There is an edge projected over the RIGHT upper lobe which is compatible with a skin fold or object overlying the patient rather than pneumothorax. Lung markings extend peripherally be on this edge.  IMPRESSION: Constellation of findings compatible with mild CHF. Superimposed pneumonia is difficult to exclude but not favored.   Electronically Signed   By: Andreas Newport M.D.   On: 03/13/2015 14:26    Scheduled Meds: . antiseptic oral rinse  7 mL Mouth Rinse BID  . carbidopa-levodopa  1 tablet Oral QID  . dronedarone  400 mg Oral BID WC  . metoprolol tartrate  37.5 mg Oral BID  . OLANZapine  2.5 mg Oral QHS  . scopolamine  1 patch Transdermal Q72H  . sertraline  25 mg Oral Daily  . sodium chloride  3 mL Intravenous Q12H  . sodium chloride  3 mL Intravenous Q12H    Continuous Infusions:     Time spent: >4mins  Eileen Kangas MD, PhD  [facility] Pager [phone]. If 7PM-7AM, please contact night-coverage at [email or web address], password [employer] 03/14/2015, 9:12 PM  LOS: 5 days

## 2015-03-14 NOTE — Progress Notes (Signed)
Dr. Roda Shutters paged of patient's HR 128 and concern for swallow safety at this time with taking PO pills. Patient lethargic and arousable to voice. Struggling to maintain secretions. Dr. Roda Shutters made aware by telephone.

## 2015-03-14 NOTE — Progress Notes (Signed)
Nutrition Brief Note  Chart reviewed. Pt now transitioning to comfort care.  No further nutrition interventions warranted at this time.  Please re-consult as needed.   Rheana Casebolt A. Maridee Slape, RD, LDN, CDE Pager: [phone] After hours Pager: [phone]  

## 2015-03-14 NOTE — Clinical Social Work Note (Signed)
CSW has made referral to residential hospice. Waiting for response. CSW will update family at bedside once CSW receives response.   Roddie Mc MSW, [place], [place], [phone]

## 2015-03-14 NOTE — Progress Notes (Signed)
Patient refused all medications tonight- b/p and pulse within range. NP notified.

## 2015-03-14 NOTE — Progress Notes (Signed)
Spoke with Jimmey Ralph, NP and verbal order okay to place foley catheter for comfort if patient does not tolerate condom catheter. Order placed

## 2015-03-14 NOTE — Progress Notes (Signed)
   After reviewing the chart - plan is home on Hospice.   [facility] will sign off.   Marykay Lex, M.D., M.S. Interventional Cardiologist   Pager # [phone]

## 2015-03-14 NOTE — Progress Notes (Signed)
Daily Progress Note   Patient Name: Ronald Payne       Date: 03/14/2015 DOB: 1923-04-03  Age: 81 y.o. MRN#: [id number] Attending Physician: Ronald Grates, MD Primary Care Physician: Ronald Dials, MD Admit Date: 03/08/2015  Reason for Consultation/Follow-up: Establishing goals of care  Subjective: Ronald Payne has continued to decline especially over the last 1-2 days. He seems to continue to become more confused and likely aspirating with altered mental status. His family (wife and 2 daughters) are at bedside. Unfortunately he has had a great decline and family definitely agree that he would not want a feeding tube - I agree this is appropriate. We discussed poor quality of life and the fact that we cannot fix his swallowing and that it is getting worse instead of better. They understand the medical limitations and have opted to transition to comfort care at this time. We all agree that he would be better served with these goals in hospice facility and I believe that he is a candidate given his decline and now desire for comfort (however they understand hospice will evaluate for approval).   Wife says he will be 65 y/o Thursday and has had a full and happy life. He retired from [employer] (served in [employer]) and also retired from another career. He enjoyed his yardwork and had a talent for tying a variety of knots from his time in [employer] (they say he even appears to be trying to tie knots with the bed sheets at times).   Interval Events: 5/17 Comfort care  Length of Stay: 5 days  Current Medications: Scheduled Meds:  . carbidopa-levodopa  1 tablet Oral QID  . dronedarone  400 mg Oral BID WC  . loratadine  10 mg Oral Daily  . metoprolol tartrate  37.5 mg Oral BID  . sertraline  25 mg Oral Daily  . sodium chloride  3 mL Intravenous Q12H  . sodium chloride  3 mL Intravenous Q12H    Continuous Infusions:    PRN Meds: sodium chloride, acetaminophen **OR** acetaminophen, food thickener,  lactose free nutrition, LORazepam, sodium chloride, sodium chloride  Palliative Performance Scale: 10-20%     Vital Signs: BP 105/92 mmHg  Pulse 128  Temp(Src) 98.2 F (36.8 C) (Oral)  Resp 16  Ht 6' (1.829 m)  Wt 75.8 kg (167 lb 1.7 oz)  BMI 22.66 kg/m2  SpO2 97% SpO2: SpO2: 97 % O2 Device: O2 Device: Not Delivered O2 Flow Rate: O2 Flow Rate (L/min): 2 L/min  Intake/output summary:  Intake/Output Summary (Last 24 hours) at 03/14/15 1018 Last data filed at 03/13/15 1843  Gross per 24 hour  Intake 491.75 ml  Output    300 ml  Net 191.75 ml   LBM:  5/16 Baseline Weight: Weight: 75.3 kg (166 lb 0.1 oz) Most recent weight: Weight: 75.8 kg (167 lb 1.7 oz)  Physical Exam: Gen: NAD, altering agitation and sleeping HEENT: [place]/AT, no JVD CV: Tachycardic Pulm: Increased upper airway congestion concerning for him not controlling secretions, CTA throughout Extremities: Bilat feet cool to touch, no edema Neuro: Frequently altering mental state, very confused, combative at times              Additional Data Reviewed: Recent Labs     03/12/15  0630  03/13/15  0528  03/14/15  0436  WBC  5.5  7.9   --   HGB  9.4*  9.9*   --   PLT  173  220   --  NA  144  143  144  BUN  25*  25*  27*  CREATININE  1.07  1.09  1.24     Problem List:  Patient Active Problem List   Diagnosis Date Noted  . Palliative care encounter   . Protein-calorie malnutrition, severe 03/10/2015  . Acute encephalopathy 03/09/2015  . Paroxysmal atrial fibrillation 03/09/2015  . Dysphagia 03/09/2015  . Urinary retention 03/09/2015  . Hypothyroidism 03/09/2015  . Dysphagia, pharyngoesophageal phase 03/06/2015  . Upper airway cough syndrome 03/06/2015  . Dyspnea 03/06/2015  . UTI (lower urinary tract infection) 01/14/2014  . Self-catheterizes urinary bladder 01/14/2014  . Orthostatic hypotension 01/14/2014  . PAF (paroxysmal atrial fibrillation), recurrent symptomatic AF this admission 11/06/2011  .  NICM (nonischemic cardiomyopathy), 45-50% 2D 2013 11/06/2011  . Chronic anticoagulation 11/06/2011  . Diabetes mellitus 11/06/2011  . HTN (hypertension), hypotensive in AF 11/06/2011  . Dyslipidemia 11/06/2011  . BPH (benign prostatic hyperplasia), Dr Ronald Payne follows 11/06/2011  . Atrial flutter, s/p RFA 12/08 11/06/2011  . Chronic renal insufficiency, stage III (moderate) 11/06/2011     Palliative Care Assessment & Plan    Code Status:  DNR  Goals of Care:  Comfort care. NO feeding tube - continue comfort feeds.   3. Symptom Management:  Secretions: Robinul 0.2 mg IV every 4 hours prn.   Pain/dyspnea: Roxanol 5 mg every 2 hours prn.  Agitation: Ativan 0.5 mg every 4 hours prn - family says that Ativan has working well to help keep him calm/comfortable.    4. Prognosis: Likely < 3 weeks  5. Discharge Planning: Hopeful for hospice facility.    Care plan was discussed with Dr. Roda Payne, Ronald Payne CSW, and Ronald Payne [facility]  Thank you for allowing the Palliative Medicine Team to assist in the care of this patient.   Time In: 0940 Time Out: 1015 Total Time Prolonged Time Billed  no     Greater than 50%  of this time was spent counseling and coordinating care related to the above assessment and plan.   Ronald Bold, NP  03/14/2015, 10:18 AM  Please contact Palliative Medicine Team phone at [phone] for questions and concerns.

## 2015-03-14 NOTE — Progress Notes (Signed)
Pt heartrate started going up into the 130's and 140's and sitting there so IV lopressor was given from pt prn med list and Heart rate is currently working it's way down at present.  Most recent read is around 119.  [facility] will take over and continue to monitor pt.

## 2015-03-15 DIAGNOSIS — E1169 Type 2 diabetes mellitus with other specified complication: Secondary | ICD-10-CM

## 2015-03-15 MED ORDER — OLANZAPINE 2.5 MG PO TABS: 2.5000 mg | ORAL_TABLET | Freq: Every day | ORAL | Status: AC

## 2015-03-15 MED ORDER — SERTRALINE HCL 25 MG PO TABS: 25.0000 mg | ORAL_TABLET | Freq: Every day | ORAL | Status: AC

## 2015-03-15 MED ORDER — MORPHINE SULFATE (CONCENTRATE) 10 MG/0.5ML PO SOLN: 5.0000 mg | ORAL | Status: AC | PRN

## 2015-03-15 MED ORDER — CARBIDOPA-LEVODOPA 25-100 MG PO TABS: 1.0000 | ORAL_TABLET | Freq: Four times a day (QID) | ORAL | Status: AC

## 2015-03-15 MED ORDER — METOPROLOL TARTRATE 37.5 MG PO TABS: 37.5000 mg | ORAL_TABLET | Freq: Two times a day (BID) | ORAL | Status: AC

## 2015-03-15 MED ORDER — LORAZEPAM 2 MG/ML PO CONC: 0.6000 mg | ORAL | Status: AC | PRN

## 2015-03-15 MED ORDER — SCOPOLAMINE 1 MG/3DAYS TD PT72: 1.0000 | MEDICATED_PATCH | TRANSDERMAL | Status: AC

## 2015-03-15 NOTE — Consult Note (Signed)
[facility] Liaison: [facility] room available for Mr. Cheuk today. Completed registration paper work with family yesterday. Dr. Kern Reap to assume care per family request. Will need DC summary faxed to [phone]. RN please call report to [phone]. Please arrange transport for Mr. Muckleroy to arrive before noon. CSW Beverely Pace aware of above. Thank you. Forrestine Him LCSW [phone]

## 2015-03-15 NOTE — Discharge Summary (Signed)
Physician Discharge Summary  Ronald Payne NWG:[id number] DOB: 1923-10-04 DOA: 03/08/2015  PCP: Aura Dials, MD  Admit date: 03/08/2015 Discharge date: 03/15/2015  Time spent: >30 minutes  Recommendations for Outpatient Follow-up:  Comfort care.  Discharge Diagnoses:  Principal Problem:   Acute encephalopathy Active Problems:   Chronic anticoagulation   Diabetes mellitus   Dyslipidemia   BPH (benign prostatic hyperplasia), Dr Vernie Ammons follows   UTI (lower urinary tract infection)   Paroxysmal atrial fibrillation   Dysphagia   Urinary retention   Hypothyroidism   Protein-calorie malnutrition, severe   Palliative care encounter   DNR (do not resuscitate)   FTT (failure to thrive) in adult   Discharge Condition: stable vital signs and in no acute distress. Will discharge to [facility] for further comfort care.  Diet recommendation: comfort feeding.  Filed Weights   03/09/15 0206 03/11/15 0539  Weight: 75.3 kg (166 lb 0.1 oz) 75.8 kg (167 lb 1.7 oz)    History of present illness:  81 y.o. male with a history of Paroxysmal Atrial Fibrillation on Coumadin Rx, HTN, DM2, Hyperlipidemia, BPH who presents to the ED with increased confusion since last night, and fever and chills that started at noon today. He has urinary retention and self caths himself and has frequent UTI's. He denies any history of a previous CVA but has chronic dysphagia and is on a soft diet at home and has to have his medications by liquid or crushed. He also drinks thickened liquids, his wife reports that he is scheduled to have a swallowing study.   Hospital Course:  1. Acute encephalopathy in the setting of uti/ acute illness, in addition to baseline dementia- , fluctuating mental status continue. Likely underline parkinson's, started on sinemet, reported less tremor, appreciate neurology input and rec's. Plan now is for comfort measures.    2. UTI (lower urinary  tract infection): patient with Ecoli UTI, pan sensitive. Received IV Rocephin from 5/12-5/17. No dysuria at discharge   3. Paroxysmal atrial fibrillation/intermittent Afib/RVR d/c Telemetry Monitoring d/c coumadin Rx due to comfort measures  Continue Multaq (Dronedarone) Rx, as recommended by cardiology   d/c synthroid, tsh on the low side.   4. Chronic anticoagulation d/c coumadin on comfort measures now   5. Diabetes mellitus d/c meds and labs   6. Dyslipidemia d/ed Statin Rx ( Mevacor)    7. BPH (benign prostatic hyperplasia), Hx: plan is for comfort measures now; no further meds to be initiated.    8. Dysphagia: patient has failed swallowing eval, family decided no to pursuit feeding tube, continue PO comfort feeding and omfort measures.    9. Urinary retention- Due to BPH Continue to Straight Cath every 6 hrs   10. Hypothyroidism- TSH low normal, d/c synthroid due to tachycardia and comfort measures approach   Procedures:  MBS  Consultations:  Neurology  Palliative care  cardiology  Discharge Exam: Filed Vitals:   03/15/15 0521  BP: 112/94  Pulse: 143  Temp: 98.2 F (36.8 C)  Resp: 16    General: NAD, frail; no fever.  Cardiovascular: irregular rate and rhythm ; no rubs or gallops  Respiratory: CTABL  Abdomen: Soft/ND/NT, positive BS  Musculoskeletal: No Edema  Neuro: intermittently lethargic, not interactive   Discharge Instructions   Discharge Instructions    Discharge instructions    Complete by:  As directed   Comfort feeding and comfort care          Current Discharge Medication List    START taking  these medications   Details  carbidopa-levodopa (SINEMET IR) 25-100 MG per tablet Take 1 tablet by mouth 4 (four)  times daily.    LORazepam (ATIVAN) 2 MG/ML concentrated solution Take 0.3 mLs (0.6 mg total) by mouth every 4 (four) hours as needed for anxiety (EOL agitation). Qty: 30 mL, Refills: 0    Metoprolol Tartrate 37.5 MG TABS Take 37.5 mg by mouth 2 (two) times daily.    Morphine Sulfate (MORPHINE CONCENTRATE) 10 MG/0.5ML SOLN concentrated solution Take 0.25 mLs (5 mg total) by mouth every 2 (two) hours as needed for severe pain or shortness of breath (RR > 25).    OLANZapine (ZYPREXA) 2.5 MG tablet Take 1 tablet (2.5 mg total) by mouth at bedtime.    scopolamine (TRANSDERM-SCOP) 1 MG/3DAYS Place 1 patch (1.5 mg total) onto the skin every 3 (three) days. Qty: 10 patch, Refills: 12    sertraline (ZOLOFT) 25 MG tablet Take 1 tablet (25 mg total) by mouth daily.      CONTINUE these medications which have NOT CHANGED   Details  cetirizine (ZYRTEC) 10 MG tablet Take 10 mg by mouth daily. Take 1 tablet daily for allergies.    dronedarone (MULTAQ) 400 MG tablet Take 1 tablet (400 mg total) by mouth 2 (two) times daily with a meal. Qty: 180 tablet, Refills: 2      STOP taking these medications     COUMADIN 5 MG tablet      HYDROcodone-acetaminophen (NORCO/VICODIN) 5-325 MG per tablet      levothyroxine (SYNTHROID, LEVOTHROID) 50 MCG tablet      lovastatin (MEVACOR) 40 MG tablet      metFORMIN (GLUCOPHAGE) 500 MG tablet      nitrofurantoin, macrocrystal-monohydrate, (MACROBID) 100 MG capsule      polyethylene glycol (MIRALAX / GLYCOLAX) packet      traZODone (DESYREL) 50 MG tablet      lansoprazole (PREVACID) 3 mg/ml SUSP oral suspension        Allergies  Allergen Reactions  . Amiodarone Other (See Comments)    Hyperthyroid on Amio in the past, changed to Multaq      The results of significant diagnostics from this hospitalization (including imaging, microbiology, ancillary and laboratory) are listed below for reference.    Significant Diagnostic Studies: Dg Chest 2  View  03/13/2015   CLINICAL DATA:  Short of breath.  Productive cough.  EXAM: CHEST  2 VIEW  COMPARISON:  03/11/2015.  FINDINGS: The cardiopericardial silhouette is within normal limits for projection. Aortic arch atherosclerosis. Basilar opacity is present that appears to represent a combination of airspace disease and atelectasis. Small bilateral pleural effusions layer dependently on the lateral view. The findings are most compatible with CHF.  There is an edge projected over the RIGHT upper lobe which is compatible with a skin fold or object overlying the patient rather than pneumothorax. Lung markings extend peripherally be on this edge.  IMPRESSION: Constellation of findings compatible with mild CHF. Superimposed pneumonia is difficult to exclude but not favored.   Electronically Signed   By: Andreas Newport M.D.   On: 03/13/2015 14:26   Dg Chest 2 View  03/11/2015   CLINICAL DATA:  Hypoxia, confusion, diarrhea  EXAM: CHEST  2 VIEW  COMPARISON:  Radiograph 03/08/2015  FINDINGS: Normal cardiac silhouette with ectatic aorta. No effusion. There is fine airspace disease in the right lower lobe. Degenerate spurring of spine. Hyperinflated lungs.  IMPRESSION: A fine airspace disease in the right lower lobe differential including asymmetric  edema versus early pneumonia or aspiration pneumonitis.   Electronically Signed   By: Genevive Bi M.D.   On: 03/11/2015 17:20   Dg Chest 2 View  03/08/2015   CLINICAL DATA:  Fever. Altered mental status. Hypertension. Diabetes.  EXAM: CHEST  2 VIEW  COMPARISON:  03/06/2015  FINDINGS: Normal heart size and pulmonary vascularity. No focal airspace disease or consolidation in the lungs. No blunting of costophrenic angles. No pneumothorax. Mediastinal contours appear intact. Calcified and tortuous aorta. Degenerative changes in the spine and shoulders.  IMPRESSION: No active cardiopulmonary disease.   Electronically Signed   By: Burman Nieves M.D.   On: 03/08/2015 22:04    Dg Chest 2 View  03/06/2015   CLINICAL DATA:  Cough and shortness of breath for 6 months. Hypertension. Diabetes.  EXAM: CHEST  2 VIEW  COMPARISON:  09/27/2014  FINDINGS: Lateral view degraded by patient arm position. Moderate to marked thoracic spondylosis.  Patient rotated to the right on the frontal radiograph, with the chin overlying the right apex. Midline trachea. Mild cardiomegaly with a markedly tortuous thoracic aorta, accentuated by obliquity. No pleural effusion or pneumothorax. No congestive failure. No lobar consolidation.  IMPRESSION: Suboptimal patient positioning on all views.  Cardiomegaly with a markedly tortuous thoracic aorta. No acute superimposed process.   Electronically Signed   By: Jeronimo Greaves M.D.   On: 03/06/2015 16:25   Ct Head Wo Contrast  03/09/2015   CLINICAL DATA:  Altered mental status and fever since noon.  EXAM: CT HEAD WITHOUT CONTRAST  TECHNIQUE: Contiguous axial images were obtained from the base of the skull through the vertex without intravenous contrast.  COMPARISON:  09/27/2014  FINDINGS: Diffuse cerebral atrophy. Ventricular dilatation consistent with central atrophy. Low-attenuation changes throughout the deep white matter consistent with small vessel ischemia. No mass effect or midline shift. No abnormal extra-axial fluid collections. Gray-white matter junctions are distinct. Basal cisterns are not effaced. No evidence of acute intracranial hemorrhage. No depressed skull fractures. Visualized paranasal sinuses and mastoid air cells are not opacified. Vascular calcifications. Old nasal bone fractures.  IMPRESSION: No acute intracranial abnormalities. Chronic atrophy and small vessel ischemic changes.   Electronically Signed   By: Burman Nieves M.D.   On: 03/09/2015 00:50   Dg Swallowing Func-speech Pathology  03/10/2015   Yehuda Savannah Deblois, CCC-SLP     03/10/2015  1:14 PM  Objective Swallowing Evaluation: Other (Comment)  Patient Details  Name: Ronald Payne MRN: [id number] Date of Birth: 1923-03-21  Today's Date: 03/10/2015 Time: SLP Start Time (ACUTE ONLY): 1020-SLP Stop Time (ACUTE  ONLY): 1040 SLP Time Calculation (min) (ACUTE ONLY): 20 min  Past Medical History:  Past Medical History  Diagnosis Date  . Shortness of breath   . Hypothyroidism   . Hypertension   . Dysrhythmia     atrial fibrilation  . Arthritis   . Diabetes mellitus     type 2  . Insomnia, unspecified   . Acute bronchitis   . Other and unspecified hyperlipidemia   . Unspecified hearing loss   . Insomnia, unspecified   . Cataract    Past Surgical History:  Past Surgical History  Procedure Laterality Date  . Back surgery    . Colon surgery      2001  . Colectomy  2001    Dr. Jamey Ripa   HPI:  Other Pertinent Information: DRESDEN LOZITO is a 81 y.o. male  with a history of Paroxysmal Atrial Fibrillation on Coumadin Rx,  HTN, DM2, Hyperlipidemia, BPH who presents to the ED with  increased confusion and fever and chills. He has urinary  retention and self caths himself and has frequent UTI's. He  denies any history of a previous CVA but has chronic dysphagia  and has had 2 MBS ince admission on 01/14/14 for UTI. Last MBS  recommended Dys 3 diet/nectar thick liquids.  Pt is scheduled for  an outpatient MBS on 5/16.   No Data Recorded  Assessment / Plan / Recommendation CHL IP CLINICAL IMPRESSIONS 03/10/2015  Therapy Diagnosis Severe pharyngeal phase dysphagia;Severe oral  phase dysphagia  Clinical Impression Pt demonstrates a severe to profound  dysphagia, significantly worsened over the last year since last  MBS. Presentation very concerning for a progressive neuromuscular  disorder.   Oral function is severey impaired with weak lingual maniuplation  and bolus formation with anterior spillage and oral residue (also  observed with basic secretion management resulting in drooling).  Swallow resonse is delayed and grossly weak with poor bolus  propulsion and airway closure with significant sensed and  silent  aspiration and severe residue present post swallow. Pt is unable  to transit residue with multiple swallows and postures and  strategies are not effective.   Given that pt has had a decline over the past 6 months and more  precipitous decline over the last month with pt and his wife  suspecting aspiration of his current diet, likely dysphagia was  severe even before pt developed AMS and UTI. Pt does not have any  acute pulmonay finding despite severity of aspiration. Pt may  chose to continue a diet despite high risk of aspiration,  recommend Dys 2/nectar if risk is accepted. Strongly suggest  neurology consult (discussed with MD). Palliative care would also  be of assistance to pt and family in making decisions regarding  goals of care.       CHL IP TREATMENT RECOMMENDATION 03/10/2015  Treatment Recommendations Therapy as outlined in treatment plan  below     CHL IP DIET RECOMMENDATION 03/10/2015  SLP Diet Recommendations NPO  Liquid Administration via (None)  Medication Administration Crushed with puree  Compensations Slow rate;Small sips/bites;Multiple dry swallows  after each bite/sip  Postural Changes and/or Swallow Maneuvers (None)     CHL IP OTHER RECOMMENDATIONS 03/10/2015  Recommended Consults Other (Comment)  Oral Care Recommendations Oral care BID  Other Recommendations Have oral suction available     CHL IP FOLLOW UP RECOMMENDATIONS 09/28/2014  Follow up Recommendations Home health SLP     CHL IP FREQUENCY AND DURATION 03/10/2015  Speech Therapy Frequency (ACUTE ONLY) (None)  Treatment Duration 2 weeks     Pertinent Vitals/Pain NA    SLP Swallow Goals No flowsheet data found.  No flowsheet data found.    CHL IP REASON FOR REFERRAL 03/10/2015  Reason for Referral (No Data)     CHL IP ORAL PHASE 03/10/2015  Lips (None)  Tongue (None)  Mucous membranes (None)  Nutritional status (None)  Other (None)  Oxygen therapy (None)  Oral Phase Impaired  Oral - Pudding Teaspoon (None)  Oral - Pudding Cup (None)  Oral  - Honey Teaspoon (None)  Oral - Honey Cup (None)  Oral - Honey Syringe (None)  Oral - Nectar Teaspoon (None)  Oral - Nectar Cup (None)  Oral - Nectar Straw (None)  Oral - Nectar Syringe (None)  Oral - Ice Chips (None)  Oral - Thin Teaspoon (None)  Oral - Thin Cup (None)  Oral - Thin  Straw (None)  Oral - Thin Syringe (None)  Oral - Puree (None)  Oral - Mechanical Soft (None)  Oral - Regular (None)  Oral - Multi-consistency (None)  Oral - Pill (None)  Oral Phase - Comment (None)      CHL IP PHARYNGEAL PHASE 03/10/2015  Pharyngeal Phase Impaired  Pharyngeal - Pudding Teaspoon (None)  Penetration/Aspiration details (pudding teaspoon) (None)  Pharyngeal - Pudding Cup (None) Penetration/Aspiration details  (pudding cup) (None)  Pharyngeal - Honey Teaspoon (None)  Penetration/Aspiration details (honey teaspoon) (None)  Pharyngeal - Honey Cup (None)  Penetration/Aspiration details (honey cup) (None)  Pharyngeal - Honey Syringe (None)  Penetration/Aspiration details (honey syringe) (None)  Pharyngeal - Nectar Teaspoon (None)  Penetration/Aspiration details (nectar teaspoon) (None)  Pharyngeal - Nectar Cup (None)  Penetration/Aspiration details (nectar cup) (None)  Pharyngeal - Nectar Straw (None)  Penetration/Aspiration details (nectar straw) (None)  Pharyngeal - Nectar Syringe (None)  Penetration/Aspiration details (nectar syringe) (None)  Pharyngeal - Ice Chips (None)  Penetration/Aspiration details (ice chips) (None)  Pharyngeal - Thin Teaspoon (None)  Penetration/Aspiration details (thin teaspoon) (None)  Pharyngeal - Thin Cup (None)  Penetration/Aspiration details (thin cup) (None)  Pharyngeal - Thin Straw (None)  Penetration/Aspiration details (thin straw) (None)  Pharyngeal - Thin Syringe (None)  Penetration/Aspiration details (thin syringe') (None)  Pharyngeal - Puree (None)  Penetration/Aspiration details (puree) (None)  Pharyngeal - Mechanical Soft (None)  Penetration/Aspiration details (mechanical soft) (None)   Pharyngeal - Regular (None)  Penetration/Aspiration details (regular) (None)  Pharyngeal - Multi-consistency (None)  Penetration/Aspiration details (multi-consistency) (None)  Pharyngeal - Pill (None)  Penetration/Aspiration details (pill) (None)  Pharyngeal Comment (None)      CHL IP CERVICAL ESOPHAGEAL PHASE 04/05/2014  Cervical Esophageal Phase WFL  Pudding Teaspoon (None)  Pudding Cup (None)  Honey Teaspoon (None)  Honey Cup (None)  Honey Straw (None)  Nectar Teaspoon (None)  Nectar Cup (None)  Nectar Straw (None)  Nectar Sippy Cup (None)  Thin Teaspoon (None)  Thin Cup (None)  Thin Straw (None)  Thin Sippy Cup (None)  Cervical Esophageal Comment (None)    CHL IP GO 04/05/2014  Functional Assessment Tool Used clinical judgement  Functional Limitations Swallowing  Swallow Current Status (Z6109) CK  Swallow Goal Status (U0454) CK  Swallow Discharge Status (U9811) CK  Motor Speech Current Status (B1478) (None)  Motor Speech Goal Status (G9562) (None)  Motor Speech Goal Status (Z3086) (None) Spoken Language  Comprehension Current Status (V7846) (None)  Spoken Language Comprehension Goal Status (N6295) (None)  Spoken Language Comprehension Discharge Status (M8413) (None)  Spoken Language Expression Current Status (K4401) (None)  Spoken Language Expression Goal Status (U2725) (None)  Spoken Language Expression Discharge Status [phone] (None)  Attention Current Status (I3474) (None)  Attention Goal Status (Q5956) (None)  Attention Discharge Status (L8756) (None)  Memory Current Status (E3329) (None)  Memory Goal Status (J1884) (None)  Memory Discharge Status (Z6606) (None)  Voice Current Status (T0160) (None)  Voice Goal Status (F0932) (None)  Voice Discharge Status (T5573) (None)  Other Speech-Language Pathology Functional Limitation (U2025)  (None)  Other Speech-Language Pathology Functional Limitation Goal Status  (K2706) (None)  Other Speech-Language Pathology Functional Limitation Discharge  Status [id number]) (None)          Harlon Ditty, MA CCC-SLP [phone]  DeBlois, Riley Nearing 03/10/2015, 1:11 PM     Microbiology: Recent Results (from the past 240 hour(s))  Gram stain     Status: None   Collection Time: 03/08/15 10:24 PM  Result Value Ref Range Status  Specimen Description URINE, CATHETERIZED  Final   Special Requests NONE  Final   Gram Stain   Final    WBC PRESENT,BOTH PMN AND MONONUCLEAR GRAM POSITIVE COCCI IN PAIRS AND CHAINS CYTOSPUN    Report Status 03/08/2015 FINAL  Final  Urine culture     Status: None   Collection Time: 03/08/15 10:24 PM  Result Value Ref Range Status   Specimen Description URINE, CATHETERIZED  Final   Special Requests NONE  Final   Colony Count   Final    >=100,000 COLONIES/ML Performed at [facility]    Culture   Final    ESCHERICHIA COLI Performed at [facility]    Report Status 03/11/2015 FINAL  Final   Organism ID, Bacteria ESCHERICHIA COLI  Final      Susceptibility   Escherichia coli - MIC*    AMPICILLIN <=2 SENSITIVE Sensitive     CEFAZOLIN <=4 SENSITIVE Sensitive     CEFTRIAXONE <=1 SENSITIVE Sensitive     CIPROFLOXACIN <=0.25 SENSITIVE Sensitive     GENTAMICIN <=1 SENSITIVE Sensitive     LEVOFLOXACIN <=0.12 SENSITIVE Sensitive     NITROFURANTOIN <=16 SENSITIVE Sensitive     TOBRAMYCIN <=1 SENSITIVE Sensitive     TRIMETH/SULFA <=20 SENSITIVE Sensitive     PIP/TAZO <=4 SENSITIVE Sensitive     * ESCHERICHIA COLI  Clostridium Difficile by PCR     Status: None   Collection Time: 03/12/15  1:52 PM  Result Value Ref Range Status   C difficile by pcr NEGATIVE NEGATIVE Final     Labs: Basic Metabolic Panel:  Recent Labs Lab 03/10/15 0435 03/11/15 0526 03/11/15 1400 03/12/15 0630 03/13/15 0528 03/14/15 0436  NA 138 140  --  144 143 144  K 4.4 3.0*  --  3.6 4.1 3.9  CL 105 107  --  115* 116* 116*  CO2 20* 23  --  20* 15* 18*  GLUCOSE 138* 129*  --  117* 136* 121*  BUN 22* 25*  --  25* 25* 27*  CREATININE 1.39* 1.30*   --  1.07 1.09 1.24  CALCIUM 9.7 9.5  --  9.4 9.3 9.6  MG 2.0  --  1.9 2.2  --   --    CBC:  Recent Labs Lab 03/08/15 2037 03/09/15 1010 03/10/15 0435 03/11/15 0526 03/12/15 0630 03/13/15 0528  WBC 11.7* 13.1* 13.6* 10.1 5.5 7.9  NEUTROABS 9.6*  --   --   --   --   --   HGB 10.2* 10.7* 11.1* 10.5* 9.4* 9.9*  HCT 31.2* 33.0* 34.0* 33.1* 28.9* 30.9*  MCV 91.0 91.2 90.4 91.2 91.2 92.8  PLT 181 167 176 183 173 220   Cardiac Enzymes: No results for input(s): CKTOTAL, CKMB, CKMBINDEX, TROPONINI in the last 168 hours. BNP: BNP (last 3 results) No results for input(s): BNP in the last 8760 hours.  Signed:  Vassie Loll  [facility] 03/15/2015, 9:44 AM

## 2015-03-15 NOTE — Progress Notes (Signed)
Patient fights with staff when he is touched. Pulse this am is 143- NP Schorr notified.

## 2015-03-15 NOTE — Progress Notes (Signed)
SLP Cancellation Note  Patient Details Name: PLEDGER DEBENEDETTI MRN: [id number] DOB: [birth year]   Cancelled treatment:       Reason Eval/Treat Not Completed: Medical issues which prohibited therapy. Given pts decline and plan for comfort measures will not pursue any further interventions. Education has been completed with pts family. Will sign off.    Lillyth Spong, Riley Nearing 03/15/2015, 10:21 AM

## 2015-03-15 NOTE — Clinical Social Work Note (Signed)
Per MD patient ready for DC to [facility]. RN, patient, patient's family (at bedside), and facility notified of DC. RN given number for report. DC packet on chart. Ambulance transport requested for patient. CSW signing off.    Roddie Mc MSW, [place], [place], [phone]

## 2015-03-15 NOTE — Care Management Note (Signed)
Case Management Note  Patient Details  Name: Ronald Payne MRN: [id number] Date of Birth: Sep 12, 1923  Subjective/Objective:       Patient to dc to [facility] today, CSW aware.              Action/Plan:   Expected Discharge Date:                  Expected Discharge Plan:  Hospice Medical Facility  In-House Referral:  Clinical Social Work  Discharge planning Services  CM Consult  Post Acute Care Choice:  Hospice Choice offered to:     DME Arranged:    DME Agency:     HH Arranged:    HH Agency:     Status of Service:  Completed, signed off  Medicare Important Message Given:  Yes Date Medicare IM Given:  03/13/15 Medicare IM give by:  Letha Cape RN Date Additional Medicare IM Given:    Additional Medicare Important Message give by:     If discussed at Long Length of Stay Meetings, dates discussed:  03/14/15  Additional Comments:  Leone Haven, RN 03/15/2015, 11:12 AM

## 2015-03-29 DEATH — deceased

## 2015-04-05 ENCOUNTER — Telehealth: Payer: Self-pay | Admitting: Cardiovascular Disease

## 2015-04-05 NOTE — Telephone Encounter (Signed)
Pt's wife called and wanted you to know that the patient passed away on 29-Mar-2015. She also wanted to thank you and the entire staff for his care and kindness during his years here.

## 2015-04-06 NOTE — Telephone Encounter (Signed)
Sorry to hear. Thank you

## 2015-04-10 ENCOUNTER — Ambulatory Visit: Payer: Self-pay | Admitting: Pharmacist Clinician (PhC)/ Clinical Pharmacy Specialist

## 2015-05-23 ENCOUNTER — Ambulatory Visit: Payer: Self-pay | Admitting: Pharmacist Clinician (PhC)/ Clinical Pharmacy Specialist

## 2015-05-23 DIAGNOSIS — I480 Paroxysmal atrial fibrillation: Secondary | ICD-10-CM

## 2015-05-23 DIAGNOSIS — Z7901 Long term (current) use of anticoagulants: Secondary | ICD-10-CM

## 2016-08-28 IMAGING — CR DG CHEST 2V
3 series · 3 of 3 positions shown · non-contrast
Comparison: 09/27/2014

CLINICAL DATA: Cough and shortness of breath for 6 months.
Hypertension. Diabetes.

EXAM:
CHEST  2 VIEW

[view not recorded (1 of 3)]
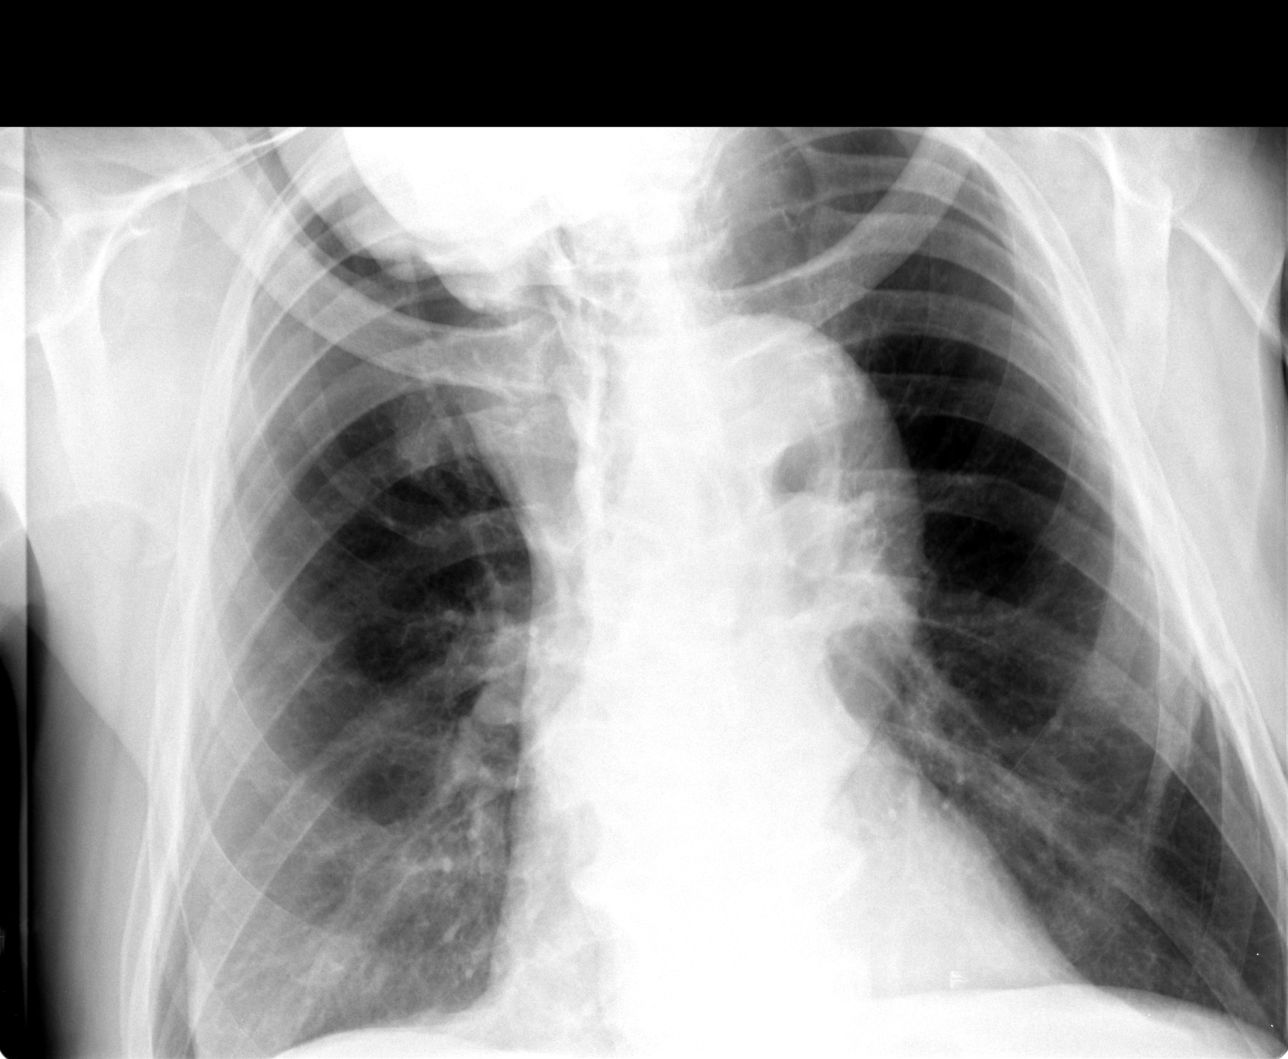

[view not recorded (2 of 3)]
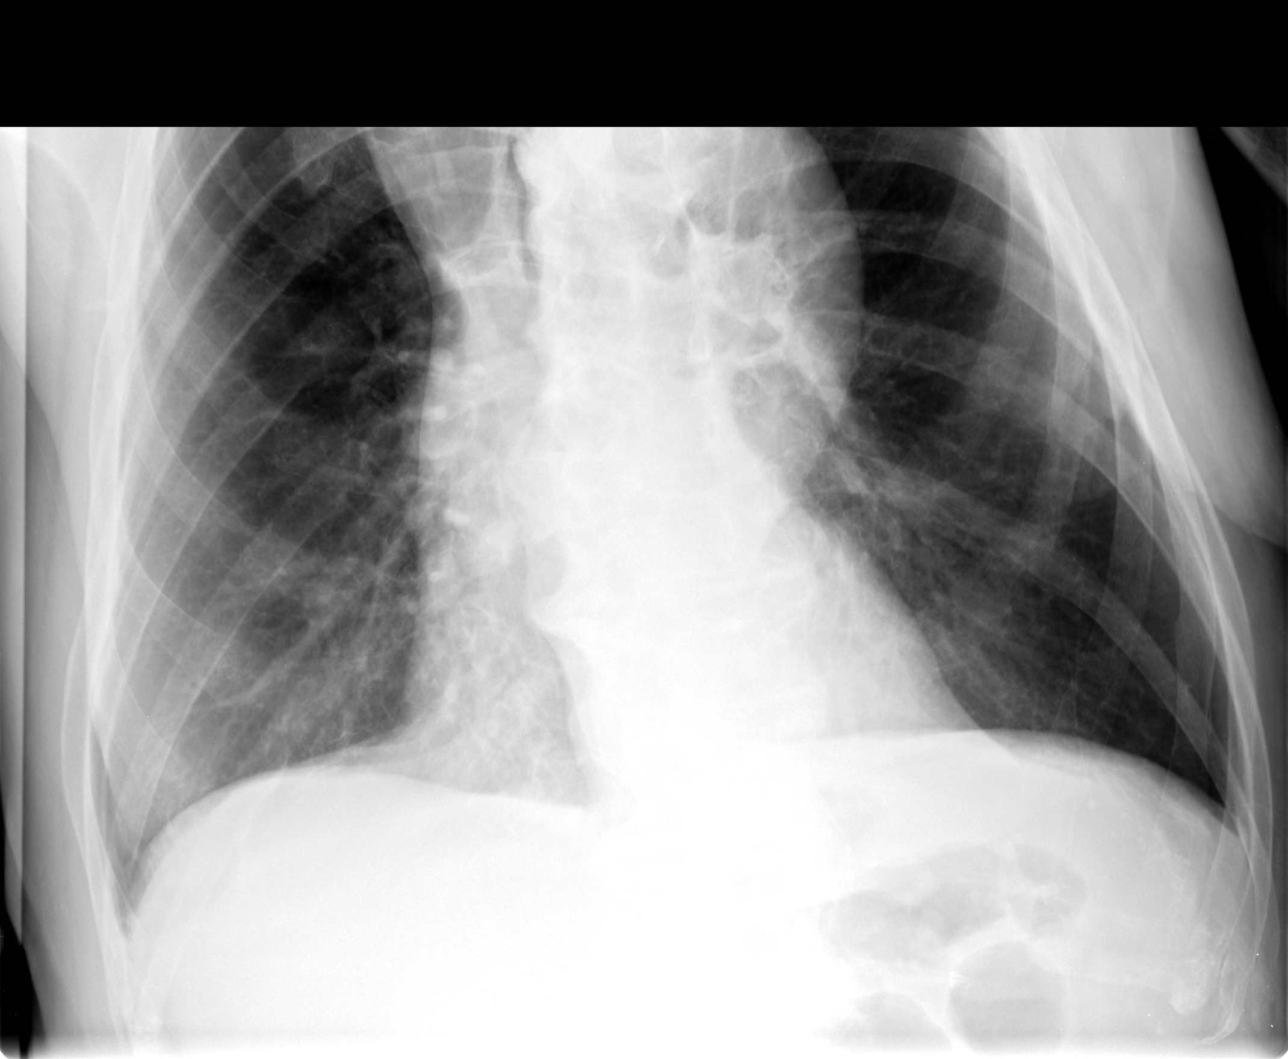

[view not recorded (3 of 3)]
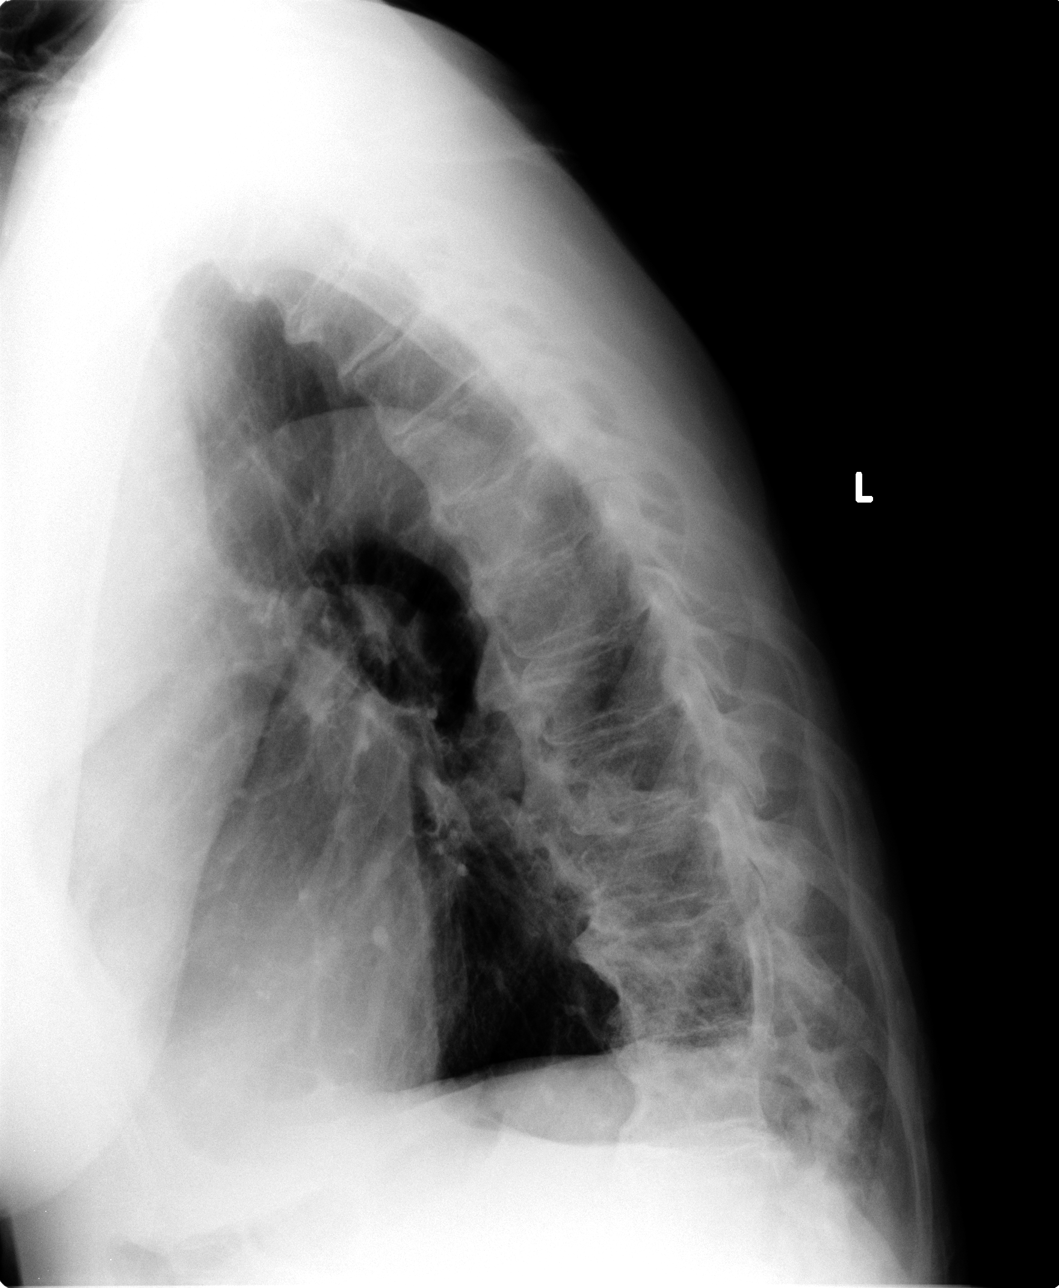

[3 of 3 positions shown; findings below may reference images not displayed]

FINDINGS: Lateral view degraded by patient arm position. Moderate to marked
thoracic spondylosis.

Patient rotated to the right on the frontal radiograph, with the
chin overlying the right apex. Midline trachea. Mild cardiomegaly
with a markedly tortuous thoracic aorta, accentuated by obliquity.
No pleural effusion or pneumothorax. No congestive failure. No lobar
consolidation.
IMPRESSION: Suboptimal patient positioning on all views.

Cardiomegaly with a markedly tortuous thoracic aorta. No acute
superimposed process.
